# Patient Record
Sex: Female | Born: 1985 | Race: Black or African American | Hispanic: No | Marital: Married | Smoking: Never smoker
Health system: Southern US, Community
[De-identification: ages and names within clinical notes are randomized; demographics above are authoritative.]

## PROBLEM LIST (undated history)

## (undated) ENCOUNTER — Inpatient Hospital Stay (HOSPITAL_COMMUNITY): Payer: Self-pay

## (undated) DIAGNOSIS — R87613 High grade squamous intraepithelial lesion on cytologic smear of cervix (HGSIL): Secondary | ICD-10-CM

## (undated) DIAGNOSIS — E669 Obesity, unspecified: Secondary | ICD-10-CM

## (undated) DIAGNOSIS — Z789 Other specified health status: Secondary | ICD-10-CM

## (undated) DIAGNOSIS — M545 Low back pain, unspecified: Secondary | ICD-10-CM

## (undated) HISTORY — DX: Obesity, unspecified: E66.9

## (undated) HISTORY — DX: High grade squamous intraepithelial lesion on cytologic smear of cervix (HGSIL): R87.613

## (undated) HISTORY — PX: NO PAST SURGERIES: SHX2092

## (undated) HISTORY — DX: Low back pain, unspecified: M54.50

---

## 2015-05-02 ENCOUNTER — Encounter (HOSPITAL_COMMUNITY): Payer: Self-pay | Admitting: Nurse Practitioner

## 2015-05-02 ENCOUNTER — Emergency Department (HOSPITAL_COMMUNITY)
Admission: EM | Admit: 2015-05-02 | Discharge: 2015-05-02 | Disposition: A | Discharge status: Home / Self Care | Payer: Self-pay | Attending: Emergency Medicine | Admitting: Emergency Medicine

## 2015-05-02 DIAGNOSIS — Z3202 Encounter for pregnancy test, result negative: Secondary | ICD-10-CM | POA: Insufficient documentation

## 2015-05-02 DIAGNOSIS — A64 Unspecified sexually transmitted disease: Secondary | ICD-10-CM | POA: Insufficient documentation

## 2015-05-02 LAB — WET PREP, GENITAL
CLUE CELLS WET PREP: NONE SEEN
TRICH WET PREP: NONE SEEN
WBC WET PREP: NONE SEEN
Yeast Wet Prep HPF POC: NONE SEEN

## 2015-05-02 LAB — URINALYSIS, ROUTINE W REFLEX MICROSCOPIC
Bilirubin Urine: NEGATIVE
Glucose, UA: NEGATIVE mg/dL
Hgb urine dipstick: NEGATIVE
Ketones, ur: 15 mg/dL — AB
LEUKOCYTES UA: NEGATIVE
NITRITE: NEGATIVE
PH: 6.5 (ref 5.0–8.0)
Protein, ur: NEGATIVE mg/dL
SPECIFIC GRAVITY, URINE: 1.027 (ref 1.005–1.030)
Urobilinogen, UA: 1 mg/dL (ref 0.0–1.0)

## 2015-05-02 LAB — PREGNANCY, URINE: PREG TEST UR: NEGATIVE

## 2015-05-02 MED ORDER — AZITHROMYCIN 250 MG PO TABS
1000.0000 mg | ORAL_TABLET | Freq: Once | ORAL | Status: AC
  Administered 2015-05-02: 1000 mg via ORAL
  Filled 2015-05-02: qty 4

## 2015-05-02 MED ORDER — CEFTRIAXONE SODIUM 250 MG IJ SOLR
250.0000 mg | Freq: Once | INTRAMUSCULAR | Status: AC
Start: 2015-05-02 — End: 2015-05-02
  Administered 2015-05-02: 250 mg via INTRAMUSCULAR
  Filled 2015-05-02: qty 250

## 2015-05-02 NOTE — ED Provider Notes (Signed)
CSN: [id number]     Arrival date & time 05/02/15  1108 History   First MD Initiated Contact with Patient 05/02/15 1127     Chief Complaint  Patient presents with  . Vaginal Discharge     (Consider location/radiation/quality/duration/timing/severity/associated sxs/prior Treatment) HPI Comments: 29 year-old female with no significant medical history presents with concern for STDs. Patient's husband is also symptomatic. Symptoms for the past few days. No known STD history. 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  Patient is a 29 y.o. female presenting with vaginal discharge. The history is provided by the patient.  Vaginal Discharge Associated symptoms: no abdominal pain, no dysuria, no fever and no vomiting     History reviewed. No pertinent past medical history. History reviewed. No pertinent past surgical history. History reviewed. No pertinent family history. Social History  Substance Use Topics  . Smoking status: Never Smoker   . Smokeless tobacco: None  . Alcohol Use: No   OB History    No data available     Review of Systems  Constitutional: Negative for fever and chills.  Respiratory: Negative for shortness of breath.   Cardiovascular: Negative for chest pain.  Gastrointestinal: Negative for vomiting and abdominal pain.  Genitourinary: Positive for vaginal discharge. Negative for dysuria and flank pain.  Musculoskeletal: Negative for back pain, neck pain and neck stiffness.  Skin: Negative for rash.  Neurological: Negative for light-headedness and headaches.      Allergies  Review of patient's allergies indicates no known allergies.  Home Medications   Prior to Admission medications   Not on File   BP 130/81 mmHg  Pulse 82  Temp(Src) 97.6 F (36.4 C) (Oral)  Resp 14  SpO2 100%  LMP 05/02/2015 (LMP Unknown) Physical Exam  Constitutional: She is oriented to person, place, and time. She appears well-developed and well-nourished.   HENT:  Head: Normocephalic and atraumatic.  Eyes: Right eye exhibits no discharge. Left eye exhibits no discharge.  Neck: No tracheal deviation present.  Cardiovascular: Normal rate.   Pulmonary/Chest: Effort normal.  Abdominal: Soft. She exhibits no distension. There is no tenderness. There is no guarding.  Genitourinary:  Mild vagin dc and CMT  Musculoskeletal: She exhibits no edema.  Neurological: She is alert and oriented to person, place, and time.  Skin: Skin is warm. No rash noted.  Psychiatric: She has a normal mood and affect.  Nursing note and vitals reviewed.   ED Course  Procedures (including critical care time) Labs Review Labs Reviewed  URINALYSIS, ROUTINE W REFLEX MICROSCOPIC (NOT AT [facility]) - Abnormal; Notable for the following:    APPearance CLOUDY (*)    Ketones, ur 15 (*)    All other components within normal limits  WET PREP, GENITAL  URINE CULTURE  PREGNANCY, URINE  RPR  HIV ANTIBODY (ROUTINE TESTING)  GC/CHLAMYDIA PROBE AMP ([place]) NOT AT [facility]    Imaging Review No results found. I have personally reviewed and evaluated these images and lab results as part of my medical decision-making.   EKG Interpretation None      MDM   Final diagnoses:  STD (sexually transmitted disease)   Patient with concern for STD. Treated prophylactically. Follow-up with local medicine and infectious disease. Case manager helping. Results and differential diagnosis were discussed with the patient/parent/guardian. Xrays were independently reviewed by myself.  Close follow up outpatient was discussed, comfortable with the plan.   Medications  cefTRIAXone (ROCEPHIN) injection 250 mg (250 mg Intramuscular Given 05/02/15 1228)  azithromycin [facility]) tablet 1,000 mg (1,000 mg Oral Given 05/02/15 1228)    Filed Vitals:   05/02/15 1123 05/02/15 1348  BP: 128/79 130/81  Pulse: 89 82  Temp: 98.2 F (36.8 C) 97.6 F (36.4 C)  TempSrc: Oral Oral  Resp: 16 14   SpO2: 98% 100%    Final diagnoses:  STD (sexually transmitted disease)        Blane Ohara, MD 05/02/15 1410

## 2015-05-02 NOTE — ED Notes (Addendum)
Horton Finer [phone] case manager will come pick patients up when ready

## 2015-05-02 NOTE — Discharge Instructions (Signed)
Follow up with infectious disease for culture results. If you were given medicines take as directed.  If you are on coumadin or contraceptives realize their levels and effectiveness is altered by many different medicines.  If you have any reaction (rash, tongues swelling, other) to the medicines stop taking and see a physician.    If your blood pressure was elevated in the ER make sure you follow up for management with a primary doctor or return for chest pain, shortness of breath or stroke symptoms.  Please follow up as directed and return to the ER or see a physician for new or worsening symptoms.  Thank you. Filed Vitals:   05/02/15 1123  BP: 128/79  Pulse: 89  Temp: 98.2 F (36.8 C)  TempSrc: Oral  Resp: 16  SpO2: 98%    Emergency Department Resource Guide 1) Find a Doctor and Pay Out of Pocket Although you won't have to find out who is covered by your insurance plan, it is a good idea to ask around and get recommendations. You will then need to call the office and see if the doctor you have chosen will accept you as a new patient and what types of options they offer for patients who are self-pay. Some doctors offer discounts or will set up payment plans for their patients who do not have insurance, but you will need to ask so you aren't surprised when you get to your appointment.  2) Contact Your Local Health Department Not all health departments have doctors that can see patients for sick visits, but many do, so it is worth a call to see if yours does. If you don't know where your local health department is, you can check in your phone book. The CDC also has a tool to help you locate your state's health department, and many state websites also have listings of all of their local health departments.  3) Find a Walk-in Clinic If your illness is not likely to be very severe or complicated, you may want to try a walk in clinic. These are popping up all over the country in pharmacies,  drugstores, and shopping centers. They're usually staffed by nurse practitioners or physician assistants that have been trained to treat common illnesses and complaints. They're usually fairly quick and inexpensive. However, if you have serious medical issues or chronic medical problems, these are probably not your best option.  No Primary Care Doctor: - Call Health Connect at  [phone] - they can help you locate a primary care doctor that  accepts your insurance, provides certain services, etc. - Physician Referral Service- [phone]  Chronic Pain Problems: Organization         Address  Phone   Notes  [facility]  [phone] Patients need to be referred by their primary care doctor.   Medication Assistance: Organization         Address  Phone   Notes  [facility] Medication [facility] [address] [place] [place], [place] [address] [phone] --Must be a resident of [facility] -- Must have NO insurance coverage whatsoever (no Medicaid/ Medicare, etc.) -- The pt. MUST have a primary care doctor that directs their care regularly and follows them in the community   MedAssist  [phone]   [employer]  [phone]    Agencies that provide inexpensive medical care: Organization         Address  Phone   Notes  [facility]  Family [facility]  [phone]) [phone]   [facility]    [phone]   [facility] [address] [place], [place] [address] [phone]) [phone]   [facility] [address], [place] [phone]   [facility]anned Parenthood    [phone]   [facility]    [phone](336) [phone]   [facility]  [address], [place] Phone:  [phone]) [phone], Fax:  [phone] Hours of Operation:  9 am - 6 pm, M-F.  Also accepts Medicaid/Medicare and self-pay.  [facility]  [address], [place] Phone:  [phone]) [phone], Fax: [phone]. Hours of Operation:  8:30 am - 5:30 pm, M-F.  Also accepts Medicaid and self-pay.  [facility] [place] [address], [place] Phone: [phone]) [phone]   [facility] [address], [place], [place] [phone], Ext. [phone] Mondays & Thursdays: 7-9 AM.  First 15 patients are seen on a first come, first serve basis.    Medicaid-accepting [facility] Providers:  Organization         Address  Phone   Notes  [facility] [address], [place] [phone]) [phone] Also accepts self-pay patients.  [facility] [address], [place]  [phone](336) [phone]   [facility] [address], [place] [phone](336) [phone]   [facility] [address] [place] [address], [place] [phone]) [phone]   Renaye RakersVeita Bland [address], [place]   [phone]) [phone] Only accepts [place] Access [place] patients after they have their name applied to their card.   Self-Pay (no insurance) in [facility]:  Organization         Address  Phone   Notes  Sickle Cell Patients, [facility] [address], [place] [phone]) [phone]   [facility] [address], [place] [phone]) [phone]   [facility] [place]  [address], [place] [phone]   [facility]/Dr. Osei-Bonsu  [address] [place] [address], [place] or [id number], [place] [phone]) [phone] Phone number for both [place] and [place] locations is the same.  [facility] [address], [place] [phone]) [phone]   [facility] [address] [place] [address], [place] or [address] [phone]) [phone]   [facility] [address], [place] [phone](336) [phone], phone; [phone]) [phone], fax Sees patients 1st and 3rd Saturday of every month.  Must not qualify for public  or private insurance (i.e. Medicaid, Medicare, [place] Health Choice, Veterans' Benefits)  Household income should be no more than 200% of the poverty level The clinic cannot treat you if you are pregnant or think you are pregnant  Sexually transmitted diseases are not treated at the clinic.    Dental Care: Organization         Address  Phone  Notes  [facility] [address], [place] [phone]) [phone] Accepts children up to age 31 who are enrolled in [place]icaid or [place] Health Choice; pregnant women with a Medicaid card; and children who have applied for Medicaid or [place] Health Choice, but were declined, whose parents can pay a reduced fee at time of service.  [facility] of [facility]  [place]  [address], [place] [phone]) [phone] Accepts children up to age 31 who are enrolled in Medicaid or [place] Health Choice; pregnant women with a Medicaid card; and children who have applied for Medicaid or [place] Health Choice, but were declined, whose parents can pay a reduced fee at time of service.  [facility]  [address], [place] [phone]) [phone] Patients are seen by appointment only. Walk-ins are not accepted. [facility] will see patients 31 years of age and older. Monday - Tuesday (8am-5pm) Most Wednesdays (8:30-5pm) $30 per visit, cash only  [facility]  [address], [facility] [phone]) [phone] Patients are seen by appointment only. Walk-ins are not accepted. [facility] will see patients 31 years of age and older. One Wednesday Evening (Monthly: Volunteer Based).  $30 per visit, cash only  [facility]  [phone]) [phone] for adults; Children under age 724, call Graduate Pediatric Dentistry at [phone]) [phone]. Children aged 184-14, please call [phone]) [phone] to request a pediatric application.  Dental services are provided in all areas of  dental care including fillings, crowns and bridges, complete and partial dentures, implants, gum treatment, root canals, and extractions. Preventive care is also provided. Treatment is provided to both adults and children. Patients are selected via a lottery and there is often a waiting list.   [facility] [address], [place]  [phone]) [phone] [email or web address]   [facility] [address], [place], [place] [phone], Ext. [phone] Second and Fourth Thursday of each month, opens at 6:30 AM; Clinic ends at 9 AM.  Patients are seen on a first-come first-served basis, and a limited number are seen during each clinic.   [facility]  [address], [place], [place] [phone]) [phone]   Eligibility Requirements You must have lived in [place], [place], or [place] counties for at least the last three months.   You cannot be eligible for state or federal sponsored National Cityhealthcare insurance, including CIGNAVeterans Administration, [place]icaid, or [employer]Medicare.   You generally cannot be eligible for healthcare insurance through your employer.    How to apply: Eligibility screenings are held every Tuesday and Wednesday afternoon from 1:00 pm until 4:00 pm. You do not need an appointment for the interview!  [facility] [address], [place], [place] [phone]   [facility] [place] [facility]  [phone]   [facility]  [phone]   [facility]  [phone]    Behavioral Health Resources in the Community: Intensive Outpatient Programs Organization         Address  Phone  Notes  [facility] [address], [place], [place] [phone]   [facility] [address], [place], [place] [phone]   [facility] [address], [place], [place]  [phone]   [facility] [address],  [place], [place] [phone] or  [phone]   Substance Abuse Resources Organization         Address  Phone  Notes  [facility] Drug Services  [phone]   [facility]  [phone]   [facility]  [phone]   [facility]  [phone]   Residential & Outpatient Substance Abuse Program  [phone]   Psychological Services Organization         Address  Phone  Notes  [facility]  [phone]  [phone]   [facility]  [phone]   [facility] [address] [place] [phone] or [phone]    Mobile Crisis Teams Organization         Address  Phone  Notes  [facility], Mobile Crisis Care Unit  [phone]   [facility]  [address] [place], [place] [phone]   Doristine Locks [address] [place] [phone]    Self-Help/Support Groups Organization         Address  Phone             Notes  Mental Health Assoc. of  - variety of support groups  336- I7437963 Call for more information  Narcotics Anonymous (NA), [facility]aring [facility] [address], [employer] [place]  2 meetings at this location   Statistician         Address  Phone  Notes  [facility] [address],    [place]  [phone]   [facility]  [address] [place] [address], [place], [place] [phone]   [facility] [address] [place], [place] [phone] Admissions: 8am-3pm M-F  [facility] [address]    [place], [place] [phone]   [facility] [address] [place], [place], [place] [phone]   [facility] [address],  [place], [place] [phone]   [facility] - Intensive Outpatient [address], [place], [place] [phone]   [facility] ([facility] [address] [place]  [place], [place] [phone] or [phone]   Residential  Treatment Services (RTS) [address], [place], [place] [phone] Accepts Medicaid  [facility] [address],  [place] [phone] Substance Abuse/Addiction Treatment   [facility] Organization         Address  Phone  Notes  [facility]  [phone]   Angie Fava, PhD [address] [place] [employer] [employer], [place]   [phone] or [phone]   [facility]   [address] [place], [place] [phone]   [facility] [address] [place], [place] [phone] Insurance/Medicaid/sponsorship through [facility] and Families [address]                                    [place], [place] [phone] Therapy/tele-psych/case  [facility] [address], [place] [phone]    Dr. Lolly Mustache  [phone]   [facility]  [employer] [facility] Dept. 1) [address] [place] 2) [address], [place] 3)  [address], [place] [phone]  [phone]   [facility] Child Abuse Hotline (986)742-5806 or [phone] (After Hours)

## 2015-05-02 NOTE — ED Notes (Signed)
She c/o painful intercourse, vaginal discharge and urinary burning x 3 days. She denies pain right now

## 2015-05-03 LAB — RPR: RPR: NONREACTIVE

## 2015-05-03 LAB — HIV ANTIBODY (ROUTINE TESTING W REFLEX): HIV SCREEN 4TH GENERATION: NONREACTIVE

## 2015-05-04 LAB — URINE CULTURE

## 2015-05-05 LAB — GC/CHLAMYDIA PROBE AMP (~~LOC~~) NOT AT ARMC
Chlamydia: POSITIVE — AB
NEISSERIA GONORRHEA: NEGATIVE

## 2015-05-06 ENCOUNTER — Telehealth (HOSPITAL_COMMUNITY): Payer: Self-pay | Admitting: Emergency Medicine

## 2015-05-06 NOTE — Telephone Encounter (Signed)
Positive Chlamydia Treated with Rocephin and Zithromax DHHS faxed  Unable to contact patient by phone, no number given in epic. Letter sent.

## 2015-05-14 ENCOUNTER — Telehealth (HOSPITAL_BASED_OUTPATIENT_CLINIC_OR_DEPARTMENT_OTHER): Payer: Self-pay | Admitting: Emergency Medicine

## 2015-05-14 NOTE — Telephone Encounter (Signed)
Letter returned to sender, no known forwarding address, lost to followup

## 2015-05-22 ENCOUNTER — Telehealth (HOSPITAL_COMMUNITY): Payer: Self-pay

## 2015-05-22 NOTE — Telephone Encounter (Signed)
Unable to contact pt by mail or telephone. Unable to communicate lab results or treatment changes. 

## 2016-07-18 ENCOUNTER — Emergency Department (HOSPITAL_COMMUNITY): Payer: Medicaid Other

## 2016-07-18 ENCOUNTER — Encounter (HOSPITAL_COMMUNITY): Payer: Self-pay | Admitting: *Deleted

## 2016-07-18 ENCOUNTER — Emergency Department (HOSPITAL_COMMUNITY)
Admission: EM | Admit: 2016-07-18 | Discharge: 2016-07-18 | Disposition: A | Discharge status: Home / Self Care | Payer: Medicaid Other | Attending: Emergency Medicine | Admitting: Emergency Medicine

## 2016-07-18 DIAGNOSIS — R102 Pelvic and perineal pain: Secondary | ICD-10-CM

## 2016-07-18 DIAGNOSIS — O0281 Inappropriate change in quantitative human chorionic gonadotropin (hCG) in early pregnancy: Secondary | ICD-10-CM | POA: Diagnosis not present

## 2016-07-18 DIAGNOSIS — Z3A18 18 weeks gestation of pregnancy: Secondary | ICD-10-CM | POA: Diagnosis not present

## 2016-07-18 DIAGNOSIS — O26892 Other specified pregnancy related conditions, second trimester: Secondary | ICD-10-CM | POA: Diagnosis present

## 2016-07-18 DIAGNOSIS — O210 Mild hyperemesis gravidarum: Secondary | ICD-10-CM | POA: Insufficient documentation

## 2016-07-18 DIAGNOSIS — O219 Vomiting of pregnancy, unspecified: Secondary | ICD-10-CM

## 2016-07-18 DIAGNOSIS — O26899 Other specified pregnancy related conditions, unspecified trimester: Secondary | ICD-10-CM

## 2016-07-18 LAB — COMPREHENSIVE METABOLIC PANEL
ALT: 13 U/L — ABNORMAL LOW (ref 14–54)
AST: 25 U/L (ref 15–41)
Albumin: 3.3 g/dL — ABNORMAL LOW (ref 3.5–5.0)
Alkaline Phosphatase: 63 U/L (ref 38–126)
Anion gap: 9 (ref 5–15)
BILIRUBIN TOTAL: 0.5 mg/dL (ref 0.3–1.2)
BUN: 8 mg/dL (ref 6–20)
CHLORIDE: 108 mmol/L (ref 101–111)
CO2: 19 mmol/L — ABNORMAL LOW (ref 22–32)
CREATININE: 0.57 mg/dL (ref 0.44–1.00)
Calcium: 9.2 mg/dL (ref 8.9–10.3)
Glucose, Bld: 110 mg/dL — ABNORMAL HIGH (ref 65–99)
POTASSIUM: 3.2 mmol/L — AB (ref 3.5–5.1)
Sodium: 136 mmol/L (ref 135–145)
TOTAL PROTEIN: 7.1 g/dL (ref 6.5–8.1)

## 2016-07-18 LAB — CBC
HEMATOCRIT: 35.5 % — AB (ref 36.0–46.0)
Hemoglobin: 12.5 g/dL (ref 12.0–15.0)
MCH: 28.3 pg (ref 26.0–34.0)
MCHC: 35.2 g/dL (ref 30.0–36.0)
MCV: 80.3 fL (ref 78.0–100.0)
PLATELETS: 152 10*3/uL (ref 150–400)
RBC: 4.42 MIL/uL (ref 3.87–5.11)
RDW: 13.3 % (ref 11.5–15.5)
WBC: 11.4 10*3/uL — AB (ref 4.0–10.5)

## 2016-07-18 LAB — URINALYSIS, ROUTINE W REFLEX MICROSCOPIC
Bilirubin Urine: NEGATIVE
GLUCOSE, UA: NEGATIVE mg/dL
Hgb urine dipstick: NEGATIVE
LEUKOCYTES UA: NEGATIVE
NITRITE: NEGATIVE
PROTEIN: NEGATIVE mg/dL
Specific Gravity, Urine: 1.012 (ref 1.005–1.030)
pH: 6.5 (ref 5.0–8.0)

## 2016-07-18 LAB — I-STAT BETA HCG BLOOD, ED (MC, WL, AP ONLY): I-stat hCG, quantitative: >2000 m[IU]/mL — ABNORMAL HIGH (ref <5)

## 2016-07-18 LAB — HCG, QUANTITATIVE, PREGNANCY: HCG, BETA CHAIN, QUANT, S: 22475 m[IU]/mL — AB (ref <5)

## 2016-07-18 LAB — LIPASE, BLOOD: LIPASE: 28 U/L (ref 11–51)

## 2016-07-18 MED ORDER — PYRIDOXINE HCL 25 MG PO TABS: 25.0000 mg | ORAL_TABLET | Freq: Three times a day (TID) | ORAL | 0 refills | Status: DC

## 2016-07-18 MED ORDER — MECLIZINE HCL 25 MG PO TABS: 25.0000 mg | ORAL_TABLET | Freq: Three times a day (TID) | ORAL | 1 refills | Status: DC | PRN

## 2016-07-18 MED ORDER — DIPHENHYDRAMINE HCL 25 MG PO TABS: 25.0000 mg | ORAL_TABLET | Freq: Four times a day (QID) | ORAL | 1 refills | Status: DC | PRN

## 2016-07-18 MED ORDER — DIPHENHYDRAMINE HCL 50 MG/ML IJ SOLN
25.0000 mg | Freq: Once | INTRAMUSCULAR | Status: AC
  Administered 2016-07-18: 25 mg via INTRAVENOUS
  Filled 2016-07-18: qty 1

## 2016-07-18 MED ORDER — METOCLOPRAMIDE HCL 5 MG/ML IJ SOLN
10.0000 mg | Freq: Once | INTRAMUSCULAR | Status: AC
  Administered 2016-07-18: 10 mg via INTRAVENOUS
  Filled 2016-07-18: qty 2

## 2016-07-18 MED ORDER — SODIUM CHLORIDE 0.9 % IV BOLUS (SEPSIS)
1000.0000 mL | Freq: Once | INTRAVENOUS | Status: AC
  Administered 2016-07-18: 1000 mL via INTRAVENOUS

## 2016-07-18 MED ORDER — PYRIDOXINE HCL 100 MG/ML IJ SOLN
100.0000 mg | Freq: Once | INTRAMUSCULAR | Status: AC
  Administered 2016-07-18: 100 mg via INTRAVENOUS
  Filled 2016-07-18: qty 1

## 2016-07-18 MED ORDER — MECLIZINE HCL 25 MG PO TABS
25.0000 mg | ORAL_TABLET | Freq: Once | ORAL | Status: AC
  Administered 2016-07-18: 25 mg via ORAL
  Filled 2016-07-18: qty 1

## 2016-07-18 NOTE — ED Notes (Signed)
Patient transported to Ultrasound 

## 2016-07-18 NOTE — ED Triage Notes (Signed)
Uses translator service, pt reports being approx 4 months pregnant and having severe n/v. Pt is fatigued and having abd pain, is afraid that she will have miscarriage. This is pt's 5th pregnancy.

## 2016-07-18 NOTE — ED Provider Notes (Signed)
[facility]-EMERGENCY DEPT Provider Note   CSN: [id number] Arrival date & time: 07/18/16  1512     History   Chief Complaint Chief Complaint  Patient presents with  . Emesis  . Abdominal Pain    HPI Deborah Santana is a 30 y.o. female.  Deborah Santana is a 30 y.o. Female who presents to the ED complaining of LLQ abdominal pain, nausea and vomiting since today. She reports she is about 4 months pregnant she believes. Her last menstrual cycle was July 20th of this year. She reports no prenatal care and has had no confirmed IUP. She reports today she began having nausea and vomiting and left sided lower abdominal pain. She denies any vaginal bleeding or vaginal discharge. She tells me she believes she will have a miscarriage. She does report her pain came on gradually. She reports multiple episodes of nausea and vomiting. Patient denies fevers, urinary symptoms, vaginal bleeding, vaginal discharge,coughing or rashes.    The history is provided by the patient and the spouse. The history is limited by a language barrier. A language interpreter was used.  Emesis   Associated symptoms include abdominal pain. Pertinent negatives include no cough, no diarrhea, no fever and no headaches.  Abdominal Pain   Associated symptoms include nausea and vomiting. Pertinent negatives include fever, diarrhea, dysuria, frequency, hematuria and headaches.    History reviewed. No pertinent past medical history.  There are no active problems to display for this patient.   History reviewed. No pertinent surgical history.  OB History    Gravida Para Term Preterm AB Living   5             SAB TAB Ectopic Multiple Live Births                   Home Medications    Prior to Admission medications   Medication Sig Start Date End Date Taking? Authorizing Provider  diphenhydrAMINE (BENADRYL) 25 MG tablet Take 1 tablet (25 mg total) by mouth every 6 (six) hours as needed (Rash). 07/18/16   Everlene FarrierWilliam Lexys Milliner, PA-C    meclizine (ANTIVERT) 25 MG tablet Take 1 tablet (25 mg total) by mouth 3 (three) times daily as needed for nausea. 07/18/16   Everlene FarrierWilliam Ashey Tramontana, PA-C  pyridOXINE (VITAMIN B-6) 25 MG tablet Take 1 tablet (25 mg total) by mouth 3 (three) times daily. 07/18/16   Everlene FarrierWilliam Sudeep Scheibel, PA-C    Family History History reviewed. No pertinent family history.  Social History Social History  Substance Use Topics  . Smoking status: Never Smoker  . Smokeless tobacco: Not on file  . Alcohol use No     Allergies   Patient has no known allergies.   Review of Systems Review of Systems  Constitutional: Negative for fever.  HENT: Negative for congestion and sore throat.   Eyes: Negative for visual disturbance.  Respiratory: Negative for cough and shortness of breath.   Cardiovascular: Negative for chest pain.  Gastrointestinal: Positive for abdominal pain, nausea and vomiting. Negative for blood in stool and diarrhea.  Genitourinary: Negative for dysuria, frequency, hematuria, urgency, vaginal bleeding and vaginal discharge.  Musculoskeletal: Negative for back pain and neck pain.  Skin: Negative for rash.  Neurological: Negative for headaches.     Physical Exam Updated Vital Signs BP 112/67   Pulse 84   Temp 98.1 F (36.7 C) (Oral)   Resp 18   SpO2 100%   Physical Exam  Constitutional: She appears well-developed and well-nourished. No distress.  Nontoxic  appearing.  HENT:  Head: Normocephalic and atraumatic.  Mouth/Throat: Oropharynx is clear and moist.  Eyes: Conjunctivae are normal. Pupils are equal, round, and reactive to light. Right eye exhibits no discharge. Left eye exhibits no discharge.  Neck: Neck supple.  Cardiovascular: Normal rate, regular rhythm, normal heart sounds and intact distal pulses.  Exam reveals no gallop and no friction rub.   No murmur heard. Pulmonary/Chest: Effort normal and breath sounds normal. No respiratory distress. She has no wheezes. She has no rales.   Abdominal: Soft. Bowel sounds are normal. She exhibits mass. She exhibits no distension. There is tenderness.  Abdomen is soft. Bowel sounds are present. Patient has a gravid abdomen with fundus height just below the umbilicus. There is mild LLQ TTP. No RLQ TTP.   Musculoskeletal: She exhibits no edema.  Lymphadenopathy:    She has no cervical adenopathy.  Neurological: She is alert. Coordination normal.  Skin: Skin is warm and dry. No rash noted. She is not diaphoretic. No erythema. No pallor.  Psychiatric: She has a normal mood and affect. Her behavior is normal.  Nursing note and vitals reviewed.    ED Treatments / Results  Labs (all labs ordered are listed, but only abnormal results are displayed) Labs Reviewed  COMPREHENSIVE METABOLIC PANEL - Abnormal; Notable for the following:       Result Value   Potassium 3.2 (*)    CO2 19 (*)    Glucose, Bld 110 (*)    Albumin 3.3 (*)    ALT 13 (*)    All other components within normal limits  CBC - Abnormal; Notable for the following:    WBC 11.4 (*)    HCT 35.5 (*)    All other components within normal limits  URINALYSIS, ROUTINE W REFLEX MICROSCOPIC (NOT AT [facility]) - Abnormal; Notable for the following:    APPearance CLOUDY (*)    Ketones, ur >80 (*)    All other components within normal limits  HCG, QUANTITATIVE, PREGNANCY - Abnormal; Notable for the following:    hCG, Beta Chain, Quant, S 22,475 (*)    All other components within normal limits  I-STAT BETA HCG BLOOD, ED ([facility], [facility], [facility] ONLY) - Abnormal; Notable for the following:    I-stat hCG, quantitative >2,000.0 (*)    All other components within normal limits  LIPASE, BLOOD    EKG  EKG Interpretation None       Radiology US Ob Limited  Result Date: 07/18/2016 CLINICAL DATA:  Pelvic pain with nausea and vomiting. Second trimester pregnancy. EXAM: LIMITED OBSTETRIC ULTRASOUND FINDINGS: Number of Fetuses:  1 Heart Rate:  150 bpm Movement:  Yes Presentation: Variable  Placental Location: Posterior Previa: No Amniotic Fluid (Subjective):  Within normal limits. BPD:  4.0cm 18w  0d MATERNAL FINDINGS: Cervix: Appears closed. Measures 4.1 cm in length transabdominally. Uterus/Adnexae:  No abnormality visualized. IMPRESSION: Single living intrauterine fetus at approximately 18 weeks. No acute maternal findings visualized. This exam is performed on an emergent basis and does not comprehensively evaluate fetal size, dating, or anatomy; follow-up complete OB US should be considered if further fetal assessment is warranted. Electronically Signed   By: Myles Rosenthal M.D.   On: 07/18/2016 18:54    Procedures Procedures (including critical care time)  Medications Ordered in ED Medications  sodium chloride 0.9 % bolus 1,000 mL (0 mLs Intravenous Stopped 07/18/16 2031)  diphenhydrAMINE (BENADRYL) injection 25 mg (25 mg Intravenous Given 07/18/16 1651)  metoCLOPramide (REGLAN) injection 10 mg (10  mg Intravenous Given 07/18/16 1651)  pyridOXINE (B-6) injection 100 mg (100 mg Intravenous Given 07/18/16 2028)  sodium chloride 0.9 % bolus 1,000 mL (0 mLs Intravenous Stopped 07/18/16 2205)  meclizine (ANTIVERT) tablet 25 mg (25 mg Oral Given 07/18/16 2028)     Initial Impression / Assessment and Plan / ED Course  I have reviewed the triage vital signs and the nursing notes.  Pertinent labs & imaging results that were available during my care of the patient were reviewed by me and considered in my medical decision making (see chart for details).  Clinical Course    This is a 30 y.o. Female who presents to the ED complaining of LLQ abdominal pain, nausea and vomiting since today. She reports she is about 4 months pregnant she believes. Her last menstrual cycle was July 20th of this year. She reports no prenatal care and has had no confirmed IUP. She reports today she began having nausea and vomiting and left sided lower abdominal pain. She denies any vaginal bleeding or vaginal  discharge. She tells me she believes she will have a miscarriage. She does report her pain came on gradually. She reports multiple episodes of nausea and vomiting. Patient denies fevers, urinary symptoms, vaginal bleeding, vaginal discharge.  On exam the patient is afebrile nontoxic appearing. She has a gravid abdomen with her fundal height just below the level of her umbilicus. She has some mild left lower quadrant abdominal tenderness to palpation. No CVA or flank tenderness. Intrauterine pregnancy confirmed OB ultrasound that shows a single live intrauterine fetus at approximately [redacted] weeks gestation. No acute maternal findings visualized. CBC is remarkable only for white count of 11,000. His CMP is unremarkable. Lipase is within normal limits. Patient received Benadryl, Reglan and a fluid bolus initially to help with her nausea. At recheck patient is still having some vomiting. Patient was then provided with vitamin B 6 and second fluid bolus.  At recheck patient is finally feeling better. She reports decreased appetite but is able to tolerate by mouth. We will discharge her with vitamin B6 and Benadryl for nausea. I encouraged her to follow-up with the women's outpatient clinic for recheck. I discussed return precautions. I advised the patient to follow-up with their primary care provider this week. I advised the patient to return to the emergency department with new or worsening symptoms or new concerns. The patient verbalized understanding and agreement with plan.    This patient was discussed with and evaluated by Dr. Dalene SeltzerSchlossman who agrees with assessment and plan.   Final Clinical Impressions(s) / ED Diagnoses   Final diagnoses:  Hyperemesis gravidarum  [redacted] weeks gestation of pregnancy    New Prescriptions Discharge Medication List as of 07/18/2016 11:29 PM    START taking these medications   Details  meclizine (ANTIVERT) 25 MG tablet Take 1 tablet (25 mg total) by mouth 3 (three) times  daily as needed for nausea., Starting Sun 07/18/2016, Print    pyridOXINE (VITAMIN B-6) 25 MG tablet Take 1 tablet (25 mg total) by mouth 3 (three) times daily., Starting Sun 07/18/2016, Print         Everlene FarrierWilliam Jumana Paccione, PA-C 07/19/16 [id number]    Alvira MondayErin Schlossman, MD 07/19/16 1250

## 2016-07-18 NOTE — ED Notes (Signed)
Pt vomiting; meclizine pill visible in emesis bag; Will PA  notified

## 2016-08-11 ENCOUNTER — Encounter: Payer: Self-pay | Admitting: Obstetrics and Gynecology

## 2016-08-25 ENCOUNTER — Encounter: Payer: Self-pay | Admitting: Family Medicine

## 2016-08-25 ENCOUNTER — Other Ambulatory Visit (HOSPITAL_COMMUNITY)
Admission: RE | Admit: 2016-08-25 | Discharge: 2016-08-25 | Disposition: A | Discharge status: Home / Self Care | Payer: Medicaid Other | Source: Ambulatory Visit | Attending: Obstetrics and Gynecology | Admitting: Obstetrics and Gynecology

## 2016-08-25 ENCOUNTER — Encounter: Payer: Self-pay | Admitting: Obstetrics and Gynecology

## 2016-08-25 ENCOUNTER — Ambulatory Visit (INDEPENDENT_AMBULATORY_CARE_PROVIDER_SITE_OTHER): Payer: Medicaid Other | Admitting: Obstetrics and Gynecology

## 2016-08-25 DIAGNOSIS — Z113 Encounter for screening for infections with a predominantly sexual mode of transmission: Secondary | ICD-10-CM | POA: Insufficient documentation

## 2016-08-25 DIAGNOSIS — Z3492 Encounter for supervision of normal pregnancy, unspecified, second trimester: Secondary | ICD-10-CM

## 2016-08-25 DIAGNOSIS — Z1151 Encounter for screening for human papillomavirus (HPV): Secondary | ICD-10-CM | POA: Diagnosis present

## 2016-08-25 DIAGNOSIS — Z23 Encounter for immunization: Secondary | ICD-10-CM | POA: Diagnosis present

## 2016-08-25 DIAGNOSIS — Z3490 Encounter for supervision of normal pregnancy, unspecified, unspecified trimester: Secondary | ICD-10-CM | POA: Insufficient documentation

## 2016-08-25 DIAGNOSIS — Z01411 Encounter for gynecological examination (general) (routine) with abnormal findings: Secondary | ICD-10-CM | POA: Insufficient documentation

## 2016-08-25 LAB — POCT URINALYSIS DIP (DEVICE)
Bilirubin Urine: NEGATIVE
GLUCOSE, UA: NEGATIVE mg/dL
HGB URINE DIPSTICK: NEGATIVE
Ketones, ur: NEGATIVE mg/dL
LEUKOCYTES UA: NEGATIVE
NITRITE: NEGATIVE
PROTEIN: NEGATIVE mg/dL
Specific Gravity, Urine: 1.015 (ref 1.005–1.030)
UROBILINOGEN UA: 0.2 mg/dL (ref 0.0–1.0)
pH: 7.5 (ref 5.0–8.0)

## 2016-08-25 NOTE — Progress Notes (Signed)
Here for first prenatal visit. Used [employer].

## 2016-08-25 NOTE — Progress Notes (Signed)
New OB Note  08/25/2016   [facility]  Chief Complaint: NOB  Transfer of Care Patient: no  History of Present Illness: Ms. Deborah Santana is a 30 y.o. [id number] @ 23/3 weeks (EDC 4/8, based on 18wk u/s) Patient's last menstrual period was 03/26/2016.).  Preg complicated by has Supervision of low-risk pregnancy on her problem list.   Her periods were: regular, qmonth She was using no method when she conceived.  She has Negative signs or symptoms of nausea/vomiting of pregnancy. She has Negative signs or symptoms of miscarriage or preterm labor  ROS: A 12-point review of systems was performed and negative, except as stated in the above HPI.  OBGYN History: As per HPI. OB History  Gravida Para Term Preterm AB Living  5 4 4     4   SAB TAB Ectopic Multiple Live Births          4    # Outcome Date GA Lbr Len/2nd Weight Sex Delivery Anes PTL Lv  5 Current           4 Term 2015 5566w0d  6 lb 2.8 oz (2.8 kg)  Vag-Spont   LIV     Birth Comments: born in [place], no complications.   3 Term 2011 7666w0d  7 lb 11.5 oz (3.5 kg)  Vag-Spont   LIV     Birth Comments: born in [place],no complications  2 Term 2008 5066w0d  8 lb 6 oz (3.8 kg)  Vag-Spont   LIV     Birth Comments: born in [place], no complications.  1 Term 2006 3866w0d  8 lb 9.6 oz (3.9 kg)  Vag-Spont   LIV     Birth Comments: no complications, born in [place]     Any issues with any prior pregnancies: no Any prior children are healthy, doing well, without any problems or issues: yes   Past Medical History: No past medical history on file.  Past Surgical History: History reviewed. No pertinent surgical history.  Family History:  History reviewed. No pertinent family history.  Social History:  Social History   Social History  . Marital status: Married    Spouse name: N/A  . Number of children: N/A  . Years of education: N/A   Occupational History  . Not on file.   Social History Main Topics  . Smoking  status: Never Smoker  . Smokeless tobacco: Never Used  . Alcohol use No  . Drug use: No  . Sexual activity: Yes    Birth control/ protection: None   Other Topics Concern  . Not on file   Social History Narrative  . No narrative on file   Works at [employer] on her feet a lot  Allergy: No Known Allergies  Health Maintenance:  Mammogram Up to Date: not applicable  Current Outpatient Medications: PNV  Physical Exam:   BP (!) 111/49   Pulse 71   Wt 161 lb (73 kg)   LMP 03/26/2016  There is no height or weight on file to calculate BMI. Fundal height: 25 FHTs: 160  General appearance: Well nourished, well developed female in no acute distress.  Neck:  Supple, normal appearance, and no thyromegaly  Cardiovascular: S1, S2 normal, no murmur, rub or gallop, regular rate and rhythm Respiratory:  Clear to auscultation bilateral. Normal respiratory effort Abdomen: positive bowel sounds and no masses, hernias; diffusely non tender to palpation, non distended Breasts: breasts appear normal, no suspicious masses, no skin or nipple changes or axillary  nodes, and normal inspection. Neuro/Psych:  Normal mood and affect.  Skin:  Warm and dry.  Lymphatic:  No inguinal lymphadenopathy.   Pelvic exam: is not limited by body habitus EGBUS: within normal limits, Vagina: within normal limits and with no blood in the vault, Cervix: normal appearing cervix without discharge or lesions, closed/long/high, Uterus:  enlarged, c/w 24 week size, and Adnexa:  normal adnexa and no mass, fullness, tenderness  Laboratory: none  Imaging:  18wk u/s reviewed  Assessment: pt doing well  Plan: 1. Encounter for supervision of low-risk pregnancy, antepartum Routine care. 2hr GTT nv. Pt told to be fasting. Anatomy u/s scheduled for next week. Flu shot today - Prenatal Profile - Pain Mgmt, Profile 6 Conf w/o mM, U - Hemoglobinopathy Evaluation - GC/Chlamydia probe amp ([place] - Flu  Vaccine QUAD 36+ mos IM - Culture, OB Urine - US MFM OB COMP + 14 WK; Future - Cystic fibrosis diagnostic study - Cytology - PAP  Interpreter used.   Problem list reviewed and updated.  Follow up in 3-4 weeks.  >50% of 25 min visit spent on counseling and coordination of care.     Cornelia Copaharlie Bassam Dresch, Jr. MD Attending [facility](Faculty Practice)

## 2016-08-26 LAB — PRENATAL PROFILE (SOLSTAS)
ANTIBODY SCREEN: NEGATIVE
BASOS PCT: 0 %
Basophils Absolute: 0 cells/uL (ref 0–200)
EOS ABS: 288 {cells}/uL (ref 15–500)
Eosinophils Relative: 4 %
HEMATOCRIT: 33.1 % — AB (ref 35.0–45.0)
HEMOGLOBIN: 11.1 g/dL — AB (ref 11.7–15.5)
HIV 1&2 Ab, 4th Generation: NONREACTIVE
Hepatitis B Surface Ag: NEGATIVE
Lymphocytes Relative: 28 %
Lymphs Abs: 2016 cells/uL (ref 850–3900)
MCH: 28.3 pg (ref 27.0–33.0)
MCHC: 33.5 g/dL (ref 32.0–36.0)
MCV: 84.4 fL (ref 80.0–100.0)
MONOS PCT: 8 %
MPV: 11.2 fL (ref 7.5–12.5)
Monocytes Absolute: 576 cells/uL (ref 200–950)
NEUTROS ABS: 4320 {cells}/uL (ref 1500–7800)
Neutrophils Relative %: 60 %
PLATELETS: 150 10*3/uL (ref 140–400)
RBC: 3.92 MIL/uL (ref 3.80–5.10)
RDW: 14.3 % (ref 11.0–15.0)
Rh Type: POSITIVE
Rubella: 16.2 Index — ABNORMAL HIGH (ref <0.90)
WBC: 7.2 10*3/uL (ref 3.8–10.8)

## 2016-08-26 LAB — PAIN MGMT, PROFILE 6 CONF W/O MM, U
6 Acetylmorphine: NEGATIVE ng/mL (ref <10)
ALCOHOL METABOLITES: NEGATIVE ng/mL (ref <500)
Amphetamines: NEGATIVE ng/mL (ref <500)
Barbiturates: NEGATIVE ng/mL (ref <300)
Benzodiazepines: NEGATIVE ng/mL (ref <100)
Cocaine Metabolite: NEGATIVE ng/mL (ref <150)
Creatinine: 115.4 mg/dL (ref >=20.0)
MARIJUANA METABOLITE: NEGATIVE ng/mL (ref <20)
Methadone Metabolite: NEGATIVE ng/mL (ref <100)
OXIDANT: NEGATIVE ug/mL (ref <200)
Opiates: NEGATIVE ng/mL (ref <100)
Oxycodone: NEGATIVE ng/mL (ref <100)
PH: 7.19 (ref 4.5–9.0)
PLEASE NOTE: 0
Phencyclidine: NEGATIVE ng/mL (ref <25)

## 2016-08-26 LAB — GC/CHLAMYDIA PROBE AMP (~~LOC~~) NOT AT ARMC
CHLAMYDIA, DNA PROBE: NEGATIVE
NEISSERIA GONORRHEA: NEGATIVE

## 2016-08-26 LAB — CULTURE, OB URINE: Organism ID, Bacteria: NO GROWTH

## 2016-08-27 ENCOUNTER — Encounter: Payer: Self-pay | Admitting: Obstetrics and Gynecology

## 2016-08-27 DIAGNOSIS — Z789 Other specified health status: Secondary | ICD-10-CM | POA: Insufficient documentation

## 2016-08-27 DIAGNOSIS — Z603 Acculturation difficulty: Secondary | ICD-10-CM | POA: Insufficient documentation

## 2016-08-27 LAB — HEMOGLOBINOPATHY EVALUATION
HCT: 33.1 % — ABNORMAL LOW (ref 35.0–45.0)
HGB A2 QUANT: 2.9 % (ref 1.8–3.5)
Hemoglobin: 11.1 g/dL — ABNORMAL LOW (ref 11.7–15.5)
Hgb A: 96.1 % (ref >96.0)
MCH: 28.3 pg (ref 27.0–33.0)
MCV: 84.4 fL (ref 80.0–100.0)
RBC: 3.92 MIL/uL (ref 3.80–5.10)
RDW: 14.3 % (ref 11.0–15.0)

## 2016-08-30 LAB — CYTOLOGY - PAP
DIAGNOSIS: UNDETERMINED — AB
HPV: DETECTED — AB

## 2016-08-30 LAB — CYSTIC FIBROSIS DIAGNOSTIC STUDY

## 2016-08-31 ENCOUNTER — Encounter: Payer: Self-pay | Admitting: Obstetrics and Gynecology

## 2016-08-31 DIAGNOSIS — R87610 Atypical squamous cells of undetermined significance on cytologic smear of cervix (ASC-US): Secondary | ICD-10-CM | POA: Insufficient documentation

## 2016-08-31 DIAGNOSIS — R87810 Cervical high risk human papillomavirus (HPV) DNA test positive: Secondary | ICD-10-CM

## 2016-08-31 DIAGNOSIS — R87613 High grade squamous intraepithelial lesion on cytologic smear of cervix (HGSIL): Secondary | ICD-10-CM | POA: Insufficient documentation

## 2016-09-01 ENCOUNTER — Other Ambulatory Visit: Payer: Self-pay | Admitting: Obstetrics and Gynecology

## 2016-09-01 ENCOUNTER — Ambulatory Visit (HOSPITAL_COMMUNITY)
Admission: RE | Admit: 2016-09-01 | Discharge: 2016-09-01 | Disposition: A | Discharge status: Home / Self Care | Payer: Medicaid Other | Source: Ambulatory Visit | Attending: Obstetrics and Gynecology | Admitting: Obstetrics and Gynecology

## 2016-09-01 DIAGNOSIS — Z3A24 24 weeks gestation of pregnancy: Secondary | ICD-10-CM

## 2016-09-01 DIAGNOSIS — Z3490 Encounter for supervision of normal pregnancy, unspecified, unspecified trimester: Secondary | ICD-10-CM

## 2016-09-01 DIAGNOSIS — Z363 Encounter for antenatal screening for malformations: Secondary | ICD-10-CM | POA: Insufficient documentation

## 2016-09-13 NOTE — L&D Delivery Note (Signed)
Delivery Note Admitted in active labor w/ SROM. Progressed rapidly and pushed x 1 uc and at 6:28 AM a viable female was delivered via Vaginal, Spontaneous Delivery (Presentation:LOA). Shoulders were not forthcoming, pt placed in Mc[employer]'s then suprapubic pressure which released anterior shoulder. Shoulder dystocia lasted ~30 seconds. Infant then placed on maternal abdomen and dried/stimulated, poor tone/color/resp so cord clamped x 2 and cut by me and infant taken to radiant warmer- spontaneously cried in transit with resulting improvement in tone/resp, requiring some blow-by O2 to keep sats up. Moving both arms bilaterally.  APGAR: see delivery summary. Weight: pending at time of note .   Placenta status: delivered spontaneously intact, marginal insertion.  Cord: 3VC,  with the following complications: none.  Cord pH: not done  Anesthesia:  1 dose IV fentanyl ~7mins prior to birth Episiotomy: None Lacerations: None Suture Repair: n/a Est. Blood Loss (mL): 50  Mom to postpartum.  Baby to Couplet care / Skin to Skin. Breastfeeding, plans depo  Marge Duncans 12/22/2016, 6:36 AM

## 2016-09-15 ENCOUNTER — Encounter: Payer: Self-pay | Admitting: Advanced Practice Midwife

## 2016-09-15 ENCOUNTER — Ambulatory Visit (INDEPENDENT_AMBULATORY_CARE_PROVIDER_SITE_OTHER): Payer: Medicaid Other | Admitting: Advanced Practice Midwife

## 2016-09-15 VITALS — BP 106/50 | HR 82 | Wt 171.0 lb

## 2016-09-15 DIAGNOSIS — Z3492 Encounter for supervision of normal pregnancy, unspecified, second trimester: Secondary | ICD-10-CM

## 2016-09-15 DIAGNOSIS — R87810 Cervical high risk human papillomavirus (HPV) DNA test positive: Secondary | ICD-10-CM

## 2016-09-15 DIAGNOSIS — Z3482 Encounter for supervision of other normal pregnancy, second trimester: Secondary | ICD-10-CM

## 2016-09-15 DIAGNOSIS — R87610 Atypical squamous cells of undetermined significance on cytologic smear of cervix (ASC-US): Secondary | ICD-10-CM

## 2016-09-15 MED ORDER — COMFORT FIT MATERNITY SUPP MED MISC: 1.0000 | Freq: Every day | 0 refills | Status: DC

## 2016-09-15 NOTE — Patient Instructions (Signed)
[facility]  [address]  [place], [place] [address]  Colposcopy Colposcopy is a procedure to examine the lowest part of the uterus (cervix), the vagina, and the area around the vaginal opening (vulva) for abnormalities or signs of disease. The procedure is done using a lighted microscope or magnifying lens (colposcope). If any unusual cells are found during the procedure, your health care provider may remove a tissue sample for testing (biopsy). A colposcopy may be done if you:  Have an abnormal Pap test. A Pap test is a screening test that is used to check for signs of cancer or infection of the vagina, cervix, and uterus.  Have a Pap smear test in which you test positive for high-risk HPV (human papillomavirus).  Have a sore or lesion on your cervix.  Have genital warts on your vulva, vagina, or cervix.  Took certain medicines while pregnant, such as diethylstilbestrol (DES).  Have pain during sexual intercourse.  Have vaginal bleeding, especially after sexual intercourse.  Need to have a cervical polyp removed.  Need to have a lost intrauterine device (IUD) string located. Let your health care provider know about:  Any allergies you have, including allergies to prescribed medicine, latex, or iodine.  All medicines you are taking, including vitamins, herbs, eye drops, creams, and over-the-counter medicines. Bring a list of all of your medicines to your appointment.  Any problems you or family members have had with anesthetic medicines.  Any blood disorders you have.  Any surgeries you have had.  Any medical conditions you have, such as pelvic inflammatory disease (PID) or endometrial disorder.  Any history of frequent fainting.  Your menstrual cycle and what form of birth control (contraception) you use.  Your medical history, including any prior cervical treatment.  Whether you are pregnant or may be pregnant. What are the risks? Generally, this is a safe  procedure. However, problems may occur, including:  Pain.  Infection, which may include a fever, bad-smelling discharge, or pelvic pain.  Bleeding or discharge.  Misdiagnosis.  Fainting and vasovagal reactions, but this is rare.  Allergic reactions to medicines.  Damage to other structures or organs. What happens before the procedure?  If you have your menstrual period or will have it at the time of your procedure, tell your health care provider. A colposcopy typically is not done during menstruation.  Continue your contraceptive practices before and after the procedure.  For 24 hours before the colposcopy:  Do not douche.  Do not use tampons.  Do not use medicines, creams, or suppositories in the vagina.  Do not have sexual intercourse.  Ask your health care provider about:  Changing or stopping your regular medicines. This is especially important if you are taking diabetes medicines or blood thinners.  Taking medicines such as aspirin and ibuprofen. These medicines can thin your blood. Do not take these medicines before your procedure if your health care provider instructs you not to. It is likely that your health care provider will tell you to avoid taking aspirin or medicine that contains aspirin for 7 days before the procedure.  Follow instructions from your health care provider about eating or drinking restrictions. You will likely need to eat a regular diet the day of the procedure and not skip any meals.  You may have an exam or testing. A pregnancy test will be taken on the day of the procedure.  You may have a blood or urine sample taken.  Plan to have someone take you  home from the hospital or clinic.  If you will be going home right after the procedure, plan to have someone with you for 24 hours. What happens during the procedure?  You will lie down on your back, with your feet in foot rests (stirrups).  A warmed and lubricated instrument (speculum) will  be inserted into your vagina. The speculum will be used to hold apart the walls of your vagina so your health care provider can see your cervix and the inside of your vagina.  A cotton swab will be used to place a small amount of liquid solution on the areas to be examined. This solution makes it easier to see abnormal cells. You may feel a slight burning during this part.  The colposcope will be used to scan the cervix with a bright white light. The colposcope will be held near your vulvaand will magnify your vulva, vagina, and cervix for easier examination.  Your health care provider may decide to take a biopsy. If so:  You may be given medicine to numb the area (local anesthetic).  Surgical instruments will be used to suck out mucus and cells through your vagina.  You may feel mild pain while the tissue sample is removed.  Bleeding may occur. A solution may be used to stop the bleeding.  If a sample of tissue is needed from the inside of the cervix, a different procedure called endocervical curettage (ECC) may be completed. During this procedure, a curved instrument (curette) will be used to scrape cells from your cervix or the top of your cervix (endocervix).  Your health care provider will record the location of any abnormalities. The procedure may vary among health care providers and hospitals. What happens after the procedure?  You will lie down and rest for a few minutes. You may be offered juice or cookies.  Your blood pressure, heart rate, breathing rate, and blood oxygen level will be monitored until any medicines you were given have worn off.  You may have to wear compression stockings. These stockings help to prevent blood clots and reduce swelling in your legs.  You may have some cramping in your abdomen. This should go away after a few minutes. This information is not intended to replace advice given to you by your health care provider. Make sure you discuss any questions  you have with your health care provider. Document Released: 11/20/2002 Document Revised: 04/27/2016 Document Reviewed: 04/05/2016 Elsevier Interactive Patient Education  2017 [employer]

## 2016-09-15 NOTE — Progress Notes (Signed)
Pt having 2 hr gtt today. Pt needs to be informed of abnormal pap smear and need for colpo.

## 2016-09-15 NOTE — Progress Notes (Signed)
   PRENATAL VISIT NOTE  Subjective:  Deborah Santana is a 31 y.o. G5P4004 at 2875w3d being seen today for ongoing prenatal care.  She is currently monitored for the following issues for this low-risk pregnancy and has Supervision of low-risk pregnancy; Language barrier; and ASCUS with positive high risk HPV cervical on her problem list.  Patient reports no complaints.  Contractions: Not present. Vag. Bleeding: None.  Movement: Present. Denies leaking of fluid.   The following portions of the patient's history were reviewed and updated as appropriate: allergies, current medications, past family history, past medical history, past social history, past surgical history and problem list. Problem list updated.  Objective:   Vitals:   09/15/16 0922  BP: (!) 106/50  Pulse: 82  Weight: 171 lb (77.6 kg)    Fetal Status: Fetal Heart Rate (bpm): 146 Fundal Height: 27 cm Movement: Present     General:  Alert, oriented and cooperative. Patient is in no acute distress.  Skin: Skin is warm and dry. No rash noted.   Cardiovascular: Normal heart rate noted  Respiratory: Normal respiratory effort, no problems with respiration noted  Abdomen: Soft, gravid, appropriate for gestational age. Pain/Pressure: Absent     Pelvic:  Cervical exam performed        Extremities: Normal range of motion.  Edema: None  Mental Status: Normal mood and affect. Normal behavior. Normal judgment and thought content.   Assessment and Plan:  Pregnancy: G5P4004 at 3975w3d  1. Encounter for supervision of other normal pregnancy in second trimester  - 2Hr GTT w/ 1 Hr Carpenter 75 g  2. ASCUS with positive high risk HPV cervical - Informed of need for Colpo  3. Encounter for supervision of low-risk pregnancy in second trimester   Preterm labor symptoms and general obstetric precautions including but not limited to vaginal bleeding, contractions, leaking of fluid and fetal movement were reviewed in detail with the  patient. Please refer to After Visit Summary for other counseling recommendations.  Return in about 2 weeks (around 09/29/2016) for ROB and Colpo.   [name], CNM

## 2016-09-16 LAB — 2HR GTT W 1 HR, CARPENTER, 75 G
GLUCOSE, 1 HR, GEST: 107 mg/dL (ref <180)
GLUCOSE, FASTING, GEST: 67 mg/dL (ref 65–91)
Glucose, 2 Hr, Gest: 103 mg/dL (ref <153)

## 2016-09-28 ENCOUNTER — Encounter: Payer: Self-pay | Admitting: Obstetrics and Gynecology

## 2016-09-28 ENCOUNTER — Ambulatory Visit (INDEPENDENT_AMBULATORY_CARE_PROVIDER_SITE_OTHER): Payer: Medicaid Other | Admitting: Obstetrics and Gynecology

## 2016-09-28 DIAGNOSIS — Z23 Encounter for immunization: Secondary | ICD-10-CM | POA: Diagnosis not present

## 2016-09-28 DIAGNOSIS — R87610 Atypical squamous cells of undetermined significance on cytologic smear of cervix (ASC-US): Secondary | ICD-10-CM

## 2016-09-28 DIAGNOSIS — Z3492 Encounter for supervision of normal pregnancy, unspecified, second trimester: Secondary | ICD-10-CM

## 2016-09-28 DIAGNOSIS — R87810 Cervical high risk human papillomavirus (HPV) DNA test positive: Secondary | ICD-10-CM | POA: Diagnosis not present

## 2016-09-28 NOTE — Addendum Note (Signed)
Addended by: Cheree DittoGRAHAM, Kabe Mckoy A on: 09/28/2016 10:07 AM   Modules accepted: Orders

## 2016-09-28 NOTE — Progress Notes (Signed)
   PRENATAL VISIT NOTE  Subjective:  Deborah Santana is a 31 y.o. G5P4004 at 6865w2d being seen today for ongoing prenatal care.  She is currently monitored for the following issues for this low-risk pregnancy and has Supervision of low-risk pregnancy; Language barrier; and ASCUS with positive high risk HPV cervical on her problem list.  Patient reports Patient complains of some occassional left hip pain only happens when she stands for a long time. .  Contractions: Not present. Vag. Bleeding: None.  Movement: Present. Denies leaking of fluid.   The following portions of the patient's history were reviewed and updated as appropriate: allergies, current medications, past family history, past medical history, past social history, past surgical history and problem list. Problem list updated.  Objective:   Vitals:   09/28/16 0849  BP: 109/64  Pulse: 91  Weight: 175 lb (79.4 kg)    Fetal Status: Fetal Heart Rate (bpm): 143   Movement: Present     General:  Alert, oriented and cooperative. Patient is in no acute distress.  Skin: Skin is warm and dry. No rash noted.   Cardiovascular: Normal heart rate noted  Respiratory: Normal respiratory effort, no problems with respiration noted  Abdomen: Soft, gravid, appropriate for gestational age. Pain/Pressure: Present     Pelvic:  Cervical exam deferred        Extremities: Normal range of motion.  Edema: Trace  Mental Status: Normal mood and affect. Normal behavior. Normal judgment and thought content.    GYNECOLOGY CLINIC COLPOSCOPY VISIT AND PROCEDURE NOTE  31 y.o. [id number] here for colposcopy for ASCUS with POSITIVE high risk HPV pap smear on 08/25/2016. Prior cervical cytology and/or colposcopy findings: none. The patient reports the following prior treatments to the vulva/vagina/cervix: none. The patient not a cigarette smoker. The patient not immunosuppressed. The patient is pregnant. The patient not taking anticoagulants and not allergic to  iodine.  Patient given informed consent, signed copy in the chart, time out was performed. Urine pregnancy test performed and confirmed to be negative.  Placed in lithotomy position.  Gross findings:   Vagina: normal  Vulva: normal   Cervix: normal  Visualization after:  Acetic acid:  No abnormal uptake, unable to see entire SCJ  Lugol's solution: no abnormal uptake unable to see entire SCJ  Green or blue filter: no abnormal vessels  Physical Exam  Genitourinary:     Biopsies obtained: none  ECC specimen obtained: defered due to pregnancy status  Colposcopy adequate? No    Patient was given post procedure instructions.  Will follow up pathology and manage accordingly.      Shonna ChockNoah Wouk, MD OB/GYN Fellow   Assessment and Plan:  Pregnancy: [phone] at 5665w2d  1. Encounter for supervision of low-risk pregnancy in second trimester -Tdap today, otherwise uptoday -no complaints  2. ASCUS with positive high risk HPV cervical -Colposcopy today inadequate.  -unable to see the entire SCJ. -plan to repeat colpo post partum  Preterm labor symptoms and general obstetric precautions including but not limited to vaginal bleeding, contractions, leaking of fluid and fetal movement were reviewed in detail with the patient. Please refer to After Visit Summary for other counseling recommendations.  No Follow-up on file.   Lorne SkeensNicholas Michael Anaia Frith, MD

## 2016-09-28 NOTE — Patient Instructions (Signed)
Colposcopy, Care After This sheet gives you information about how to care for yourself after your procedure. Your doctor may also give you more specific instructions. If you have problems or questions, contact your doctor. What can I expect after the procedure? If you did not have a tissue sample removed (did not have a biopsy), you may only have some spotting for a few days. You can go back to your normal activities. If you had a tissue sample removed, it is common to have:  Soreness and pain. This may last for a few days.  Light-headedness.  Mild bleeding from your vagina or dark-colored, grainy discharge from your vagina. This may last for a few days. You may need to wear a sanitary pad.  Spotting for at least 48 hours after the procedure. Follow these instructions at home:  Take over-the-counter and prescription medicines only as told by your doctor. Ask your doctor what medicines you can start taking again. This is very important if you take blood-thinning medicine.  Do not drive or use heavy machinery while taking prescription pain medicine.  For 3 days, or as long as your doctor tells you, avoid:  Douching.  Using tampons.  Having sex.  If you use birth control (contraception), keep using it.  Limit activity for the first day after the procedure. Ask your doctor what activities are safe for you.  It is up to you to get the results of your procedure. Ask your doctor when your results will be ready.  Keep all follow-up visits as told by your doctor. This is important. Contact a doctor if:  You get a skin rash. Get help right away if:  You are bleeding a lot from your vagina. It is a lot of bleeding if you are using more than one pad an hour for 2 hours in a row.  You have clumps of blood (blood clots) coming from your vagina.  You have a fever.  You have chills  You have pain in your lower belly (pelvic area).  You have signs of infection, such as vaginal  discharge that is:  Different than usual.  Yellow.  Bad-smelling.  You have very pain or cramps in your lower belly that do not get better with medicine.  You feel light-headed.  You feel dizzy.  You pass out (faint). Summary  If you did not have a tissue sample removed (did not have a biopsy), you may only have some spotting for a few days. You can go back to your normal activities.  If you had a tissue sample removed, it is common to have mild pain and spotting for 48 hours.  For 3 days, or as long as your doctor tells you, avoid douching, using tampons and having sex.  Get help right away if you have bleeding, very bad pain, or signs of infection. This information is not intended to replace advice given to you by your health care provider. Make sure you discuss any questions you have with your health care provider. Document Released: 02/16/2008 Document Revised: 05/19/2016 Document Reviewed: 05/19/2016 Elsevier Interactive Patient Education  2017 [employer]  

## 2016-10-21 ENCOUNTER — Ambulatory Visit (INDEPENDENT_AMBULATORY_CARE_PROVIDER_SITE_OTHER): Payer: Medicaid Other | Admitting: Obstetrics and Gynecology

## 2016-10-21 VITALS — BP 110/48 | HR 75 | Wt 179.5 lb

## 2016-10-21 DIAGNOSIS — Z3493 Encounter for supervision of normal pregnancy, unspecified, third trimester: Secondary | ICD-10-CM

## 2016-10-21 DIAGNOSIS — Z789 Other specified health status: Secondary | ICD-10-CM

## 2016-10-21 LAB — CBC
HEMATOCRIT: 30.2 % — AB (ref 35.0–45.0)
Hemoglobin: 10.2 g/dL — ABNORMAL LOW (ref 11.7–15.5)
MCH: 27.8 pg (ref 27.0–33.0)
MCHC: 33.8 g/dL (ref 32.0–36.0)
MCV: 82.3 fL (ref 80.0–100.0)
MPV: 11.4 fL (ref 7.5–12.5)
Platelets: 148 10*3/uL (ref 140–400)
RBC: 3.67 MIL/uL — ABNORMAL LOW (ref 3.80–5.10)
RDW: 13.9 % (ref 11.0–15.0)
WBC: 8 10*3/uL (ref 3.8–10.8)

## 2016-10-21 NOTE — Patient Instructions (Signed)
Back Pain in Pregnancy Introduction Back pain during pregnancy is common. Back pain may be caused by several factors that are related to changes during your pregnancy. Follow these instructions at home: Managing pain, stiffness, and swelling  If directed, apply ice for sudden (acute) back pain.  Put ice in a plastic bag.  Place a towel between your skin and the bag.  Leave the ice on for 20 minutes, 2-3 times per day.  If directed, apply heat to the affected area before you exercise:  Place a towel between your skin and the heat pack or heating pad.  Leave the heat on for 20-30 minutes.  Remove the heat if your skin turns bright red. This is especially important if you are unable to feel pain, heat, or cold. You may have a greater risk of getting burned. Activity  Exercise as told by your health care provider. Exercising is the best way to prevent or manage back pain.  Listen to your body when lifting. If lifting hurts, ask for help or bend your knees. This uses your leg muscles instead of your back muscles.  Squat down when picking up something from the floor. Do not bend over.  Only use bed rest as told by your health care provider. Bed rest should only be used for the most severe episodes of back pain. Standing, Sitting, and Lying Down  Do not stand in one place for long periods of time.  Use good posture when sitting. Make sure your head rests over your shoulders and is not hanging forward. Use a pillow on your lower back if necessary.  Try sleeping on your side, preferably the left side, with a pillow or two between your legs. If you are sore after a night's rest, your bed may be too soft. A firm mattress may provide more support for your back during pregnancy. General instructions  Do not wear high heels.  Eat a healthy diet. Try to gain weight within your health care provider's recommendations.  Use a maternity girdle, elastic sling, or back brace as told by your  health care provider.  Take over-the-counter and prescription medicines only as told by your health care provider.  Keep all follow-up visits as told by your health care provider. This is important. This includes any visits with any specialists, such as a physical therapist. Contact a health care provider if:  Your back pain interferes with your daily activities.  You have increasing pain in other parts of your body. Get help right away if:  You develop numbness, tingling, weakness, or problems with the use of your arms or legs.  You develop severe back pain that is not controlled with medicine.  You have a sudden change in bowel or bladder control.  You develop shortness of breath, dizziness, or you faint.  You develop nausea, vomiting, or sweating.  You have back pain that is a rhythmic, cramping pain similar to labor pains. Labor pain is usually 1-2 minutes apart, lasts for about 1 minute, and involves a bearing down feeling or pressure in your pelvis.  You have back pain and your water breaks or you have vaginal bleeding.  You have back pain or numbness that travels down your leg.  Your back pain developed after you fell.  You develop pain on one side of your back.  You see blood in your urine.  You develop skin blisters in the area of your back pain. This information is not intended to replace advice given to   you by your health care provider. Make sure you discuss any questions you have with your health care provider. Document Released: 12/08/2005 Document Revised: 02/05/2016 Document Reviewed: 05/14/2015  2017 Elsevier  

## 2016-10-21 NOTE — Progress Notes (Signed)
   PRENATAL VISIT NOTE  Subjective:  Deborah Santana is a 31 y.o. G5P4004 at 273w4d being seen today for ongoing prenatal care.  She is currently monitored for the following issues for this low-risk pregnancy and has Supervision of low-risk pregnancy; Language barrier; and ASCUS with positive high risk HPV cervical on her problem list.  Patient reports backache. Patient does report back and mild abdominal pain after standing all day at her job. She is only occasionally using the pregnancy support belt. She has not tried taking tylenol on the days that her pain is worse. She has no other complaints.  Contractions: Not present.  .  Movement: Present. Denies leaking of fluid.   The following portions of the patient's history were reviewed and updated as appropriate: allergies, current medications, past family history, past medical history, past social history, past surgical history and problem list. Problem list updated.  Objective:   Vitals:   10/21/16 1337  BP: (!) 110/48  Pulse: 75  Weight: 179 lb 8 oz (81.4 kg)    Fetal Status: Fetal Heart Rate (bpm): 145   Movement: Present     General:  Alert, oriented and cooperative. Patient is in no acute distress.  Skin: Skin is warm and dry. No rash noted.   Cardiovascular: Normal heart rate noted  Respiratory: Normal respiratory effort, no problems with respiration noted  Abdomen: Soft, gravid, appropriate for gestational age. Pain/Pressure: Present     Pelvic:  Cervical exam deferred        Extremities: Normal range of motion.     Mental Status: Normal mood and affect. Normal behavior. Normal judgment and thought content.   Assessment and Plan:  Pregnancy: G5P4004 at 113w4d  1. Encounter for supervision of low-risk pregnancy in third trimester Advised PRN tylenol for back pain - CBC - RPR  2. Language barrier In person translator used.  Preterm labor symptoms and general obstetric precautions including but not limited to vaginal bleeding,  contractions, leaking of fluid and fetal movement were reviewed in detail with the patient. Please refer to After Visit Summary for other counseling recommendations.  No Follow-up on file.   Lorne SkeensNicholas Michael Hykeem Ojeda, MD

## 2016-10-22 LAB — RPR

## 2016-11-04 ENCOUNTER — Ambulatory Visit (INDEPENDENT_AMBULATORY_CARE_PROVIDER_SITE_OTHER): Payer: Medicaid Other | Admitting: Advanced Practice Midwife

## 2016-11-04 VITALS — BP 112/59 | HR 84 | Wt 178.7 lb

## 2016-11-04 DIAGNOSIS — Z789 Other specified health status: Secondary | ICD-10-CM

## 2016-11-04 DIAGNOSIS — O1203 Gestational edema, third trimester: Secondary | ICD-10-CM

## 2016-11-04 DIAGNOSIS — Z3493 Encounter for supervision of normal pregnancy, unspecified, third trimester: Secondary | ICD-10-CM

## 2016-11-04 MED ORDER — T.E.D. BELOW KNEE/L-REGULAR MISC: 1.0000 | Freq: Every day | 0 refills | Status: DC

## 2016-11-04 NOTE — Patient Instructions (Addendum)
For TED hose/socks:   [facility] [address] [place], [place] [address]  Phone: [phone]  [facility] [address] [place], [place], [place] [address]  Phone: [phone]   Edema Edema ni buildp isiyo ya [place] ya maji [place] tishu za [place]. Edema ni tegemezi fulani juu ya mvuto wa kuvuta maji kwenye sehemu ya chini kabisa katika mwili wako. Hiyo inafanya hali ya [place] zaidi [place] miguu na mapaja (chini ya mwisho). Uvimbe usio na ubongo wa miguu na vidole ni kawaida na inakuwa uwezekano mkubwa zaidi [place]. Erie Noe ni Murray Hodgkins tishu za [place], kama [place] macho [place]. Wakati eneo lililoathirika limefanywa, maji yanaweza kutoka nje ya eneo hilo na kuondoka kwa dakika kwa muda mfupi. Dent hii inaitwa pitting. Sababu ni nini? Kuna sababu nyingi za uwezekano wa edema. Kula chumvi nyingi na kuwa juu ya miguu yako au [place] kwa muda mrefu kunaweza kusababisha edema katika miguu yako na vidole. Hali ya hewa ya hewa inaweza kusababisha edema mbaya zaidi. Sababu za kawaida za matibabu za edema ni pamoja na: Moyo kushindwa [employer]ild[employer]. Ugonjwa wa ini. Ugonjwa wa figo. Mishipa ya damu dhaifu [employer]. Saratani. Truman Hayward. Mimba. Baadhi ya dawa. Uzito. Je! Ni ishara au dalili? Edema mara nyingi haipungukani. Ngozi yako inaweza kuangalia [employer]. Je, hii imegunduliwaje? Mtoa huduma wako wa afya anaweza kutambua edema kwa Libyan Arab Jamahiriya kuhusu historia yako ya matibabu na kufanya mtihani wa kimwili. Unahitaji kuwa na vipimo kama vile X-rays, electrocardiogram, au vipimo vya damu ili kuangalia hali ya matibabu ambayo inaweza kusababisha edema. Je, hii inatibiwaje? Tiba ya Edema inategemea sababu. Barnett Hatter moyo, ini, au [place] wa [place], unahitaji matibabu inayofaa kwa hali hizi. Matibabu ya jumla yanaweza kujumuisha: [place]o wa sehemu ya mwili iliyoathirika juu ya kiwango cha moyo wako. Ukandamizaji wa sehemu ya mwili  iliyoathirika. Shinikizo kutoka bandages ya elastic au soksi ya msaada hupunguza tishu na nguvu za maji nyuma kwenye mishipa ya damu. Hii inachukua maji ya [place] kwenye tishu. Uzuiaji wa ulaji wa maji na chumvi. Matumizi ya kidonge cha maji (diuretic). Madawa haya yanafaa tu kwa aina fulani za edema. [place] maji nje ya mwili wako na kukufanya urinate mara nyingi zaidi. Hii inachukua maji na hupunguza uvimbe, lakini diuretics inaweza kuwa na madhara. Tumia tu diuretiki kama ilivyoagizwa na mtoa huduma wako wa afya. Fuata maelekezo haya nyumbani: Weka sehemu ya mwili iliyoathirika juu ya kiwango cha moyo wako unapokuwa umelala. Usiketi au kusimama kwa muda mrefu. Usiweke chochote moja kwa moja chini ya magoti yako [place]. Doree Albee nguo au vitambaa kwenye miguu yako ya juu. Zoezi miguu yako kufanya kazi ya maji nyuma kwenye mishipa yako ya damu. Hii inaweza kusaidia uvimbe kwenda chini. Kuvaa bandages ya ustaafu au soksi za usaidizi ili [place] wa mguu kama ilivyoagizwa na mtoa huduma wako wa afya. Kula chakula cha chini cha chumvi ili kupunguza maji kama mtoa huduma wako wa afya anapendekeza. Tu Henrene Pastor dawa kama ilivyoagizwa na mtoa huduma wako wa afya. Wasiliana na mtoa huduma ya afya ikiwa: Edema yako haijibu kwa matibabu. Rockne Menghini, ini, au [place] wa figo na dalili za taarifa za edema. Una edema [place] miguu yako ambayo haina kuboresha baada Wallace Going. Una faida ya uzito ghafla na isiyojulikana. Pata msaada mara moja ikiwa: Unaendeleza kupunguzwa kwa pumzi au maumivu ya kifua. Huwezi kupumua wakati ulala. Unaendeleza maumivu, upekundu, au joto [place]. Rockne Menghini, ini, au [place] wa figo na ghafla kupata edema. Una homa na dalili zako huwa mbaya zaidi. Habari hii haikusudiwa [place]union nafasi  ya ushauri uliotolewa na mtoa huduma wako wa afya. Hakikisha kujadili maswali yoyote unayo na mtoa huduma wako wa afya. Hati iliyotolewa: 08/30/2005 Hati  iliyorekebishwa: 02/05/2016 Hati Iliyotathmini: 06/22/2013 Elsevier Interactive Patient Education  2017 [employer]     Trimester ya tatu Tye Savoyya ujauzito The trimester ya tatu ni kutoka wiki 29 hadi wiki 40 (miezi 7 hadi 9). Trimester ya tatu ni wakati ambapo mtoto asiyezaliwa (fetus) anaongezeka kwa kasi. Mwishoni mwa mwezi wa tisa, fetusi inakaribia urefu wa inchi 20 na inakuja safu ya 6-10. Mwili mabadiliko wakati wa trimester yako ya tatu Mwili wako unapita kupitia mabadiliko mengi wakati wa ujauzito. Mabadiliko hutofautiana kutoka mwanamke hadi mwanamke. Dina RichWakati wa tatu ya trimester: Elvia CollumUzito wako utaendelea kuongezeka. Unaweza kutarajia kupata pounds 25-35 (11-16 kg) mwishoni mwa ujauzito. Tomasa RandUnaweza kuanza kupata alama za Fairfieldkunyoosha kwenye vidonda vyako, tumbo, na matiti. Trinidad CuretUnaweza Milus Heightkukimbia kwa mara nyingi kwa sababu fetusi inapita chini ndani ya pelvis yako na kuimarisha kinga yako. Lowella DandyUnaweza kuendeleza au kuendelea kuwa na upungufu wa moyo. Hii inasababishwa na homoni zilizoongezeka zinazopungua misuli katika njia ya utumbo. Lowella DandyUnaweza kuendeleza au Hillery Aldokuendelea kuwa na kuvimbiwa kwa sababu homoni zilizoongezeka hupunguza digestion na kusababisha misuli inayochochea taka Guineakupitia matumbo Greenlandyako kupumzika. Lowella DandyUnaweza kuendeleza hemorrhoids. Hizi ni mishipa ya kuvimba (mishipa ya varicose) Liston Albakatika rectum ambayo inaweza kupiga au kuwa chungu. Leonette MonarchUnaweza kuendeleza kuvimba, mishipa yenye mishipa (mishipa ya varicose) kwenye miguu yako. Huenda umeongezeka kwa mazao ya mwili kwenye pelvis, nyuma, au mapaja. Hii ni kutokana na kupata uzito na homoni zilizoongezeka ambazo zinafurahia viungo vyako. Unaweza kuwa na mabadiliko katika nywele zako. Hizi zinaweza kuunganisha nywele zako, Apexukuaji wa Lobelvilleharaka, na mabadiliko katika texture. [place] wengine pia hupoteza nywele wakati au baada ya ujauzito, au nywele ambazo huhisi kuwa kavu au nyembamba. Nywele zako Macaohuenda kurudi Mozambiquekawaida baada ya mtoto Wildersvillewako kuzaliwa. Matiti yako  itaendelea Central African Republickukua na wataendelea kuwa zabuni. Maji ya njano (rangi) yanaweza kuvuja kutoka kwenye matiti Catharineyako. Huu ndio maziwa ya kwanza unayozalisha kwa mtoto wako. Kifungo chako cha tumbo kinaweza kufungwa nje. Unaweza kuona uvimbe zaidi Comcastkatika mikono yako, uso, au vidole. Huenda umeongeza kuunganisha au Medco Health Solutionskupoteza mikono, mikono, na miguu. Ngozi ya tumbo yako inaweza pia Palestinian Territorykujisikia. Trinidad CuretUnaweza kujisikia kupumua kwa sababu ya Deon Pillinguterasi yako ya Sierra Leonekupanua. Garen GramsUnaweza kuwa na matatizo zaidi Landscape architectya kulala. Hii inaweza kusababishwa na Ethiopiaukubwa wa tumbo lako, haja ya Brownsvillekuongeza urinate, na ongezeko la kimetaboliki ya mwili wako. Simeon CraftUnaweza kuona fetusi "Somaliakuacha," au kusonga chini ndani ya tumbo lako. Unaweza kuongezeka kwa kutokwa kwa uke. Mimba yako ya uzazi inakuwa nyembamba na iliyosababishwa (imefutwa) karibu na tarehe Duffy Bruceyako ya kutolewa. Nini cha kutarajia wakati wa kutembelea kabla ya kujifungua Utakuwa na mitihani ya ujauzito kila baada ya wiki 2 mpaka juma 36. Kisha utakuwa na mitihani ya kila wiki kabla ya kujifungua. Livingston HealthcareWakati wa ziara ya Vanetta Mulderskawaida kabla ya Leandrew KoyanagikuzaliwaOleta Mouse: Utakuwa kipimo ili Koreakuhakikisha wewe na fetus ni Leandrew Koyanagikukua kawaida. Shinikizo la damu itachukuliwa. Mimba yako itahesabiwa kufuatilia ukuaji wa mtoto wako. Pigo la moyo la fetasi litasikilizwa. Matokeo yoyote ya mtihani kutoka kwa ziara ya awali itajadiliwa. Garen GramsUnaweza kuwa na hundi ya kizazi karibu na tarehe Su Monksyako ya Millertonkutosha ili uone kama umefuta. Karibu na wiki 36, mtoa huduma wako wa afya ataangalia chanjo yako. Wakati huo huo, mtoa huduma wako wa afya pia atafanya mtihani juu ya ufumbuzi wa tishu za uke. Jaribio hili ni Latviakuamua kama aina ya bakteria, streptococcus ya Mount PleasantKundi B, iko. Mtoa huduma wako wa afya ataelezea jambo hili zaidi. Mtoa huduma wako wa afya anaweza Boliviakukuuliza: Mpango wako wa uzazi ni nini. Jinsi unavyohisi. Lavonna MonarchIkiwa unahisi hisia za  mtoto. Ollen Barges na dalili zisizo za [place], kama vile maji [place], [place] damu, maumivu ya [place], au  [place] kwa tumbo. Ikiwa unatumia bidhaa yoyote ya tumbaku, ikiwa ni pamoja na sigara, tumbaku ya [place], na sigara za elektroniki. Ikiwa una maswali yoyote. Vipimo vingine au uchunguzi ambao Romilda Garret wa trimester yako ya tatu ni pamoja na: Vipimo vya damu vinavyoangalia kiwango cha chini cha chuma (anemia). Upimaji wa fetasi kuangalia afya, kiwango cha shughuli, na ukuaji wa fetusi. Upimaji unafanyika ikiwa una hali fulani ya matibabu au ikiwa [place] matatizo wakati wa ujauzito. Mtihani usio na mafanikio (NST). Jaribio hili hunashughulikia afya ya mtoto wako ili kuhakikisha kuwa hakuna dalili [place], [place] vile mtoto asiyepata oksijeni ya [place]. Dina Rich wa mtihani huu, ukanda unawekwa karibu na tumbo lako. Mtoto hutolewa, na kiwango cha moyo wake hufuatiliwa wakati wa [place]. Erie Noe! Kazi ya uongo ni nini? Kazi ya Caprice Red ni Reina Fuse unasikia vidogo vidogo visivyo kawaida vya misuli ndani ya tumbo (contractions) ambayo hatimaye huenda. Hizi huitwa contraction za Faith. Mipangilio inaweza kudumu kwa masaa, siku, au hata wiki kabla ya kazi ya kweli iingie. [place] vikwazo vinakuja mara kwa [place], kuwa mara kwa mara zaidi, [place] Arab [place] kwa nguvu, au [place], [place] mtoa [place] wa afya.  Ni ishara za kazi? Mimba ya tumbo. Vikwazo vya Annabell Howells vinavyoanza kwa dakika 10 mbali na kuwa na nguvu na mara kwa mara na wakati. Vipindi vinavyoanza juu ya uzazi na kuenea chini ya tumbo na nyuma. Kuongezeka kwa shinikizo la pelvic na maumivu ya nyuma. Umwagaji mwisi wa maji au umwagaji damu ambao unatokana na uke. Kuvuja kwa maji ya amniotic. Hii pia inajulikana kama "kuvunja maji" kwako. Inaweza kuwa polepole au kupungua. Hebu daktari wako kujua kama ina rangi au harufu ya ajabu. Lavonna Monarch una dalili hizi, piga simu mtoa huduma wako wa afya mara moja, hata kama ni kabla ya tarehe [employer]. Fuata maelekezo haya nyumbani: Kula na [place]land Endelea kula chakula Geralynn Ochs, cha afya. UsilaAltamese [place] za nyama au nyama huenea. Maziwa yasiyotumiwa au jibini. Maji yasiyotumiwa. Saladi iliyohifadhiwa. Dagaa ya sigara iliyovuta friji. Mbwa za moto au nyama ya ladha, isipokuwa wanapiga moto. Zaidi ya 6 ounces ya tanuba ya [employer] Shark, swordfish, mackerel mfalme, au samaki ya tile. Saladi zilizohifadhiwa. Mizabibu mazao, kama vile maharagwe ya mung au mimea ya alfalfa. Chukua vitamini vya Dominica kama ilivyoambiwa na mtoa huduma wako wa afya. Kuchukua 1000 mg ya kila siku ya kalsiamu kama ilivyoambiwa na mtoa huduma wako wa afya. Lavonna Monarch Wylene Men Bernarda CaffeyOris Drone madawa ya juu au ya dawa. Kunywa maji ya Andorra kuweka mkojo wako wazi au rangi njano. Kula vyakula vilivyo juu sana, kama vile matunda na [address], nafaka nzima, na [place]. Weka vyakula vyenye mafuta na sukari iliyosafishwa, kama vile vyakula vya kukaanga na vitamu. Shughuli Zoezi tu kama ilivyoagizwa na mtoa huduma wako wa afya. [place] wajawazito wanapaswa kusudi la masaa 2 na dakika 30 za zoezi la Retail buyer. Lavonna Monarch unakabiliwa na maumivu yoyote au usumbufu wakati wa kutumia, simama. Epuka kuinua nzito. Usitumike katika joto [employer], au [place] urefu wa juu. Kuvaa kisigino, viatu vizuri. Tumia mkao mzuri. [place] wa mbali isipokuwa ni lazima kabisa na kwa idhini ya mtoa huduma wako wa afya. Vaa ukanda wako wa kiti wakati wote wakati wa gari, kwenye basi, au [place] ndege. Kuchukua mapumziko ya [place] kwa mara na kupumzika na miguu yako imeinua ikiwa una miguu ya miguu au maumivu ya chini ya nyuma. Usitumie bafuni za moto, vyumba vya mvuke, au  saunas. Alcus Dad ngono isipokuwa mtoa huduma wako wa afya atakuambia vinginevyo. Maisha Usitumie bidhaa yoyote zinazo na nikotini au tumbaku, kama vile sigara na e-sigara. Ikiwa unahitaji msaada wa kushoto, uulize mtoa huduma wako wa afya. Usinywe pombe. Usitumie mimea yoyote ya dawa au dawa  zisizochaguliwa. Kemikali hizi zinaathiri malezi na ukuaji wa mtoto. Lavonna Monarch unaendeleza mishipa ya vurugu: Kuvaa pantyhose au vifuniko vya ukandamizaji kama ilivyoambiwa na mtoa huduma wako wa afya. Kuinua miguu yako kwa dakika 15, mara 3-4 kwa siku. Kuvaa bra ya uzazi kwa msaada na upole wa matiti. Maelekezo ya jumla [place] madawa ya juu na ya dawa ya dawa kama ilivyoambiwa na mtoa huduma wako wa afya. Kuna madawa ambayo yana salama au salama kuchukua wakati wa ujauzito. Kuchukua bahari ya joto ya bahari ili kupunguza maumivu yoyote au usumbufu unaosababishwa na tumbo. Tumia cream ya hemorrhoid au hazel mchawi ikiwa mtoa huduma wako wa afya anaidhinisha. [place] masanduku ya [place] na udongo wa paka. Hizi hubeba virusi ambazo zinaweza kusababisha kasoro za kuzaliwa kwa mtoto. Barnett Hatter paka, muulize mtu kusafisha sanduku la [place]. Kuandaa kwa ajili ya [place] kwa mtoto wako: Henrene Pastor madarasa ya kujifungua kabla ya [place], kufanya [place], na Libyan Arab Jamahiriya maswali kuhusu kazi na utoaji. Tengeneza jaribio kwenye hospitali. Tembelea hospitali na tembelea eneo la uzazi. Panga kwa ajili ya uzazi au [place] kupitia waajiri. Panga kwa familia na marafiki [place] wanyama wa kipenzi wakati unapokuwa hospitalini. Ununuzi kiti cha gari cha [facility] nyuma na hakikisha unajua jinsi ya kuiweka kwenye gari lako. Pakia mfuko wako wa hospitali. Kuandaa kitalu cha mtoto. Lelon Huh Ang[place] mito yote na wanyama ulioingizwa kutoka kwenye [place] mtoto ili [place]. Tembelea daktari wako wa meno kama hujaenda wakati wa [place]. Tumia dawa ya meno ya laini ili [place] meno yako na kuwa mpole wakati unapozunguka. Weka ziara zote za kufuatilia kabla ya kujifungua kama inavyoambiwa na mtoa huduma wako wa afya. Hii ni muhimu. Wasiliana na mtoa huduma ya afya ikiwa: Dolan Amen kama wewe ni Carleene Mains au ikiwa maji yako yamevunjika. Unajisikia. Una misuli ya pelvic kali, shinikizo la  pelvic, au maumivu ya ngumu katika eneo lako la tumbo. Una maumivu ya nyuma ya nyuma. Una kichefuchefu ya [place], Phyllis Ginger, au [place]. Jacques Navy ya kutosha ya uke ya [place]. Una maumivu wakati unapokwisha. Pata msaada mara moja ikiwa: Una homa. Unachovuja maji kutoka kwa uke wako. Alben Spittle au kumwagika kutoka kwa uke wako. Una maumivu makali ya tumbo au kuponda. Una kupoteza uzito haraka au kupata uzito. Una pumzi fupi na maumivu ya kifua. Unaona uvimbe wa ghafla au uliokithiri wa uso wako, mikono, vidole, miguu, au miguu. Mtoto wako hufanya harakati chini ya 10 katika masaa 2. Una maumivu ya kichwa ambayo hayaendi na dawa. Wewe

## 2016-11-04 NOTE — Progress Notes (Signed)
   PRENATAL VISIT NOTE  Subjective:  Deborah Santana is a 31 y.o. G5P4004 at 2360w4d being seen today for ongoing prenatal care.  She is currently monitored for the following issues for this low-risk pregnancy and has Supervision of low-risk pregnancy; Language barrier; and ASCUS with positive high risk HPV cervical on her problem list.  Patient reports backache.  Contractions: Not present. Vag. Bleeding: None.  Movement: Present. Denies leaking of fluid.   The following portions of the patient's history were reviewed and updated as appropriate: allergies, current medications, past family history, past medical history, past social history, past surgical history and problem list. Problem list updated.  Objective:   Vitals:   11/04/16 1248  BP: (!) 112/59  Pulse: 84  Weight: 178 lb 11.2 oz (81.1 kg)    Fetal Status: Fetal Heart Rate (bpm): 140   Movement: Present     General:  Alert, oriented and cooperative. Patient is in no acute distress.  Skin: Skin is warm and dry. No rash noted.   Cardiovascular: Normal heart rate noted  Respiratory: Normal respiratory effort, no problems with respiration noted  Abdomen: Soft, gravid, appropriate for gestational age. Pain/Pressure: Present     Pelvic:  Cervical exam performed        Extremities: Normal range of motion.  Edema: Trace  Mental Status: Normal mood and affect. Normal behavior. Normal judgment and thought content.   Assessment and Plan:  Pregnancy: G5P4004 at 5760w4d  1. Encounter for supervision of low-risk pregnancy in third trimester --Pt desires to be taken out of work r/t abdominal and back pain and swelling of feet ankles. Discussed that there is no medical indication for FMLA but pt may take unpaid leave if employer allows.   --No signs of PTL today with closed cervix, no elevated BP associated with edema. --Will continue to treat symptoms to make work more tolerable. --Work restrictions letter provided  2. Language  barrier --Swahili interpreter present for all communication  3. Edema during pregnancy in third trimester --Intermittent nonpitting edema of BLE.   --Elevate, increase PO fluids/decrease sodium, Rx for TED hose.  Preterm labor symptoms and general obstetric precautions including but not limited to vaginal bleeding, contractions, leaking of fluid and fetal movement were reviewed in detail with the patient. Please refer to After Visit Summary for other counseling recommendations.  Return in about 2 weeks (around 11/18/2016).   Hurshel PartyLisa A Leftwich-Kirby, CNM

## 2016-11-18 ENCOUNTER — Ambulatory Visit (INDEPENDENT_AMBULATORY_CARE_PROVIDER_SITE_OTHER): Payer: Medicaid Other | Admitting: Obstetrics and Gynecology

## 2016-11-18 VITALS — BP 115/59 | HR 88 | Wt 178.0 lb

## 2016-11-18 DIAGNOSIS — Z789 Other specified health status: Secondary | ICD-10-CM

## 2016-11-18 DIAGNOSIS — Z3493 Encounter for supervision of normal pregnancy, unspecified, third trimester: Secondary | ICD-10-CM

## 2016-11-18 DIAGNOSIS — Z3483 Encounter for supervision of other normal pregnancy, third trimester: Secondary | ICD-10-CM

## 2016-11-18 DIAGNOSIS — Z603 Acculturation difficulty: Secondary | ICD-10-CM

## 2016-11-18 NOTE — Progress Notes (Signed)
   PRENATAL VISIT NOTE  Subjective:  Deborah Santana is a 4Herschell Dimes731 y.o. G5P4004 at 3346w4d being seen today for ongoing prenatal care.  She is currently monitored for the following issues for this low-risk pregnancy and has Supervision of low-risk pregnancy; Language barrier; and ASCUS with positive high risk HPV cervical on her problem list.  Patient reports no complaints.  Contractions: Not present. Vag. Bleeding: None.  Movement: Present. Denies leaking of fluid.   The following portions of the patient's history were reviewed and updated as appropriate: allergies, current medications, past family history, past medical history, past social history, past surgical history and problem list. Problem list updated.  Objective:   Vitals:   11/18/16 1016  BP: (!) 115/59  Pulse: 88  Weight: 178 lb (80.7 kg)    Fetal Status: Fetal Heart Rate (bpm): 150   Movement: Present     General:  Alert, oriented and cooperative. Patient is in no acute distress.  Skin: Skin is warm and dry. No rash noted.   Cardiovascular: Normal heart rate noted  Respiratory: Normal respiratory effort, no problems with respiration noted  Abdomen: Soft, gravid, appropriate for gestational age. Pain/Pressure: Present     Pelvic:  Cervical exam deferred        Extremities: Normal range of motion.  Edema: Trace  Mental Status: Normal mood and affect. Normal behavior. Normal judgment and thought content.   Assessment and Plan:  Pregnancy: G5P4004 at 9846w4d  1. Encounter for supervision of low-risk pregnancy in third trimester -doing well -36 wk cultures at next visit -not given for work just today as she could not go because of her appointment.   2. Language barrier [place] used.  Preterm labor symptoms and general obstetric precautions including but not limited to vaginal bleeding, contractions, leaking of fluid and fetal movement were reviewed in detail with the patient. Please refer to After Visit Summary for other  counseling recommendations.  Return in about 1 week (around 11/25/2016) for LOB.   Lorne SkeensNicholas Michael Lexee Brashears, MD

## 2016-11-18 NOTE — Patient Instructions (Signed)
Third Trimester of Pregnancy The third trimester is from week 28 through week 40 (months 7 through 9). The third trimester is a time when the unborn baby (fetus) is growing rapidly. At the end of the ninth month, the fetus is about 20 inches in length and weighs 6-10 pounds. Body changes during your third trimester Your body will continue to go through many changes during pregnancy. The changes vary from woman to woman. During the third trimester:  Your weight will continue to increase. You can expect to gain 25-35 pounds (11-16 kg) by the end of the pregnancy.  You may begin to get stretch marks on your hips, abdomen, and breasts.  You may urinate more often because the fetus is moving lower into your pelvis and pressing on your bladder.  You may develop or continue to have heartburn. This is caused by increased hormones that slow down muscles in the digestive tract.  You may develop or continue to have constipation because increased hormones slow digestion and cause the muscles that push waste through your intestines to relax.  You may develop hemorrhoids. These are swollen veins (varicose veins) in the rectum that can itch or be painful.  You may develop swollen, bulging veins (varicose veins) in your legs.  You may have increased body aches in the pelvis, back, or thighs. This is due to weight gain and increased hormones that are relaxing your joints.  You may have changes in your hair. These can include thickening of your hair, rapid growth, and changes in texture. Some women also have hair loss during or after pregnancy, or hair that feels dry or thin. Your hair will most likely return to normal after your baby is born.  Your breasts will continue to grow and they will continue to become tender. A yellow fluid (colostrum) may leak from your breasts. This is the first milk you are producing for your baby.  Your belly button may stick out.  You may notice more swelling in your hands,  face, or ankles.  You may have increased tingling or numbness in your hands, arms, and legs. The skin on your belly may also feel numb.  You may feel short of breath because of your expanding uterus.  You may have more problems sleeping. This can be caused by the size of your belly, increased need to urinate, and an increase in your body's metabolism.  You may notice the fetus "dropping," or moving lower in your abdomen (lightening).  You may have increased vaginal discharge.  You may notice your joints feel loose and you may have pain around your pelvic bone.  What to expect at prenatal visits You will have prenatal exams every 2 weeks until week 36. Then you will have weekly prenatal exams. During a routine prenatal visit:  You will be weighed to make sure you and the baby are growing normally.  Your blood pressure will be taken.  Your abdomen will be measured to track your baby's growth.  The fetal heartbeat will be listened to.  Any test results from the previous visit will be discussed.  You may have a cervical check near your due date to see if your cervix has softened or thinned (effaced).  You will be tested for Group B streptococcus. This happens between 35 and 37 weeks.  Your health care provider may ask you:  What your birth plan is.  How you are feeling.  If you are feeling the baby move.  If you have had   any abnormal symptoms, such as leaking fluid, bleeding, severe headaches, or abdominal cramping.  If you are using any tobacco products, including cigarettes, chewing tobacco, and electronic cigarettes.  If you have any questions.  Other tests or screenings that may be performed during your third trimester include:  Blood tests that check for low iron levels (anemia).  Fetal testing to check the health, activity level, and growth of the fetus. Testing is done if you have certain medical conditions or if there are problems during the  pregnancy.  Nonstress test (NST). This test checks the health of your baby to make sure there are no signs of problems, such as the baby not getting enough oxygen. During this test, a belt is placed around your belly. The baby is made to move, and its heart rate is monitored during movement.  What is false labor? False labor is a condition in which you feel small, irregular tightenings of the muscles in the womb (contractions) that usually go away with rest, changing position, or drinking water. These are called Braxton Hicks contractions. Contractions may last for hours, days, or even weeks before true labor sets in. If contractions come at regular intervals, become more frequent, increase in intensity, or become painful, you should see your health care provider. What are the signs of labor?  Abdominal cramps.  Regular contractions that start at 10 minutes apart and become stronger and more frequent with time.  Contractions that start on the top of the uterus and spread down to the lower abdomen and back.  Increased pelvic pressure and dull back pain.  A watery or bloody mucus discharge that comes from the vagina.  Leaking of amniotic fluid. This is also known as your "water breaking." It could be a slow trickle or a gush. Let your health care provider know if it has a color or strange odor. If you have any of these signs, call your health care provider right away, even if it is before your due date. Follow these instructions at home: Medicines  Follow your health care provider's instructions regarding medicine use. Specific medicines may be either safe or unsafe to take during pregnancy.  Take a prenatal vitamin that contains at least 600 micrograms (mcg) of folic acid.  If you develop constipation, try taking a stool softener if your health care provider approves. Eating and drinking  Eat a balanced diet that includes fresh fruits and vegetables, whole grains, good sources of protein  such as meat, eggs, or tofu, and low-fat dairy. Your health care provider will help you determine the amount of weight gain that is right for you.  Avoid raw meat and uncooked cheese. These carry germs that can cause birth defects in the baby.  If you have low calcium intake from food, talk to your health care provider about whether you should take a daily calcium supplement.  Eat four or five small meals rather than three large meals a day.  Limit foods that are high in fat and processed sugars, such as fried and sweet foods.  To prevent constipation: ? Drink enough fluid to keep your urine clear or pale yellow. ? Eat foods that are high in fiber, such as fresh fruits and vegetables, whole grains, and beans. Activity  Exercise only as directed by your health care provider. Most women can continue their usual exercise routine during pregnancy. Try to exercise for 30 minutes at least 5 days a week. Stop exercising if you experience uterine contractions.  Avoid heavy   lifting.  Do not exercise in extreme heat or humidity, or at high altitudes.  Wear low-heel, comfortable shoes.  Practice good posture.  You may continue to have sex unless your health care provider tells you otherwise. Relieving pain and discomfort  Take frequent breaks and rest with your legs elevated if you have leg cramps or low back pain.  Take warm sitz baths to soothe any pain or discomfort caused by hemorrhoids. Use hemorrhoid cream if your health care provider approves.  Wear a good support bra to prevent discomfort from breast tenderness.  If you develop varicose veins: ? Wear support pantyhose or compression stockings as told by your healthcare provider. ? Elevate your feet for 15 minutes, 3-4 times a day. Prenatal care  Write down your questions. Take them to your prenatal visits.  Keep all your prenatal visits as told by your health care provider. This is important. Safety  Wear your seat belt at  all times when driving.  Make a list of emergency phone numbers, including numbers for family, friends, the hospital, and police and fire departments. General instructions  Avoid cat litter boxes and soil used by cats. These carry germs that can cause birth defects in the baby. If you have a cat, ask someone to clean the litter box for you.  Do not travel far distances unless it is absolutely necessary and only with the approval of your health care provider.  Do not use hot tubs, steam rooms, or saunas.  Do not drink alcohol.  Do not use any products that contain nicotine or tobacco, such as cigarettes and e-cigarettes. If you need help quitting, ask your health care provider.  Do not use any medicinal herbs or unprescribed drugs. These chemicals affect the formation and growth of the baby.  Do not douche or use tampons or scented sanitary pads.  Do not cross your legs for long periods of time.  To prepare for the arrival of your baby: ? Take prenatal classes to understand, practice, and ask questions about labor and delivery. ? Make a trial run to the hospital. ? Visit the hospital and tour the maternity area. ? Arrange for maternity or paternity leave through employers. ? Arrange for family and friends to take care of pets while you are in the hospital. ? Purchase a rear-facing car seat and make sure you know how to install it in your car. ? Pack your hospital bag. ? Prepare the baby's nursery. Make sure to remove all pillows and stuffed animals from the baby's crib to prevent suffocation.  Visit your dentist if you have not gone during your pregnancy. Use a soft toothbrush to brush your teeth and be gentle when you floss. Contact a health care provider if:  You are unsure if you are in labor or if your water has broken.  You become dizzy.  You have mild pelvic cramps, pelvic pressure, or nagging pain in your abdominal area.  You have lower back pain.  You have persistent  nausea, vomiting, or diarrhea.  You have an unusual or bad smelling vaginal discharge.  You have pain when you urinate. Get help right away if:  Your water breaks before 37 weeks.  You have regular contractions less than 5 minutes apart before 37 weeks.  You have a fever.  You are leaking fluid from your vagina.  You have spotting or bleeding from your vagina.  You have severe abdominal pain or cramping.  You have rapid weight loss or weight gain.    You have shortness of breath with chest pain.  You notice sudden or extreme swelling of your face, hands, ankles, feet, or legs.  Your baby makes fewer than 10 movements in 2 hours.  You have severe headaches that do not go away when you take medicine.  You have vision changes. Summary  The third trimester is from week 28 through week 40, months 7 through 9. The third trimester is a time when the unborn baby (fetus) is growing rapidly.  During the third trimester, your discomfort may increase as you and your baby continue to gain weight. You may have abdominal, leg, and back pain, sleeping problems, and an increased need to urinate.  During the third trimester your breasts will keep growing and they will continue to become tender. A yellow fluid (colostrum) may leak from your breasts. This is the first milk you are producing for your baby.  False labor is a condition in which you feel small, irregular tightenings of the muscles in the womb (contractions) that eventually go away. These are called Braxton Hicks contractions. Contractions may last for hours, days, or even weeks before true labor sets in.  Signs of labor can include: abdominal cramps; regular contractions that start at 10 minutes apart and become stronger and more frequent with time; watery or bloody mucus discharge that comes from the vagina; increased pelvic pressure and dull back pain; and leaking of amniotic fluid. This information is not intended to replace advice  given to you by your health care provider. Make sure you discuss any questions you have with your health care provider. Document Released: 08/24/2001 Document Revised: 02/05/2016 Document Reviewed: 10/31/2012 Elsevier Interactive Patient Education  2017 Elsevier Inc.  

## 2016-11-25 ENCOUNTER — Encounter: Payer: Self-pay | Admitting: Obstetrics & Gynecology

## 2016-11-25 ENCOUNTER — Other Ambulatory Visit (HOSPITAL_COMMUNITY)
Admission: RE | Admit: 2016-11-25 | Discharge: 2016-11-25 | Disposition: A | Discharge status: Home / Self Care | Payer: BLUE CROSS/BLUE SHIELD | Source: Ambulatory Visit | Attending: Obstetrics & Gynecology | Admitting: Obstetrics & Gynecology

## 2016-11-25 ENCOUNTER — Ambulatory Visit (INDEPENDENT_AMBULATORY_CARE_PROVIDER_SITE_OTHER): Payer: Medicaid Other | Admitting: Obstetrics & Gynecology

## 2016-11-25 VITALS — BP 110/71 | HR 79 | Wt 178.0 lb

## 2016-11-25 DIAGNOSIS — Z3493 Encounter for supervision of normal pregnancy, unspecified, third trimester: Secondary | ICD-10-CM

## 2016-11-25 DIAGNOSIS — R87610 Atypical squamous cells of undetermined significance on cytologic smear of cervix (ASC-US): Secondary | ICD-10-CM

## 2016-11-25 DIAGNOSIS — R87810 Cervical high risk human papillomavirus (HPV) DNA test positive: Secondary | ICD-10-CM | POA: Diagnosis not present

## 2016-11-25 DIAGNOSIS — Z3483 Encounter for supervision of other normal pregnancy, third trimester: Secondary | ICD-10-CM | POA: Diagnosis present

## 2016-11-25 DIAGNOSIS — Z113 Encounter for screening for infections with a predominantly sexual mode of transmission: Secondary | ICD-10-CM | POA: Insufficient documentation

## 2016-11-25 DIAGNOSIS — O98813 Other maternal infectious and parasitic diseases complicating pregnancy, third trimester: Secondary | ICD-10-CM | POA: Diagnosis not present

## 2016-11-25 DIAGNOSIS — B3731 Acute candidiasis of vulva and vagina: Secondary | ICD-10-CM

## 2016-11-25 DIAGNOSIS — B373 Candidiasis of vulva and vagina: Secondary | ICD-10-CM

## 2016-11-25 DIAGNOSIS — Z789 Other specified health status: Secondary | ICD-10-CM

## 2016-11-25 MED ORDER — FLUCONAZOLE 150 MG PO TABS: 150.0000 mg | ORAL_TABLET | Freq: Once | ORAL | 3 refills | Status: AC

## 2016-11-25 NOTE — Progress Notes (Signed)
   PRENATAL VISIT NOTE  Subjective:  Deborah Santana is a 31 y.o. G5P4004 at 8484w4d being seen today for ongoing prenatal care.  She is currently monitored for the following issues for this low-risk pregnancy and has Supervision of low-risk pregnancy; Language barrier; and ASCUS with positive high risk HPV cervical on her problem list.  Patient reports vaginal discharge.  Contractions: Not present. Vag. Bleeding: None.  Movement: Present. Denies leaking of fluid.   The following portions of the patient's history were reviewed and updated as appropriate: allergies, current medications, past family history, past medical history, past social history, past surgical history and problem list. Problem list updated.  Objective:   Vitals:   11/25/16 0855  BP: 110/71  Pulse: 79  Weight: 178 lb (80.7 kg)    Fetal Status: Fetal Heart Rate (bpm): 142   Movement: Present     General:  Alert, oriented and cooperative. Patient is in no acute distress.  Skin: Skin is warm and dry. No rash noted.   Cardiovascular: Normal heart rate noted  Respiratory: Normal respiratory effort, no problems with respiration noted  Abdomen: Soft, gravid, appropriate for gestational age. Pain/Pressure: Present     Pelvic:  Cervical exam performed        Extremities: Normal range of motion.  Edema: None  Mental Status: Normal mood and affect. Normal behavior. Normal judgment and thought content.   Assessment and Plan:  Pregnancy: G5P4004 at 1784w4d  1. Encounter for supervision of low-risk pregnancy in third trimester - Culture, beta strep (group b only) - [facility]lam[facility]  2. Language barrier Used interpreter (face to face)  3. ASCUS with positive high risk HPV cervical colpo pp  4. Candida vaginitis Curdy, whitish, green discharge (copius) No evidence of ROM (negative fern Diflcan prescribed  Term labor symptoms and general obstetric precautions including but not limited to vaginal  bleeding, contractions, leaking of fluid and fetal movement were reviewed in detail with the patient. Please refer to After Visit Summary for other counseling recommendations.  Return in about 1 week (around 12/02/2016).   Lesly DukesKelly H Leggett, MD

## 2016-11-26 LAB — OB RESULTS CONSOLE GBS: STREP GROUP B AG: NEGATIVE

## 2016-11-26 LAB — OB RESULTS CONSOLE GC/CHLAMYDIA: Gonorrhea: NEGATIVE

## 2016-11-27 LAB — GC/CHLAMYDIA PROBE AMP (~~LOC~~) NOT AT ARMC
Chlamydia: NEGATIVE
Neisseria Gonorrhea: NEGATIVE

## 2016-11-29 LAB — CULTURE, BETA STREP (GROUP B ONLY): STREP GP B CULTURE: NEGATIVE

## 2016-12-02 ENCOUNTER — Encounter: Payer: Self-pay | Admitting: Advanced Practice Midwife

## 2016-12-02 ENCOUNTER — Ambulatory Visit (INDEPENDENT_AMBULATORY_CARE_PROVIDER_SITE_OTHER): Payer: Medicaid Other | Admitting: Advanced Practice Midwife

## 2016-12-02 VITALS — BP 116/66 | HR 90 | Wt 177.9 lb

## 2016-12-02 DIAGNOSIS — Z3493 Encounter for supervision of normal pregnancy, unspecified, third trimester: Secondary | ICD-10-CM

## 2016-12-02 DIAGNOSIS — Z789 Other specified health status: Secondary | ICD-10-CM

## 2016-12-02 NOTE — Progress Notes (Signed)
[employer] interpreter Thelma BargeFrancis [phone]

## 2016-12-02 NOTE — Progress Notes (Signed)
   PRENATAL VISIT NOTE  Subjective:  Deborah Santana is a 31 y.o. G5P4004 at 8071w4d being seen today for ongoing prenatal care.  She is currently monitored for the following issues for this low-risk pregnancy and has Supervision of low-risk pregnancy; Language barrier; and ASCUS with positive high risk HPV cervical on her problem list.  Patient reports no complaints.  Contractions: Not present. Vag. Bleeding: None.  Movement: Present. Denies leaking of fluid.   The following portions of the patient's history were reviewed and updated as appropriate: allergies, current medications, past family history, past medical history, past social history, past surgical history and problem list. Problem list updated.  Objective:   Vitals:   12/02/16 0907  BP: 116/66  Pulse: 90  Weight: 177 lb 14.4 oz (80.7 kg)    Fetal Status: Fetal Heart Rate (bpm): 143   Movement: Present     General:  Alert, oriented and cooperative. Patient is in no acute distress.  Skin: Skin is warm and dry. No rash noted.   Cardiovascular: Normal heart rate noted  Respiratory: Normal respiratory effort, no problems with respiration noted  Abdomen: Soft, gravid, appropriate for gestational age. Pain/Pressure: Present     Pelvic:  Cervical exam deferred        Extremities: Normal range of motion.  Edema: None  Mental Status: Normal mood and affect. Normal behavior. Normal judgment and thought content.   Assessment and Plan:  Pregnancy: G5P4004 at 4171w4d  1. Language barrier    2.  Normal pregnancy       Reviewed signs of labor and where to go       Informed cultures were negative  Term labor symptoms and general obstetric precautions including but not limited to vaginal bleeding, contractions, leaking of fluid and fetal movement were reviewed in detail with the patient. Please refer to After Visit Summary for other counseling recommendations.   RTO 1 week   Aviva SignsMarie L Muath Hallam, CNM

## 2016-12-02 NOTE — Patient Instructions (Signed)

## 2016-12-08 ENCOUNTER — Ambulatory Visit (INDEPENDENT_AMBULATORY_CARE_PROVIDER_SITE_OTHER): Payer: BLUE CROSS/BLUE SHIELD | Admitting: Advanced Practice Midwife

## 2016-12-08 VITALS — BP 108/63 | HR 78 | Ht 63.5 in | Wt 176.8 lb

## 2016-12-08 DIAGNOSIS — Z789 Other specified health status: Secondary | ICD-10-CM

## 2016-12-08 DIAGNOSIS — Z3493 Encounter for supervision of normal pregnancy, unspecified, third trimester: Secondary | ICD-10-CM

## 2016-12-08 NOTE — Progress Notes (Signed)
Patient has a interpreter Claude MangesUwinana Laurence

## 2016-12-08 NOTE — Progress Notes (Signed)
   PRENATAL VISIT NOTE  Subjective:  Deborah Santana is a 31 y.o. G5P4004 at 3162w3d being seen today for ongoing prenatal care.  She is currently monitored for the following issues for this low-risk pregnancy and has Supervision of low-risk pregnancy; Language barrier; and ASCUS with positive high risk HPV cervical on her problem list.  Patient reports occasional contractions.  Contractions: Not present. Vag. Bleeding: None.  Movement: Present. Denies leaking of fluid.   The following portions of the patient's history were reviewed and updated as appropriate: allergies, current medications, past family history, past medical history, past social history, past surgical history and problem list. Problem list updated.  Live interpreter used.  Objective:   Vitals:   12/08/16 0916 12/08/16 0953  BP: 108/63   Pulse: 78   Weight: 176 lb 12.8 oz (80.2 kg)   Height:  5' 3.5" (1.613 m)    Fetal Status: Fetal Heart Rate (bpm): 140 Fundal Height: 38 cm Movement: Present  Presentation: Vertex  General:  Alert, oriented and cooperative. Patient is in no acute distress.  Skin: Skin is warm and dry. No rash noted.   Cardiovascular: Normal heart rate noted  Respiratory: Normal respiratory effort, no problems with respiration noted  Abdomen: Soft, gravid, appropriate for gestational age. Pain/Pressure: Absent     Pelvic:  Cervical exam deferred        Extremities: Normal range of motion.  Edema: None  Mental Status: Normal mood and affect. Normal behavior. Normal judgment and thought content.   Assessment and Plan:  Pregnancy: G5P4004 at 7162w3d  1. Encounter for supervision of low-risk pregnancy in third trimester   2. Language barrier   Term labor symptoms and general obstetric precautions including but not limited to vaginal bleeding, contractions, leaking of fluid and fetal movement were reviewed in detail with the patient. Please refer to After Visit Summary for other counseling  recommendations.  Return in about 1 week (around 12/15/2016) for ROB.  Discussed recommendation for IOL at 41 weeks. Pt plans to refuse. States only God can decide when it is time to have her baby. Discussed increased risk of stillbirth after 41 weeks. Pt verbalizes understanding.    Dorathy KinsmanVirginia Capri Raben, CNM

## 2016-12-08 NOTE — Patient Instructions (Signed)

## 2016-12-16 ENCOUNTER — Ambulatory Visit (INDEPENDENT_AMBULATORY_CARE_PROVIDER_SITE_OTHER): Payer: Medicaid Other | Admitting: Advanced Practice Midwife

## 2016-12-16 VITALS — BP 118/61 | HR 78 | Wt 176.7 lb

## 2016-12-16 DIAGNOSIS — Z3493 Encounter for supervision of normal pregnancy, unspecified, third trimester: Secondary | ICD-10-CM

## 2016-12-16 DIAGNOSIS — Z789 Other specified health status: Secondary | ICD-10-CM

## 2016-12-16 NOTE — Progress Notes (Signed)
[employer] interpreter Satie [phone] 

## 2016-12-16 NOTE — Patient Instructions (Signed)
Fetal Movement Counts  Patient Name: ________________________________________________ Patient Due Date: ____________________  What is a fetal movement count?  A fetal movement count is the number of times that you feel your baby move during a certain amount of time. This may also be called a fetal kick count. A fetal movement count is recommended for every pregnant woman. You may be asked to start counting fetal movements as early as week 28 of your pregnancy.  Pay attention to when your baby is most active. You may notice your baby's sleep and wake cycles. You may also notice things that make your baby move more. You should do a fetal movement count:  · When your baby is normally most active.  · At the same time each day.    A good time to count movements is while you are resting, after having something to eat and drink.  How do I count fetal movements?  1. Find a quiet, comfortable area. Sit, or lie down on your side.  2. Write down the date, the start time and stop time, and the number of movements that you felt between those two times. Take this information with you to your health care visits.  3. For 2 hours, count kicks, flutters, swishes, rolls, and jabs. You should feel at least 10 movements during 2 hours.  4. You may stop counting after you have felt 10 movements.  5. If you do not feel 10 movements in 2 hours, have something to eat and drink. Then, keep resting and counting for 1 hour. If you feel at least 4 movements during that hour, you may stop counting.  Contact a health care provider if:  · You feel fewer than 4 movements in 2 hours.  · Your baby is not moving like he or she usually does.  Date: ____________ Start time: ____________ Stop time: ____________ Movements: ____________  Date: ____________ Start time: ____________ Stop time: ____________ Movements: ____________  Date: ____________ Start time: ____________ Stop time: ____________ Movements: ____________  Date: ____________ Start time:  ____________ Stop time: ____________ Movements: ____________  Date: ____________ Start time: ____________ Stop time: ____________ Movements: ____________  Date: ____________ Start time: ____________ Stop time: ____________ Movements: ____________  Date: ____________ Start time: ____________ Stop time: ____________ Movements: ____________  Date: ____________ Start time: ____________ Stop time: ____________ Movements: ____________  Date: ____________ Start time: ____________ Stop time: ____________ Movements: ____________  This information is not intended to replace advice given to you by your health care provider. Make sure you discuss any questions you have with your health care provider.  Document Released: 09/29/2006 Document Revised: 04/28/2016 Document Reviewed: 10/09/2015  Elsevier Interactive Patient Education © 2017 [employer] Inc.

## 2016-12-16 NOTE — Progress Notes (Signed)
   PRENATAL VISIT NOTE  Subjective:  Deborah Santana is a 31 y.o. G5P4004 at [redacted]w[redacted]d being seen today for ongoing prenatal care.  She is currently monitored for the following issues for this low-risk pregnancy and has Supervision of low-risk pregnancy; Language barrier; and ASCUS with positive high risk HPV cervical on her problem list.  Patient reports rare contractions.  Contractions: Not present. Vag. Bleeding: None.  Movement: Present. Denies leaking of fluid.   The following portions of the patient's history were reviewed and updated as appropriate: allergies, current medications, past family history, past medical history, past social history, past surgical history and problem list. Problem list updated.  Video interpreter used.  Objective:   Vitals:   12/16/16 0924  BP: 118/61  Pulse: 78  Weight: 176 lb 11.2 oz (80.2 kg)    Fetal Status: Fetal Heart Rate (bpm): 140 Fundal Height: 39 cm Movement: Present  Presentation: Vertex  General:  Alert, oriented and cooperative. Patient is in no acute distress.  Skin: Skin is warm and dry. No rash noted.   Cardiovascular: Normal heart rate noted  Respiratory: Normal respiratory effort, no problems with respiration noted  Abdomen: Soft, gravid, appropriate for gestational age. Pain/Pressure: Absent     Pelvic:  Cervical exam performed Dilation: 1 Effacement (%): Thick Station: Ballotable  Extremities: Normal range of motion.  Edema: None  Mental Status: Normal mood and affect. Normal behavior. Normal judgment and thought content.   Assessment and Plan:  Pregnancy: G5P4004 at [redacted]w[redacted]d  Supervision of normal pregnancy  Term labor symptoms and general obstetric precautions including but not limited to vaginal bleeding, contractions, leaking of fluid and fetal movement were reviewed in detail with the patient. Please refer to After Visit Summary for other counseling recommendations.  Return in about 1 week (around 12/23/2016) for  ROB/NST.   Dorathy Kinsman, CNM

## 2016-12-21 ENCOUNTER — Inpatient Hospital Stay (HOSPITAL_COMMUNITY)
Admission: AD | Admit: 2016-12-21 | Discharge: 2016-12-22 | Disposition: A | Discharge status: Home / Self Care | Payer: Medicaid Other | Source: Ambulatory Visit | Attending: Family Medicine | Admitting: Family Medicine

## 2016-12-21 ENCOUNTER — Encounter (HOSPITAL_COMMUNITY): Payer: Self-pay

## 2016-12-21 DIAGNOSIS — O479 False labor, unspecified: Secondary | ICD-10-CM

## 2016-12-21 NOTE — MAU Note (Signed)
Pt here with c/o contractions. Denies any bleeding or leaking of fluid.

## 2016-12-22 ENCOUNTER — Encounter (HOSPITAL_COMMUNITY): Payer: Self-pay

## 2016-12-22 ENCOUNTER — Inpatient Hospital Stay (HOSPITAL_COMMUNITY)
Admission: AD | Admit: 2016-12-22 | Discharge: 2016-12-23 | DRG: 775 | Disposition: A | Discharge status: Home / Self Care | Payer: Medicaid Other | Source: Ambulatory Visit | Attending: Family Medicine | Admitting: Family Medicine

## 2016-12-22 DIAGNOSIS — O43123 Velamentous insertion of umbilical cord, third trimester: Secondary | ICD-10-CM | POA: Diagnosis present

## 2016-12-22 DIAGNOSIS — Z3A40 40 weeks gestation of pregnancy: Secondary | ICD-10-CM

## 2016-12-22 DIAGNOSIS — Z3493 Encounter for supervision of normal pregnancy, unspecified, third trimester: Secondary | ICD-10-CM

## 2016-12-22 DIAGNOSIS — O4202 Full-term premature rupture of membranes, onset of labor within 24 hours of rupture: Secondary | ICD-10-CM | POA: Diagnosis present

## 2016-12-22 LAB — TYPE AND SCREEN
ABO/RH(D): B POS
Antibody Screen: NEGATIVE

## 2016-12-22 LAB — CBC
HCT: 32.9 % — ABNORMAL LOW (ref 36.0–46.0)
Hemoglobin: 11 g/dL — ABNORMAL LOW (ref 12.0–15.0)
MCH: 26.8 pg (ref 26.0–34.0)
MCHC: 33.4 g/dL (ref 30.0–36.0)
MCV: 80 fL (ref 78.0–100.0)
Platelets: 163 K/uL (ref 150–400)
RBC: 4.11 MIL/uL (ref 3.87–5.11)
RDW: 13.7 % (ref 11.5–15.5)
WBC: 8.9 K/uL (ref 4.0–10.5)

## 2016-12-22 LAB — POCT FERN TEST: POCT Fern Test: POSITIVE

## 2016-12-22 LAB — ABO/RH: ABO/RH(D): B POS

## 2016-12-22 MED ORDER — OXYCODONE-ACETAMINOPHEN 5-325 MG PO TABS: 1.0000 | ORAL_TABLET | ORAL | Status: DC | PRN

## 2016-12-22 MED ORDER — DIPHENHYDRAMINE HCL 25 MG PO CAPS: 25.0000 mg | ORAL_CAPSULE | Freq: Four times a day (QID) | ORAL | Status: DC | PRN

## 2016-12-22 MED ORDER — LIDOCAINE HCL (PF) 1 % IJ SOLN
30.0000 mL | INTRAMUSCULAR | Status: DC | PRN
Start: 2016-12-22 — End: 2016-12-22
  Filled 2016-12-22 (×2): qty 30

## 2016-12-22 MED ORDER — ONDANSETRON HCL 4 MG/2ML IJ SOLN: 4.0000 mg | Freq: Four times a day (QID) | INTRAMUSCULAR | Status: DC | PRN

## 2016-12-22 MED ORDER — FLEET ENEMA 7-19 GM/118ML RE ENEM: 1.0000 | ENEMA | Freq: Every day | RECTAL | Status: DC | PRN

## 2016-12-22 MED ORDER — SOD CITRATE-CITRIC ACID 500-334 MG/5ML PO SOLN: 30.0000 mL | ORAL | Status: DC | PRN

## 2016-12-22 MED ORDER — COCONUT OIL OIL: 1.0000 "application " | TOPICAL_OIL | Status: DC | PRN

## 2016-12-22 MED ORDER — MEASLES, MUMPS & RUBELLA VAC ~~LOC~~ INJ: 0.5000 mL | INJECTION | Freq: Once | SUBCUTANEOUS | Status: DC

## 2016-12-22 MED ORDER — BISACODYL 10 MG RE SUPP: 10.0000 mg | Freq: Every day | RECTAL | Status: DC | PRN

## 2016-12-22 MED ORDER — LACTATED RINGERS IV SOLN: 500.0000 mL | INTRAVENOUS | Status: DC | PRN

## 2016-12-22 MED ORDER — SIMETHICONE 80 MG PO CHEW: 80.0000 mg | CHEWABLE_TABLET | ORAL | Status: DC | PRN

## 2016-12-22 MED ORDER — BENZOCAINE-MENTHOL 20-0.5 % EX AERO: 1.0000 "application " | INHALATION_SPRAY | CUTANEOUS | Status: DC | PRN

## 2016-12-22 MED ORDER — IBUPROFEN 600 MG PO TABS
600.0000 mg | ORAL_TABLET | Freq: Four times a day (QID) | ORAL | Status: DC
  Administered 2016-12-22 – 2016-12-23 (×5): 600 mg via ORAL
  Filled 2016-12-22 (×5): qty 1

## 2016-12-22 MED ORDER — OXYTOCIN 40 UNITS IN LACTATED RINGERS INFUSION - SIMPLE MED
2.5000 [IU]/h | INTRAVENOUS | Status: DC
  Filled 2016-12-22 (×2): qty 1000

## 2016-12-22 MED ORDER — FLEET ENEMA 7-19 GM/118ML RE ENEM: 1.0000 | ENEMA | RECTAL | Status: DC | PRN

## 2016-12-22 MED ORDER — WITCH HAZEL-GLYCERIN EX PADS: 1.0000 "application " | MEDICATED_PAD | CUTANEOUS | Status: DC | PRN

## 2016-12-22 MED ORDER — SODIUM CHLORIDE 0.9% FLUSH: 3.0000 mL | INTRAVENOUS | Status: DC | PRN

## 2016-12-22 MED ORDER — HYDROXYZINE HCL 50 MG PO TABS
50.0000 mg | ORAL_TABLET | Freq: Four times a day (QID) | ORAL | Status: DC | PRN
  Filled 2016-12-22: qty 1

## 2016-12-22 MED ORDER — PRENATAL MULTIVITAMIN CH
1.0000 | ORAL_TABLET | Freq: Every day | ORAL | Status: DC
  Administered 2016-12-22 – 2016-12-23 (×2): 1 via ORAL
  Filled 2016-12-22 (×2): qty 1

## 2016-12-22 MED ORDER — LACTATED RINGERS IV SOLN: INTRAVENOUS | Status: DC

## 2016-12-22 MED ORDER — FENTANYL CITRATE (PF) 100 MCG/2ML IJ SOLN
50.0000 ug | INTRAMUSCULAR | Status: DC | PRN
  Administered 2016-12-22: 100 ug via INTRAVENOUS
  Filled 2016-12-22: qty 2

## 2016-12-22 MED ORDER — TETANUS-DIPHTH-ACELL PERTUSSIS 5-2.5-18.5 LF-MCG/0.5 IM SUSP: 0.5000 mL | Freq: Once | INTRAMUSCULAR | Status: DC

## 2016-12-22 MED ORDER — DIBUCAINE 1 % RE OINT: 1.0000 "application " | TOPICAL_OINTMENT | RECTAL | Status: DC | PRN

## 2016-12-22 MED ORDER — OXYTOCIN BOLUS FROM INFUSION
500.0000 mL | Freq: Once | INTRAVENOUS | Status: AC
  Administered 2016-12-22: 500 mL via INTRAVENOUS

## 2016-12-22 MED ORDER — ONDANSETRON HCL 4 MG PO TABS: 4.0000 mg | ORAL_TABLET | ORAL | Status: DC | PRN

## 2016-12-22 MED ORDER — ACETAMINOPHEN 325 MG PO TABS: 650.0000 mg | ORAL_TABLET | ORAL | Status: DC | PRN

## 2016-12-22 MED ORDER — SODIUM CHLORIDE 0.9 % IV SOLN: 250.0000 mL | INTRAVENOUS | Status: DC | PRN

## 2016-12-22 MED ORDER — ZOLPIDEM TARTRATE 5 MG PO TABS: 5.0000 mg | ORAL_TABLET | Freq: Every evening | ORAL | Status: DC | PRN

## 2016-12-22 MED ORDER — OXYCODONE-ACETAMINOPHEN 5-325 MG PO TABS: 2.0000 | ORAL_TABLET | ORAL | Status: DC | PRN

## 2016-12-22 MED ORDER — SENNOSIDES-DOCUSATE SODIUM 8.6-50 MG PO TABS
2.0000 | ORAL_TABLET | ORAL | Status: DC
  Administered 2016-12-23: 2 via ORAL
  Filled 2016-12-22: qty 2

## 2016-12-22 MED ORDER — SODIUM CHLORIDE 0.9% FLUSH
3.0000 mL | Freq: Two times a day (BID) | INTRAVENOUS | Status: DC
  Administered 2016-12-22 – 2016-12-23 (×2): 3 mL via INTRAVENOUS

## 2016-12-22 MED ORDER — ONDANSETRON HCL 4 MG/2ML IJ SOLN: 4.0000 mg | INTRAMUSCULAR | Status: DC | PRN

## 2016-12-22 NOTE — Progress Notes (Signed)
Report called to K. Booker CNM; she will place admission orders shortly.

## 2016-12-22 NOTE — MAU Note (Signed)
Pt here by EMS with rupture of membranes and continued contractions.

## 2016-12-22 NOTE — Lactation Note (Signed)
This note was copied from a baby's chart. Lactation Consultation Note  Patient Name: Deborah Santana [id number] Date: 12/22/2016 Reason for consult: Initial assessment  Baby 8 hours old. Used [employer] interpreter "Hasa" [phone]. Mom reports that she nursed each of her 4 older children for 2 years each. Mom declined assistance with latching and reports that she has no issues at this time. MOB reports that she is seeing yellow first milk when she latches the baby. Enc mom to offer lots of STS and nurse with cues. Mom aware of OP/BFSG and LC phone line assistance after D/C.   Maternal Data Has patient been taught Hand Expression?: Yes Does the patient have breastfeeding experience prior to this delivery?: Yes  Feeding    LATCH Score/Interventions                      Lactation Tools Discussed/Used     Consult Status Consult Status: Follow-up Date: 12/23/16 Follow-up type: In-patient    Sherlyn Hay 12/22/2016, 2:32 PM

## 2016-12-22 NOTE — MAU Note (Signed)
I have communicated with Joellyn Haff CNM and reviewed vital signs:  Vitals:   12/21/16 2314  BP: 134/76  Pulse: 98  Resp: 20  Temp: 97.5 F (36.4 C)    Vaginal exam:  Dilation: 1.5 Effacement (%): 50 Station: -2 Presentation: Vertex Exam by:: A Fines Kimberlin RN,   Also reviewed contraction pattern and that non-stress test is reactive.  It has been documented that patient is contracting irregularly with no cervical change over an hour not indicating active labor.  Patient denies any other complaints.  Based on this report provider has given order for discharge.  A discharge order and diagnosis entered by a provider.   Labor discharge instructions reviewed with patient.

## 2016-12-22 NOTE — H&P (Signed)
Deborah Santana is a 31 y.o. [id number] female at [redacted]w[redacted]d by 18wk u/s, presenting in labor w/ SROM clear fluid @ 0300. Was seen earlier in night and was 1.5cm, now 4cm.    Reports active fetal movement, contractions: regular, vaginal bleeding: none, membranes: ruptured, clear fluid. Initiated prenatal care at [facility] at 23 wks.    This pregnancy complicated by: Language barrier- Swahili ASCUS w/ +HRHPV pap, inadequate colpo @ 28wks, needs repeat pp  Prenatal History/Complications:  Term uncomplicated svb x 4 in [place]  Past Medical History: History reviewed. No pertinent past medical history.  Past Surgical History: History reviewed. No pertinent surgical history.  Obstetrical History: OB History    Gravida Para Term Preterm AB Living   SAB TAB Ectopic Multiple Live Births           4      Social History: Social History   Social History  . Marital status: Married    Spouse name: N/A  . Number of children: N/A  . Years of education: N/A   Social History Main Topics  . Smoking status: Never Smoker  . Smokeless tobacco: Never Used  . Alcohol use No  . Drug use: No  . Sexual activity: Yes    Birth control/ protection: None   Other Topics Concern  . None   Social History Narrative  . None    Family History: No family history on file.  Allergies: No Known Allergies  Prescriptions Prior to Admission  Medication Sig Dispense Refill Last Dose  . Prenatal Vit-Fe Fumarate-FA (MULTIVITAMIN-PRENATAL) 27-0.8 MG TABS tablet Take 1 tablet by mouth daily at 12 noon.   12/21/2016 at Unknown time    Review of Systems  Pertinent pos/neg as indicated in HPI  Blood pressure 134/79, pulse (!) 108, temperature 97.7 F (36.5 C), temperature source Oral, resp. rate 20, last menstrual period 03/26/2016, SpO2 100 %. General appearance: alert, cooperative and no distress Lungs: clear to auscultation bilaterally Heart: regular rate and rhythm Abdomen: gravid, soft,  non-tender Extremities: no edema DTR's 2+  Fetal monitoring: FHR: 135 bpm, variability: moderate,  Accelerations: Present,  decelerations:  Present earies Uterine activity: regular q 2-38mins Dilation: 4 Effacement (%): 70 Station: 0 Exam by:: B. parks, RN Presentation: cephalic   Prenatal labs: ABO, Rh: B/POS/-- (12/13 1332) Antibody: NEG (12/13 1332) Rubella: !Error! RPR: NON REAC (02/08 1410)  HBsAg: NEGATIVE (12/13 1332)  HIV: NONREACTIVE (12/13 1332)  GBS: Negative (03/16 0000)   2hr GTT: 67/107/103 Genetic screening:  Too late Anatomy US: normal female  Results for orders placed or performed during the hospital encounter of 12/22/16 (from the past 24 hour(s))  POCT fern test   Collection Time: 12/22/16  4:15 AM  Result Value Ref Range   POCT Fern Test Positive = ruptured amniotic membanes      Assessment:  [redacted]w[redacted]d SIUP  G5P4004  Spontaneous labor/SROM  Cat 1 FHR  GBS Negative (03/16 0000)  Plan:  Admit to BS  IV pain meds/epidural prn active labor  Expectant management  Anticipate NSVB   Plans to breast and bottlefeed  Contraception: depo  Circumcision: n/a  Marge Duncans CNM, WHNP-BC 12/22/2016, 5:10 AM

## 2016-12-22 NOTE — Anesthesia Pain Management Evaluation Note (Signed)
  CRNA Pain Management Visit Note  Patient: Deborah Santana, 31 y.o., female  "Hello I am a member of the anesthesia team at [facility]. We have an anesthesia team available at all times to provide care throughout the hospital, including epidural management and anesthesia for C-section. I don't know your plan for the delivery whether it a natural birth, water birth, IV sedation, nitrous supplementation, doula or epidural, but we want to meet your pain goals."   1.Was your pain managed to your expectations on prior hospitalizations?   No All natural deliveries in [place].  2.What is your expectation for pain management during this hospitalization?     Labor support without medications  3.How can we help you reach that goal? Desires natural  Record the patient's initial score and the patient's pain goal.   Pain: 8  Pain Goal: 10 [facility] wants you to be able to say your pain was always managed very well.  De Libman 12/22/2016

## 2016-12-23 ENCOUNTER — Other Ambulatory Visit: Payer: BLUE CROSS/BLUE SHIELD | Admitting: Obstetrics and Gynecology

## 2016-12-23 LAB — RPR: RPR Ser Ql: NONREACTIVE

## 2016-12-23 MED ORDER — IBUPROFEN 600 MG PO TABS: 600.0000 mg | ORAL_TABLET | Freq: Four times a day (QID) | ORAL | 2 refills | Status: DC

## 2016-12-23 MED ORDER — OXYCODONE-ACETAMINOPHEN 5-325 MG PO TABS: 1.0000 | ORAL_TABLET | ORAL | 0 refills | Status: DC | PRN

## 2016-12-23 MED ORDER — MEDROXYPROGESTERONE ACETATE 150 MG/ML IM SUSP: 150.0000 mg | Freq: Once | INTRAMUSCULAR | Status: DC

## 2016-12-23 MED ORDER — MEDROXYPROGESTERONE ACETATE 150 MG/ML IM SUSP: 150.0000 mg | INTRAMUSCULAR | 4 refills | Status: DC

## 2016-12-23 NOTE — Discharge Summary (Signed)
OB Discharge Summary     Patient Name: Khanh Cordner DOB: 09/01/1986 MRN: [id number]  Date of admission: 12/22/2016 Delivering MD: Wells Guiles R   Date of discharge: 12/23/2016  Admitting diagnosis: 53 WEEKS CTX Intrauterine pregnancy: [redacted]w[redacted]d    Secondary diagnosis:  Active Problems:   Indication for care or intervention in labor or delivery   Shoulder dystocia during labor and delivery  Additional problems: non-english speaking: Swahili, Colpo postpartum     Discharge diagnosis: Term Pregnancy Delivered                                                                                                Post partum procedures:MMR  Augmentation: none  Complications: None  Hospital course:  Onset of Labor With Vaginal Delivery     31y.o. yo G5P5005 at 467w3das admitted in Latent Labor on 12/22/2016. Patient had an uncomplicated labor course as follows:  Membrane Rupture Time/Date: 3:00 AM ,12/22/2016   Intrapartum Procedures: Episiotomy: None [1]                                         Lacerations:  None [1]  Patient had a delivery of a Viable infant. 12/22/2016  Information for the patient's newborn:  IsKemaria, Dedicirl Karole [[id number]]Delivery Method: Vaginal, Spontaneous Delivery (Filed from Delivery Summary)    Pateint had an uncomplicated postpartum course.  She is ambulating, tolerating a regular diet, passing flatus, and urinating well. Patient is discharged home in stable condition on 12/23/16.   Physical exam  Vitals:   12/22/16 0930 12/22/16 1310 12/22/16 1900 12/23/16 0612  BP: 125/64 (!) 124/59 115/69 120/69  Pulse: 75 73 (!) 107 85  Resp: '17  18 18  '$ Temp: 98.2 F (36.8 C) 98.3 F (36.8 C) 98.4 F (36.9 C) 97.9 F (36.6 C)  TempSrc:    Oral  SpO2:   100%    General: alert, cooperative and no distress Lochia: appropriate Uterine Fundus: firm Incision: N/A DVT Evaluation: No evidence of DVT seen on physical exam. No cords or calf tenderness. No significant  calf/ankle edema. Labs: Lab Results  Component Value Date   WBC 8.9 12/22/2016   HGB 11.0 (L) 12/22/2016   HCT 32.9 (L) 12/22/2016   MCV 80.0 12/22/2016   PLT 163 12/22/2016   CMP Latest Ref Rng & Units 07/18/2016  Glucose 65 - 99 mg/dL 110(H)  BUN 6 - 20 mg/dL 8  Creatinine 0.44 - 1.00 mg/dL 0.57  Sodium 135 - 145 mmol/L 136  Potassium 3.5 - 5.1 mmol/L 3.2(L)  Chloride 101 - 111 mmol/L 108  CO2 22 - 32 mmol/L 19(L)  Calcium 8.9 - 10.3 mg/dL 9.2  Total Protein 6.5 - 8.1 g/dL 7.1  Total Bilirubin 0.3 - 1.2 mg/dL 0.5  Alkaline Phos 38 - 126 U/L 63  AST 15 - 41 U/L 25  ALT 14 - 54 U/L 13(L)    Discharge instruction: per After Visit Summary and "Baby and Me Booklet".  After  visit meds:  Allergies as of 12/23/2016   No Known Allergies     Medication List    TAKE these medications   ibuprofen 600 MG tablet Commonly known as:  ADVIL,MOTRIN Take 1 tablet (600 mg total) by mouth every 6 (six) hours.   medroxyPROGESTERone 150 MG/ML injection Commonly known as:  DEPO-PROVERA Inject 1 mL (150 mg total) into the muscle every 3 (three) months.   multivitamin-prenatal 27-0.8 MG Tabs tablet Take 1 tablet by mouth daily at 12 noon.   oxyCODONE-acetaminophen 5-325 MG tablet Commonly known as:  ROXICET Take 1-2 tablets by mouth every 4 (four) hours as needed.       Diet: routine diet  Activity: Advance as tolerated. Pelvic rest for 6 weeks.   Outpatient follow up:6 weeks Follow up Appt: Future Appointments Date Time Provider [place]  02/02/2017 3:20 PM Seabron Spates, CNM [facility]   Follow up Visit:No Follow-up on file.  Postpartum contraception: Depo Provera  Newborn Data: Live born female  Birth Weight: 7 lb 3 oz (3260 g) APGAR: 7, 8  Baby Feeding: Breast Disposition:home with mother   12/23/2016 Morene Crocker, CNM

## 2016-12-23 NOTE — Progress Notes (Signed)
Post Partum Day #1 Subjective: no complaints, up ad lib, voiding and tolerating PO  Objective: Blood pressure 120/69, pulse 85, temperature 97.9 F (36.6 C), temperature source Oral, resp. rate 18, last menstrual period 03/26/2016, SpO2 100 %, unknown if currently breastfeeding.  Physical Exam:  General: alert, cooperative and no distress Lochia: appropriate Uterine Fundus: firm Incision: N/A DVT Evaluation: No evidence of DVT seen on physical exam. No cords or calf tenderness. No significant calf/ankle edema.   Recent Labs  12/22/16 0415  HGB 11.0*  HCT 32.9*    Assessment/Plan: Discharge home, Breastfeeding and Contraception Depo injections   LOS: 1 day   Roe Coombs, CNM 12/23/2016, 8:10 AM

## 2016-12-23 NOTE — Progress Notes (Signed)
RN used video interpreter Imran [phone] to assess patients comfort level and needs. RN offered to order food patient declined at this time stating she "doesn't feel like eating" will notify RN when ready. Patient denied any pain or excessive bleeding. Patient will call RN with any changes in pain or bleeding. All questions were answered and patient denies any needs at this time.

## 2016-12-23 NOTE — Discharge Instructions (Signed)

## 2016-12-23 NOTE — Progress Notes (Signed)
Discharge education complete, discharge instructions and follow up appointment discussed using video interpreter, Abidiaziz [phone]. Patient verbalized understanding.

## 2017-02-02 ENCOUNTER — Encounter: Payer: Self-pay | Admitting: Advanced Practice Midwife

## 2017-02-02 ENCOUNTER — Ambulatory Visit (INDEPENDENT_AMBULATORY_CARE_PROVIDER_SITE_OTHER): Payer: Medicaid Other | Admitting: Advanced Practice Midwife

## 2017-02-02 VITALS — BP 127/71 | HR 87 | Wt 176.0 lb

## 2017-02-02 DIAGNOSIS — Z789 Other specified health status: Secondary | ICD-10-CM

## 2017-02-02 DIAGNOSIS — Z3042 Encounter for surveillance of injectable contraceptive: Secondary | ICD-10-CM

## 2017-02-02 MED ORDER — MEDROXYPROGESTERONE ACETATE 150 MG/ML IM SUSP
150.0000 mg | Freq: Once | INTRAMUSCULAR | Status: AC
  Administered 2017-02-02: 150 mg via INTRAMUSCULAR

## 2017-02-02 NOTE — Progress Notes (Signed)
Subjective:     Deborah Santana is a 31 y.o. female who presents for a postpartum visit. She is 6 weeks postpartum following a spontaneous vaginal delivery. I have fully reviewed the prenatal and intrapartum course. The delivery was at 40weeks* gestational weeks. Outcome: spontaneous vaginal delivery. Anesthesia: epidural. Postpartum course has been uneventful.  She did have a short shoulder dystocia with delivery.. Baby's course has been unremarkable. Baby is feeding by breast. Bleeding no bleeding. Bowel function is normal. Bladder function is normal. Patient is sexually active. Contraception method is none. Postpartum depression screening: Neg.  Had ASCUS pap with + HPV    Needs postpartum Colpo per Dr Vergie LivingPickens  The following portions of the patient's history were reviewed and updated as appropriate: allergies, current medications, past family history, past medical history, past social history, past surgical history and problem list.  Review of Systems Pertinent items are noted in HPI.   Objective:    BP 127/71   Pulse 87   Wt 176 lb (79.8 kg)   BMI 30.69 kg/m   General:  alert, cooperative, no distress and moderately obese   Breasts:  inspection negative, no nipple discharge or bleeding, no masses or nodularity palpable  Lungs: clear to auscultation bilaterally  Heart:  regular rate and rhythm, S1, S2 normal, no murmur, click, rub or gallop  Abdomen: soft, non-tender; bowel sounds normal; no masses,  no organomegaly   Vulva:  not evaluated  Vagina: not evaluated  Cervix:  n/a  Corpus: not examined  Adnexa:  not evaluated  Rectal Exam: Not performed.        Assessment:     Normal  postpartum exam. Pap smear not done at today's visit.   Plan:    1. Contraception: Depo-Provera injections 2. States has not had intercourse with husband since delivery.  Will give Depo Today. 3. Follow up in: 1 week for Coloposcopy.

## 2017-02-02 NOTE — Patient Instructions (Signed)

## 2017-02-18 ENCOUNTER — Ambulatory Visit: Payer: Medicaid Other | Admitting: Family Medicine

## 2017-04-01 NOTE — Congregational Nurse Program (Signed)
Congregational Nurse Program Note  Date of Encounter: 04/01/2017  Past Medical History: No past medical history on file.  Encounter Details:     CNP Questionnaire - 04/01/17 1500      Patient Demographics   Is this a new or existing patient? New   Patient is considered a/an Refugee   Race African     Patient Assistance   Location of Patient Assistance [place]   Patient's financial/insurance status Medicaid   Uninsured Patient ([place] Card/Care Connects) No   Patient referred to apply for the following financial assistance Not Applicable   Food insecurities addressed Not Applicable   Transportation assistance No   Assistance securing medications No   Educational health offerings Exercise/physical activity;Health literacy;Navigating the healthcare system;Safety;Nutrition     Encounter Details   Primary purpose of visit Other  blood pressure check   Was an Emergency Department visit averted? Not Applicable   Does patient have a medical provider? Yes   Patient referred to Not Applicable   Was a mental health screening completed? (GAINS tool) No   Does patient have dental issues? No   Does patient have vision issues? No   Does your patient have an abnormal blood pressure today? No   Since previous encounter, have you referred patient for abnormal blood pressure that resulted in a new diagnosis or medication change? No   Does your patient have an abnormal blood glucose today? No   Since previous encounter, have you referred patient for abnormal blood glucose that resulted in a new diagnosis or medication change? No   Was there a life-saving intervention made? No    Client stopped by the center for blood pressure check. Health education offered. Nicole Cellaorothy Muhoro RN BSN PCCN CNP [phone]

## 2017-04-20 ENCOUNTER — Ambulatory Visit: Payer: Medicaid Other

## 2017-07-29 NOTE — Congregational Nurse Program (Signed)
Congregational Nurse Program Note  Date of Encounter: 07/29/2017  Past Medical History: No past medical history on file.  Encounter Details: CNP Questionnaire - 07/29/17 1200      Questionnaire   Patient Status  Refugee    Race  African    Location Patient Served At  Not Applicable    Insurance  Medicaid    Uninsured  Not Applicable    Food  No food insecurities    Housing/Utilities  Yes, have permanent housing    Transportation  No transportation needs    Interpersonal Safety  Yes, feel physically and emotionally safe where you currently live    Medication  No medication insecurities    Medical Provider  Yes    Referrals  Not Applicable    ED Visit Averted  Not Applicable    Life-Saving Intervention Made  Not Applicable      Ms Deborah Santana came by to have blood pressure check  And to see employment case manager.same done. I have also provided her with toothpaste and tooth brushes for her family. Nicole Cellaorothy Muhoro RN BSN PCCN CNP [phone]

## 2018-04-27 ENCOUNTER — Other Ambulatory Visit: Payer: Self-pay

## 2018-04-27 ENCOUNTER — Inpatient Hospital Stay (HOSPITAL_COMMUNITY)
Admission: AD | Admit: 2018-04-27 | Discharge: 2018-04-27 | Disposition: A | Discharge status: Home / Self Care | Payer: Medicaid Other | Attending: Obstetrics and Gynecology | Admitting: Obstetrics and Gynecology

## 2018-04-27 ENCOUNTER — Inpatient Hospital Stay (HOSPITAL_COMMUNITY): Payer: Medicaid Other

## 2018-04-27 ENCOUNTER — Encounter (HOSPITAL_COMMUNITY): Payer: Self-pay | Admitting: *Deleted

## 2018-04-27 DIAGNOSIS — Z3481 Encounter for supervision of other normal pregnancy, first trimester: Secondary | ICD-10-CM

## 2018-04-27 DIAGNOSIS — R109 Unspecified abdominal pain: Secondary | ICD-10-CM | POA: Diagnosis not present

## 2018-04-27 DIAGNOSIS — B9689 Other specified bacterial agents as the cause of diseases classified elsewhere: Secondary | ICD-10-CM | POA: Insufficient documentation

## 2018-04-27 DIAGNOSIS — O26891 Other specified pregnancy related conditions, first trimester: Secondary | ICD-10-CM

## 2018-04-27 DIAGNOSIS — N760 Acute vaginitis: Secondary | ICD-10-CM

## 2018-04-27 DIAGNOSIS — Z3A10 10 weeks gestation of pregnancy: Secondary | ICD-10-CM | POA: Insufficient documentation

## 2018-04-27 DIAGNOSIS — O23591 Infection of other part of genital tract in pregnancy, first trimester: Secondary | ICD-10-CM | POA: Insufficient documentation

## 2018-04-27 HISTORY — DX: Other specified health status: Z78.9

## 2018-04-27 LAB — CBC
HEMATOCRIT: 35.7 % — AB (ref 36.0–46.0)
HEMOGLOBIN: 12.5 g/dL (ref 12.0–15.0)
MCH: 28.1 pg (ref 26.0–34.0)
MCHC: 35 g/dL (ref 30.0–36.0)
MCV: 80.2 fL (ref 78.0–100.0)
Platelets: 157 10*3/uL (ref 150–400)
RBC: 4.45 MIL/uL (ref 3.87–5.11)
RDW: 13.4 % (ref 11.5–15.5)
WBC: 5.6 10*3/uL (ref 4.0–10.5)

## 2018-04-27 LAB — WET PREP, GENITAL
Sperm: NONE SEEN
TRICH WET PREP: NONE SEEN
Yeast Wet Prep HPF POC: NONE SEEN

## 2018-04-27 LAB — URINALYSIS, ROUTINE W REFLEX MICROSCOPIC
Bilirubin Urine: NEGATIVE
Glucose, UA: NEGATIVE mg/dL
Hgb urine dipstick: NEGATIVE
Ketones, ur: NEGATIVE mg/dL
LEUKOCYTES UA: NEGATIVE
NITRITE: NEGATIVE
PH: 6 (ref 5.0–8.0)
Protein, ur: NEGATIVE mg/dL
SPECIFIC GRAVITY, URINE: 1.027 (ref 1.005–1.030)

## 2018-04-27 LAB — POCT PREGNANCY, URINE: PREG TEST UR: POSITIVE — AB

## 2018-04-27 LAB — HCG, QUANTITATIVE, PREGNANCY: hCG, Beta Chain, Quant, S: 117425 m[IU]/mL — ABNORMAL HIGH (ref <5)

## 2018-04-27 MED ORDER — METRONIDAZOLE 500 MG PO TABS: 500.0000 mg | ORAL_TABLET | Freq: Two times a day (BID) | ORAL | 0 refills | Status: DC

## 2018-04-27 NOTE — MAU Note (Signed)
Pt presents with c/o abdominal cramping and lower back pain.  Reports pain began in January and has persistently gotten worse. Denies VB.

## 2018-04-27 NOTE — MAU Provider Note (Signed)
History     CSN: [id number]  Arrival date and time: 04/27/18 [id number]   None     Chief Complaint  Patient presents with  . Abdominal Pain  . Back Pain   HPI  Deborah Santana is a 32 y.o. [id number] at 3262w3d who presents to MAU with chief complaint of abdominal pain, new onset in early June. Pain is "sharp", starts in the middle of her abdomen and radiates to her lower back. Denies aggravating or alleviating factors.  OB History    Gravida  6   Para  5   Term  5   Preterm      AB      Living  5     SAB      TAB      Ectopic      Multiple  0   Live Births  5           Past Medical History:  Diagnosis Date  . Medical history non-contributory     Past Surgical History:  Procedure Laterality Date  . NO PAST SURGERIES      Family History  Problem Relation Age of Onset  . Diabetes Father     Social History   Tobacco Use  . Smoking status: Never Smoker  . Smokeless tobacco: Never Used  Substance Use Topics  . Alcohol use: No  . Drug use: No    Allergies: No Known Allergies  Medications Prior to Admission  Medication Sig Dispense Refill Last Dose  . ibuprofen (ADVIL,MOTRIN) 600 MG tablet Take 1 tablet (600 mg total) by mouth every 6 (six) hours. (Patient not taking: Reported on 02/02/2017) 120 tablet 2 Not Taking  . medroxyPROGESTERone (DEPO-PROVERA) 150 MG/ML injection Inject 1 mL (150 mg total) into the muscle every 3 (three) months. (Patient not taking: Reported on 02/02/2017) 1 mL 4 Not Taking  . oxyCODONE-acetaminophen (ROXICET) 5-325 MG tablet Take 1-2 tablets by mouth every 4 (four) hours as needed. (Patient not taking: Reported on 02/02/2017) 30 tablet 0 Not Taking  . Prenatal Vit-Fe Fumarate-FA (MULTIVITAMIN-PRENATAL) 27-0.8 MG TABS tablet Take 1 tablet by mouth daily at 12 noon.   Not Taking    Review of Systems  Constitutional: Negative for chills, fatigue and fever.  Gastrointestinal: Positive for abdominal pain. Negative for nausea and  vomiting.  Genitourinary: Negative for vaginal bleeding, vaginal discharge and vaginal pain.  Neurological: Negative for headaches.  All other systems reviewed and are negative.  Physical Exam   Blood pressure 125/76, pulse 74, temperature 98.1 F (36.7 C), temperature source Oral, resp. rate 20, height 5' 6.14" (1.68 m), weight 81.9 kg, last menstrual period 02/13/2018, SpO2 100 %, unknown if currently breastfeeding.  Physical Exam  Nursing note and vitals reviewed. Constitutional: She is oriented to person, place, and time. She appears well-developed and well-nourished.  Cardiovascular: Normal rate, normal heart sounds and intact distal pulses.  Respiratory: Effort normal and breath sounds normal.  GI: Soft. Bowel sounds are normal.  Genitourinary: Vagina normal and uterus normal.  Musculoskeletal: Normal range of motion.  Neurological: She is alert and oriented to person, place, and time. She has normal reflexes.  Skin: Skin is warm and dry.  Psychiatric: She has a normal mood and affect. Her behavior is normal.    MAU Course  Procedures  MDM -- Patient Vitals for the past 24 hrs:  BP Temp Temp src Pulse Resp SpO2 Height Weight  04/27/18 1202 124/63 97.7 F (36.5 C) Oral 74 18 99 % - -  04/27/18 1000 125/76 98.1 F (36.7 C) Oral 74 20 100 % - -  04/27/18 0948 - - - - - - 5' 6.14" (1.68 m) 81.9 kg    Orders Placed This Encounter  Procedures  . Wet prep, genital  . Culture, OB Urine  . US OB Comp Less 14 Wks  . Urinalysis, Routine w reflex microscopic  . CBC  . hCG, quantitative, pregnancy  . Pregnancy, urine POC  . Discharge patient   Results for orders placed or performed during the hospital encounter of 04/27/18 (from the past 24 hour(s))  Urinalysis, Routine w reflex microscopic     Status: Abnormal   Collection Time: 04/27/18 10:12 AM  Result Value Ref Range   Color, Urine YELLOW YELLOW   APPearance HAZY (A) CLEAR   Specific Gravity, Urine 1.027 1.005 -  1.030   pH 6.0 5.0 - 8.0   Glucose, UA NEGATIVE NEGATIVE mg/dL   Hgb urine dipstick NEGATIVE NEGATIVE   Bilirubin Urine NEGATIVE NEGATIVE   Ketones, ur NEGATIVE NEGATIVE mg/dL   Protein, ur NEGATIVE NEGATIVE mg/dL   Nitrite NEGATIVE NEGATIVE   Leukocytes, UA NEGATIVE NEGATIVE  Pregnancy, urine POC     Status: Abnormal   Collection Time: 04/27/18 10:15 AM  Result Value Ref Range   Preg Test, Ur POSITIVE (A) NEGATIVE  CBC     Status: Abnormal   Collection Time: 04/27/18 10:30 AM  Result Value Ref Range   WBC 5.6 4.0 - 10.5 K/uL   RBC 4.45 3.87 - 5.11 MIL/uL   Hemoglobin 12.5 12.0 - 15.0 g/dL   HCT 78.235.7 (L) 95.636.0 - 21.346.0 %   MCV 80.2 78.0 - 100.0 fL   MCH 28.1 26.0 - 34.0 pg   MCHC 35.0 30.0 - 36.0 g/dL   RDW 08.613.4 57.811.5 - 46.915.5 %   Platelets 157 150 - 400 K/uL  hCG, quantitative, pregnancy     Status: Abnormal   Collection Time: 04/27/18 10:30 AM  Result Value Ref Range   hCG, Beta Chain, Quant, S 117,425 (H) <5 mIU/mL  Wet prep, genital     Status: Abnormal   Collection Time: 04/27/18 10:38 AM  Result Value Ref Range   Yeast Wet Prep HPF POC NONE SEEN NONE SEEN   Trich, Wet Prep NONE SEEN NONE SEEN   Clue Cells Wet Prep HPF POC PRESENT (A) NONE SEEN   WBC, Wet Prep HPF POC FEW (A) NONE SEEN   Sperm NONE SEEN    [place] Ob Comp Less 14 Wks  Result Date: 04/27/2018 CLINICAL DATA:  Assess viability.  Pain. EXAM: OBSTETRIC <14 WK ULTRASOUND TECHNIQUE: Transabdominal ultrasound was performed for evaluation of the gestation as well as the maternal uterus and adnexal regions. COMPARISON:  07/18/2016 FINDINGS: Intrauterine gestational sac: Single Yolk sac:  Visualized. Embryo:  Visualized. Cardiac Activity: Visualized. Heart Rate: 168 bpm CRL:   35.58 mm   10 w 3 d                  US EDC: 11/20/2018 Subchorionic hemorrhage:  None visualized. Maternal uterus/adnexae: Right ovary: Normal Left ovary: Normal Other :None Free fluid:  None IMPRESSION: 1. Single living intrauterine gestation with an  estimated gestational age of [redacted] weeks and 3 days. No complications identified. Electronically Signed   By: Signa Kellaylor  Stroud M.D.   On: 04/27/2018 11:28    Meds ordered this encounter  Medications  . metroNIDAZOLE (FLAGYL) 500 MG tablet    Sig: Take 1 tablet (500 mg total)  by mouth 2 (two) times daily.    Dispense:  14 tablet    Refill:  0    Order Specific Question:   Supervising Provider    Answer:   Reva Bores [[id number]]   Assessment and Plan  --32 y.o. J1B1478 with IUP at [redacted]w[redacted]d by US performed today --Bacterial vaginosis, rx sent to preferred pharmacy as described above --Given list of [facility] providers in [place] and safe medications handout --Discharge home in stable condition, patient to establish care --iPad Swahili interpreter used for all patient interaction  Calvert Cantor, CNM 04/27/2018, 12:04 PM

## 2018-04-27 NOTE — Discharge Instructions (Signed)

## 2018-04-28 LAB — CULTURE, OB URINE

## 2018-04-28 LAB — GC/CHLAMYDIA PROBE AMP (~~LOC~~) NOT AT ARMC
Chlamydia: NEGATIVE
NEISSERIA GONORRHEA: NEGATIVE

## 2018-08-17 ENCOUNTER — Other Ambulatory Visit (HOSPITAL_COMMUNITY)
Admission: RE | Admit: 2018-08-17 | Discharge: 2018-08-17 | Disposition: A | Discharge status: Home / Self Care | Payer: Medicaid Other | Source: Ambulatory Visit | Attending: Obstetrics and Gynecology | Admitting: Obstetrics and Gynecology

## 2018-08-17 ENCOUNTER — Encounter: Payer: Self-pay | Admitting: Obstetrics and Gynecology

## 2018-08-17 ENCOUNTER — Ambulatory Visit (INDEPENDENT_AMBULATORY_CARE_PROVIDER_SITE_OTHER): Payer: Medicaid Other | Admitting: Obstetrics and Gynecology

## 2018-08-17 ENCOUNTER — Encounter: Payer: Self-pay | Admitting: Family Medicine

## 2018-08-17 VITALS — BP 121/73 | HR 76 | Wt 186.8 lb

## 2018-08-17 DIAGNOSIS — Z3492 Encounter for supervision of normal pregnancy, unspecified, second trimester: Secondary | ICD-10-CM | POA: Diagnosis not present

## 2018-08-17 DIAGNOSIS — O0932 Supervision of pregnancy with insufficient antenatal care, second trimester: Secondary | ICD-10-CM

## 2018-08-17 DIAGNOSIS — Z23 Encounter for immunization: Secondary | ICD-10-CM | POA: Diagnosis not present

## 2018-08-17 DIAGNOSIS — Z3482 Encounter for supervision of other normal pregnancy, second trimester: Secondary | ICD-10-CM | POA: Diagnosis not present

## 2018-08-17 DIAGNOSIS — R87610 Atypical squamous cells of undetermined significance on cytologic smear of cervix (ASC-US): Secondary | ICD-10-CM

## 2018-08-17 DIAGNOSIS — Z789 Other specified health status: Secondary | ICD-10-CM

## 2018-08-17 DIAGNOSIS — R87810 Cervical high risk human papillomavirus (HPV) DNA test positive: Secondary | ICD-10-CM

## 2018-08-17 DIAGNOSIS — Z3490 Encounter for supervision of normal pregnancy, unspecified, unspecified trimester: Secondary | ICD-10-CM | POA: Insufficient documentation

## 2018-08-17 NOTE — Progress Notes (Signed)
Subjective:   Deborah Santana is a 32 y.o. Z6X0960 at [redacted]w[redacted]d by LMP being seen today for her first obstetrical visit.  Her obstetrical history is significant for abnormal pap smear, late to prenatal care, non english speaking . Patient does intend to breast feed. Pregnancy history fully reviewed. Unplanned pregnancy. Married, living together.   Patient reports no complaints.  HISTORY: OB History  Gravida Para Term Preterm AB Living  6 5 5  0 0 5  SAB TAB Ectopic Multiple Live Births  0 0 0 0 5    # Outcome Date GA Lbr Len/2nd Weight Sex Delivery Anes PTL Lv  6 Current           5 Term 12/22/16 [redacted]w[redacted]d 03:23 / 00:05 7 lb 3 oz (3.26 kg) F Vag-Spont None  LIV     Birth Comments: WNL     Name: Rief,GIRL Katanya     Apgar1: 7  Apgar5: 8  4 Term 2015 [redacted]w[redacted]d  6 lb 2.8 oz (2.8 kg) F Vag-Spont   LIV     Birth Comments: born in [place], no complications.   3 Term 2011 [redacted]w[redacted]d  7 lb 11.5 oz (3.5 kg) M Vag-Spont   LIV     Birth Comments: born in [place],no complications  2 Term 2008 [redacted]w[redacted]d  8 lb 6 oz (3.8 kg) F Vag-Spont   LIV     Birth Comments: born in [place], no complications.  1 Term 2006 [redacted]w[redacted]d  8 lb 9.6 oz (3.9 kg) M Vag-Spont   LIV     Birth Comments: no complications, born in [place]    Last pap smear was done 2017 and was abnormal   Past Medical History:  Diagnosis Date  . Medical history non-contributory    Past Surgical History:  Procedure Laterality Date  . NO PAST SURGERIES     Family History  Problem Relation Age of Onset  . Diabetes Father    Social History   Tobacco Use  . Smoking status: Never Smoker  . Smokeless tobacco: Never Used  Substance Use Topics  . Alcohol use: No  . Drug use: No   No Known Allergies No current outpatient medications on file prior to visit.   No current facility-administered medications on file prior to visit.     Review of Systems Pertinent items noted in HPI and remainder of comprehensive ROS otherwise negative.  Exam    Vitals:   08/17/18 1522  BP: 121/73  Pulse: 76  Weight: 186 lb 12.8 oz (84.7 kg)   Fetal Heart Rate (bpm): 146  Uterus:  Fundal Height: 27 cm  Pelvic Exam: Perineum: no hemorrhoids, normal perineum   Vulva: normal external genitalia, no lesions   Vagina:  normal mucosa, normal discharge   Cervix: no lesions and normal, pap smear done.    Adnexa: normal adnexa and no mass, fullness, tenderness   Bony Pelvis: average  System: General: well-developed, well-nourished female in no acute distress   Breasts:  normal appearance, no masses or tenderness bilaterally   Skin: normal coloration and turgor, no rashes   Neurologic: oriented, normal, negative, normal mood   Extremities: normal strength, tone, and muscle mass, ROM of all joints is normal   HEENT PERRLA, extraocular movement intact and sclera clear, anicteric   Mouth/Teeth mucous membranes moist, pharynx normal without lesions and dental hygiene good   Neck supple and no masses   Cardiovascular: regular rate and rhythm   Respiratory:  no respiratory distress, normal breath  sounds   Abdomen: soft, non-tender; bowel sounds normal; no masses,  no organomegaly   Assessment:   Pregnancy: [id number] Patient Active Problem List   Diagnosis Date Noted  . Supervision of low-risk pregnancy 08/17/2018  . Late prenatal care affecting pregnancy in second trimester 08/17/2018  . ASCUS with positive high risk HPV cervical 08/31/2016  . Language barrier 08/27/2016     Plan:  1. Encounter for supervision of low-risk pregnancy in second trimester  - Interpretor used  - Culture, OB Urine - Genetic Screening - Obstetric Panel, Including HIV - SMN1 COPY NUMBER ANALYSIS (SMA Carrier Screen) - Hemoglobin A1c - Cytology - PAP( [place])  2. Late prenatal care affecting pregnancy in second trimester   3. ASCUS with positive high risk HPV cervical  - abnormal pap in 2017, colpo was insufficient.  - Pap done today    Initial labs  drawn. Continue prenatal vitamins. Genetic Screening discussed, NIPS: requested. Ultrasound discussed; fetal anatomic survey: ordered. Problem list reviewed and updated. The nature of [facility] with multiple MDs and other Advanced Practice Providers was explained to patient; also emphasized that residents, students are part of our team. Routine obstetric precautions reviewed. Return in about 4 weeks (around 09/14/2018) for Schedule 2 hour GTT, patient should be fasting. Marland Kitchen.    Rasch, Harolyn RutherfordJennifer I, NP [facility] Practice Center for [facility]

## 2018-08-19 LAB — URINE CULTURE, OB REFLEX

## 2018-08-19 LAB — CULTURE, OB URINE

## 2018-08-22 ENCOUNTER — Encounter: Payer: Self-pay | Admitting: *Deleted

## 2018-08-23 LAB — CYTOLOGY - PAP
CHLAMYDIA, DNA PROBE: NEGATIVE
Diagnosis: UNDETERMINED — AB
HPV (WINDOPATH): DETECTED — AB
HPV 16/18/45 genotyping: POSITIVE — AB
NEISSERIA GONORRHEA: NEGATIVE

## 2018-08-25 LAB — OBSTETRIC PANEL, INCLUDING HIV
BASOS: 0 %
Basophils Absolute: 0 10*3/uL (ref 0.0–0.2)
EOS (ABSOLUTE): 0.3 10*3/uL (ref 0.0–0.4)
EOS: 4 %
HEMATOCRIT: 31.6 % — AB (ref 34.0–46.6)
HEMOGLOBIN: 10.6 g/dL — AB (ref 11.1–15.9)
HIV Screen 4th Generation wRfx: NONREACTIVE
Hepatitis B Surface Ag: NEGATIVE
IMMATURE GRANS (ABS): 0 10*3/uL (ref 0.0–0.1)
IMMATURE GRANULOCYTES: 0 %
LYMPHS: 28 %
Lymphocytes Absolute: 2.1 10*3/uL (ref 0.7–3.1)
MCH: 27.2 pg (ref 26.6–33.0)
MCHC: 33.5 g/dL (ref 31.5–35.7)
MCV: 81 fL (ref 79–97)
MONOCYTES: 6 %
Monocytes Absolute: 0.5 10*3/uL (ref 0.1–0.9)
NEUTROS PCT: 62 %
Neutrophils Absolute: 4.6 10*3/uL (ref 1.4–7.0)
Platelets: 157 10*3/uL (ref 150–450)
RBC: 3.89 x10E6/uL (ref 3.77–5.28)
RDW: 13 % (ref 12.3–15.4)
RH TYPE: POSITIVE
RPR: NONREACTIVE
Rubella Antibodies, IGG: 16 index (ref >0.99)
WBC: 7.5 10*3/uL (ref 3.4–10.8)

## 2018-08-25 LAB — AB SCR+ANTIBODY ID: Antibody Screen: POSITIVE — AB

## 2018-08-25 LAB — SMN1 COPY NUMBER ANALYSIS (SMA CARRIER SCREENING)

## 2018-08-25 LAB — HEMOGLOBIN A1C
ESTIMATED AVERAGE GLUCOSE: 114 mg/dL
HEMOGLOBIN A1C: 5.6 % (ref 4.8–5.6)

## 2018-09-11 ENCOUNTER — Encounter: Payer: Self-pay | Admitting: *Deleted

## 2018-09-13 NOTE — L&D Delivery Note (Signed)
OB/GYN Faculty Practice Delivery Note  Deborah Santana is a 33 y.o. G3O7564 s/p SVD at [redacted]w[redacted]d. She was admitted for SROM 4 hours prior to delivery.   ROM: 4h 63m with clear fluid GBS Status: negative Maximum Maternal Temperature: 97.9  Labor Progress: proceeded to complete 4 hours after SROM without augmentation  Delivery Date/Time: 11/27/2018, 1029 Delivery: Called to room and patient was complete and pushing. Head delivered LOA. No nuchal cord present. Shoulder and body delivered in usual fashion. Infant with spontaneous cry, placed on mother's abdomen, dried and stimulated. Cord clamped x 2 after 1-minute delay, and cut by physician. Cord blood drawn. Placenta delivered spontaneously with gentle cord traction. Fundus firm with massage and Pitocin. Labia, perineum, vagina, and cervix inspected inspected with no lacerations.   Placenta: delivered spontaneously, intact Complications: none Lacerations: none EBL: 38mL  Infant: vigorous female  APGARs 9 and 9  weight pending  Burman Nieves, MD Family Medicine Resident

## 2018-09-14 ENCOUNTER — Ambulatory Visit (INDEPENDENT_AMBULATORY_CARE_PROVIDER_SITE_OTHER): Payer: Medicaid Other | Admitting: Obstetrics and Gynecology

## 2018-09-14 ENCOUNTER — Other Ambulatory Visit: Payer: Medicaid Other

## 2018-09-14 ENCOUNTER — Other Ambulatory Visit: Payer: Self-pay | Admitting: *Deleted

## 2018-09-14 VITALS — BP 114/70 | HR 83 | Wt 187.4 lb

## 2018-09-14 DIAGNOSIS — Z789 Other specified health status: Secondary | ICD-10-CM

## 2018-09-14 DIAGNOSIS — R87610 Atypical squamous cells of undetermined significance on cytologic smear of cervix (ASC-US): Secondary | ICD-10-CM

## 2018-09-14 DIAGNOSIS — Z3492 Encounter for supervision of normal pregnancy, unspecified, second trimester: Secondary | ICD-10-CM

## 2018-09-14 DIAGNOSIS — Z3493 Encounter for supervision of normal pregnancy, unspecified, third trimester: Secondary | ICD-10-CM

## 2018-09-14 DIAGNOSIS — R87810 Cervical high risk human papillomavirus (HPV) DNA test positive: Secondary | ICD-10-CM

## 2018-09-14 MED ORDER — BENZONATATE 100 MG PO CAPS: 200.0000 mg | ORAL_CAPSULE | Freq: Three times a day (TID) | ORAL | 0 refills | Status: DC | PRN

## 2018-09-14 NOTE — Progress Notes (Signed)
   PRENATAL VISIT NOTE  Subjective:  Deborah Santana is a 33 y.o. G6P5005 at [redacted]w[redacted]d being seen today for ongoing prenatal care.  She is currently monitored for the following issues for this low-risk pregnancy and has Language barrier; ASCUS with positive high risk HPV cervical; Supervision of low-risk pregnancy; and Late prenatal care affecting pregnancy in second trimester on their problem list.  Patient reports cough X 1 week. Has not tried anything OTC.  Contractions: Not present. Vag. Bleeding: None.  Movement: Present. Denies leaking of fluid.   The following portions of the patient's history were reviewed and updated as appropriate: allergies, current medications, past family history, past medical history, past social history, past surgical history and problem list. Problem list updated.  Objective:   Vitals:   09/14/18 0920  BP: 114/70  Pulse: 83  Weight: 187 lb 6.4 oz (85 kg)    Fetal Status: Fetal Heart Rate (bpm): 148   Movement: Present     General:  Alert, oriented and cooperative. Patient is in no acute distress.  Skin: Skin is warm and dry. No rash noted.   Cardiovascular: Normal heart rate noted  Respiratory: Normal respiratory effort, no problems with respiration noted, CTA in all   Abdomen: Soft, gravid, appropriate for gestational age.  Pain/Pressure: Absent     Pelvic: Cervical exam deferred        Extremities: Normal range of motion.  Edema: None  Mental Status: Normal mood and affect. Normal behavior. Normal judgment and thought content.   Assessment and Plan:  Pregnancy: G6P5005 at [redacted]w[redacted]d  1. Encounter for supervision of low-risk pregnancy in second trimester  - 2 hour GTT today  - complain of cough, OTC list given - RX tessalon pearles  2. ASCUS with positive high risk HPV cervical  - Discussed in detail using interpretor. Post partum colpo needed  3. Language barrier   There are no diagnoses linked to this encounter. Preterm labor symptoms and general  obstetric precautions including but not limited to vaginal bleeding, contractions, leaking of fluid and fetal movement were reviewed in detail with the patient. Please refer to After Visit Summary for other counseling recommendations.  Return in about 2 weeks (around 09/28/2018).  No future appointments.  Venia Carbon, NP

## 2018-09-14 NOTE — Patient Instructions (Signed)

## 2018-09-15 LAB — CBC
HEMATOCRIT: 30.5 % — AB (ref 34.0–46.6)
Hemoglobin: 10.1 g/dL — ABNORMAL LOW (ref 11.1–15.9)
MCH: 26.9 pg (ref 26.6–33.0)
MCHC: 33.1 g/dL (ref 31.5–35.7)
MCV: 81 fL (ref 79–97)
Platelets: 142 10*3/uL — ABNORMAL LOW (ref 150–450)
RBC: 3.75 x10E6/uL — AB (ref 3.77–5.28)
RDW: 12.9 % (ref 12.3–15.4)
WBC: 6 10*3/uL (ref 3.4–10.8)

## 2018-09-15 LAB — HIV ANTIBODY (ROUTINE TESTING W REFLEX): HIV SCREEN 4TH GENERATION: NONREACTIVE

## 2018-09-15 LAB — GLUCOSE TOLERANCE, 2 HOURS W/ 1HR
Glucose, 1 hour: 97 mg/dL (ref 65–179)
Glucose, 2 hour: 92 mg/dL (ref 65–152)
Glucose, Fasting: 76 mg/dL (ref 65–91)

## 2018-09-15 LAB — RPR: RPR Ser Ql: NONREACTIVE

## 2018-09-18 ENCOUNTER — Encounter: Payer: Self-pay | Admitting: Obstetrics and Gynecology

## 2018-09-18 DIAGNOSIS — D696 Thrombocytopenia, unspecified: Secondary | ICD-10-CM | POA: Insufficient documentation

## 2018-09-18 DIAGNOSIS — O99119 Other diseases of the blood and blood-forming organs and certain disorders involving the immune mechanism complicating pregnancy, unspecified trimester: Secondary | ICD-10-CM

## 2018-09-28 ENCOUNTER — Ambulatory Visit (INDEPENDENT_AMBULATORY_CARE_PROVIDER_SITE_OTHER): Payer: Medicaid Other | Admitting: Obstetrics and Gynecology

## 2018-09-28 ENCOUNTER — Encounter: Payer: Self-pay | Admitting: Obstetrics and Gynecology

## 2018-09-28 VITALS — BP 116/67 | HR 76 | Wt 187.0 lb

## 2018-09-28 DIAGNOSIS — O99119 Other diseases of the blood and blood-forming organs and certain disorders involving the immune mechanism complicating pregnancy, unspecified trimester: Principal | ICD-10-CM

## 2018-09-28 DIAGNOSIS — Z3A32 32 weeks gestation of pregnancy: Secondary | ICD-10-CM

## 2018-09-28 DIAGNOSIS — Z3493 Encounter for supervision of normal pregnancy, unspecified, third trimester: Secondary | ICD-10-CM

## 2018-09-28 DIAGNOSIS — D696 Thrombocytopenia, unspecified: Secondary | ICD-10-CM

## 2018-09-28 DIAGNOSIS — O99113 Other diseases of the blood and blood-forming organs and certain disorders involving the immune mechanism complicating pregnancy, third trimester: Secondary | ICD-10-CM

## 2018-09-28 DIAGNOSIS — Z3492 Encounter for supervision of normal pregnancy, unspecified, second trimester: Secondary | ICD-10-CM

## 2018-09-28 NOTE — Progress Notes (Signed)
   PRENATAL VISIT NOTE  Subjective:  Deborah Santana is a 33 y.o. G6P5005 at [redacted]w[redacted]d being seen today for ongoing prenatal care.  She is currently monitored for the following issues for this low-risk pregnancy and has Language barrier; ASCUS with positive high risk HPV cervical; Supervision of low-risk pregnancy; Late prenatal care affecting pregnancy in second trimester; and Thrombocytopenia affecting pregnancy (HCC) on their problem list.  Patient reports no complaints.  Contractions: Not present. Vag. Bleeding: None.  Movement: Present. Denies leaking of fluid.   The following portions of the patient's history were reviewed and updated as appropriate: allergies, current medications, past family history, past medical history, past social history, past surgical history and problem list. Problem list updated.  Objective:   Vitals:   09/28/18 1643  BP: 116/67  Pulse: 76  Weight: 187 lb (84.8 kg)    Fetal Status: Fetal Heart Rate (bpm): 138 Fundal Height: 32 cm Movement: Present     General:  Alert, oriented and cooperative. Patient is in no acute distress.  Skin: Skin is warm and dry. No rash noted.   Cardiovascular: Normal heart rate noted  Respiratory: Normal respiratory effort, no problems with respiration noted  Abdomen: Soft, gravid, appropriate for gestational age.  Pain/Pressure: Absent     Pelvic: Cervical exam deferred        Extremities: Normal range of motion.  Edema: None  Mental Status: Normal mood and affect. Normal behavior. Normal judgment and thought content.   Assessment and Plan:  Pregnancy: G6P5005 at [redacted]w[redacted]d  1. Thrombocytopenia affecting pregnancy (HCC)  - Platelets 157 WNL  2. Encounter for supervision of low-risk pregnancy in second trimester  Doing well Discussed labs  There are no diagnoses linked to this encounter. Preterm labor symptoms and general obstetric precautions including but not limited to vaginal bleeding, contractions, leaking of fluid and  fetal movement were reviewed in detail with the patient. Please refer to After Visit Summary for other counseling recommendations.  Return in about 2 weeks (around 10/12/2018).  Future Appointments  Date Time Provider Department Center  10/12/2018  3:15 PM Ambriana Selway, Harolyn Rutherford, NP [facility]    Venia Carbon, NP

## 2018-10-12 ENCOUNTER — Ambulatory Visit (INDEPENDENT_AMBULATORY_CARE_PROVIDER_SITE_OTHER): Payer: Medicaid Other | Admitting: Obstetrics and Gynecology

## 2018-10-12 VITALS — BP 127/68 | HR 78 | Wt 188.0 lb

## 2018-10-12 DIAGNOSIS — Z3492 Encounter for supervision of normal pregnancy, unspecified, second trimester: Secondary | ICD-10-CM

## 2018-10-12 DIAGNOSIS — Z789 Other specified health status: Secondary | ICD-10-CM

## 2018-10-12 MED ORDER — PRENATAL VITAMINS 28-0.8 MG PO TABS: 1.0000 | ORAL_TABLET | Freq: Every day | ORAL | 3 refills | Status: DC

## 2018-10-12 NOTE — Progress Notes (Signed)
   PRENATAL VISIT NOTE  Subjective:  Deborah Santana is a 33 y.o. E3O1224 at [redacted]w[redacted]d being seen today for ongoing prenatal care.  She is currently monitored for the following issues for this low-risk pregnancy and has Language barrier; ASCUS with positive high risk HPV cervical; Supervision of low-risk pregnancy; Late prenatal care affecting pregnancy in second trimester; and Thrombocytopenia affecting pregnancy (HCC) on their problem list.  Patient reports no complaints.  Contractions: Not present. Vag. Bleeding: None.  Movement: Present. Denies leaking of fluid.   The following portions of the patient's history were reviewed and updated as appropriate: allergies, current medications, past family history, past medical history, past social history, past surgical history and problem list. Problem list updated.  Objective:   Vitals:   10/12/18 1541  BP: 127/68  Pulse: 78  Weight: 188 lb (85.3 kg)    Fetal Status: Fetal Heart Rate (bpm): 144 Fundal Height: 34 cm Movement: Present     General:  Alert, oriented and cooperative. Patient is in no acute distress.  Skin: Skin is warm and dry. No rash noted.   Cardiovascular: Normal heart rate noted  Respiratory: Normal respiratory effort, no problems with respiration noted  Abdomen: Soft, gravid, appropriate for gestational age.  Pain/Pressure: Absent     Pelvic: Cervical exam deferred        Extremities: Normal range of motion.  Edema: None  Mental Status: Normal mood and affect. Normal behavior. Normal judgment and thought content.   Assessment and Plan:  Pregnancy: G6P5005 at [redacted]w[redacted]d  1. Language barrier  Interpretor used   2. Encounter for supervision of low-risk pregnancy in second trimester  Doing well Rx: Prenatal vitamins  Still plans nexplanon PP   There are no diagnoses linked to this encounter. Preterm labor symptoms and general obstetric precautions including but not limited to vaginal bleeding, contractions, leaking of fluid  and fetal movement were reviewed in detail with the patient. Please refer to After Visit Summary for other counseling recommendations.  Return in about 2 weeks (around 10/26/2018) for [facility] speaking patient. Please mark this for scheduling .  Future Appointments  Date Time Provider Department Center  10/26/2018  1:35 PM , Harolyn Rutherford, NP [facility]    Venia Carbon, NP

## 2018-10-23 ENCOUNTER — Encounter (HOSPITAL_COMMUNITY): Payer: Self-pay | Admitting: Emergency Medicine

## 2018-10-23 ENCOUNTER — Emergency Department (HOSPITAL_COMMUNITY)
Admission: EM | Admit: 2018-10-23 | Discharge: 2018-10-23 | Disposition: A | Discharge status: Home / Self Care | Payer: Medicaid Other | Attending: Emergency Medicine | Admitting: Emergency Medicine

## 2018-10-23 ENCOUNTER — Other Ambulatory Visit: Payer: Self-pay

## 2018-10-23 DIAGNOSIS — D696 Thrombocytopenia, unspecified: Secondary | ICD-10-CM | POA: Diagnosis not present

## 2018-10-23 DIAGNOSIS — Z3A35 35 weeks gestation of pregnancy: Secondary | ICD-10-CM | POA: Diagnosis not present

## 2018-10-23 DIAGNOSIS — Z79899 Other long term (current) drug therapy: Secondary | ICD-10-CM | POA: Insufficient documentation

## 2018-10-23 DIAGNOSIS — O99113 Other diseases of the blood and blood-forming organs and certain disorders involving the immune mechanism complicating pregnancy, third trimester: Secondary | ICD-10-CM | POA: Insufficient documentation

## 2018-10-23 DIAGNOSIS — O9A213 Injury, poisoning and certain other consequences of external causes complicating pregnancy, third trimester: Secondary | ICD-10-CM | POA: Diagnosis not present

## 2018-10-23 DIAGNOSIS — Z041 Encounter for examination and observation following transport accident: Secondary | ICD-10-CM | POA: Insufficient documentation

## 2018-10-23 DIAGNOSIS — R457 State of emotional shock and stress, unspecified: Secondary | ICD-10-CM | POA: Diagnosis not present

## 2018-10-23 LAB — CBC WITH DIFFERENTIAL/PLATELET
Abs Immature Granulocytes: 0.03 K/uL (ref 0.00–0.07)
Basophils Absolute: 0 K/uL (ref 0.0–0.1)
Basophils Relative: 1 %
Eosinophils Absolute: 0.3 K/uL (ref 0.0–0.5)
Eosinophils Relative: 4 %
HCT: 31.4 % — ABNORMAL LOW (ref 36.0–46.0)
Hemoglobin: 10 g/dL — ABNORMAL LOW (ref 12.0–15.0)
Immature Granulocytes: 1 %
Lymphocytes Relative: 21 %
Lymphs Abs: 1.3 K/uL (ref 0.7–4.0)
MCH: 25.9 pg — ABNORMAL LOW (ref 26.0–34.0)
MCHC: 31.8 g/dL (ref 30.0–36.0)
MCV: 81.3 fL (ref 80.0–100.0)
Monocytes Absolute: 0.6 K/uL (ref 0.1–1.0)
Monocytes Relative: 9 %
Neutro Abs: 4.1 K/uL (ref 1.7–7.7)
Neutrophils Relative %: 64 %
Platelets: 135 K/uL — ABNORMAL LOW (ref 150–400)
RBC: 3.86 MIL/uL — ABNORMAL LOW (ref 3.87–5.11)
RDW: 13.2 % (ref 11.5–15.5)
WBC: 6.3 K/uL (ref 4.0–10.5)
nRBC: 0 % (ref 0.0–0.2)

## 2018-10-23 NOTE — Discharge Instructions (Addendum)
Please read attached information. If you experience any new or worsening signs or symptoms please return to the emergency room for evaluation. Please follow-up with your primary care provider or specialist as discussed.  °

## 2018-10-23 NOTE — Progress Notes (Signed)
Spoke with Dr. Vergie Living. Pt has thrombocytopenia. Last platelet count was 142 on 09/14/2018. Orders received to draw a CBC. Says he is reviewing the pt's tracing.
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
## 2018-10-23 NOTE — ED Notes (Signed)
Paged OB rapid response

## 2018-10-23 NOTE — ED Notes (Signed)
OB Rapid Response RN at bedside. 

## 2018-10-23 NOTE — ED Notes (Signed)
Patient verbalizes understanding of discharge instructions. Opportunity for questioning and answers were provided. Armband removed by staff, pt discharged from ED ambulatory.   

## 2018-10-23 NOTE — Progress Notes (Signed)
Pt instructed to go to [facility] if she has contractions, vaginal bleeding or leaking of fluid.She has an appointment at the [facility] on Oct 26 2018. Pt verbalizes understanding. Says she does not need an interpreter. ED staff notified.
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
## 2018-10-26 ENCOUNTER — Ambulatory Visit (INDEPENDENT_AMBULATORY_CARE_PROVIDER_SITE_OTHER): Payer: Medicaid Other | Admitting: Obstetrics and Gynecology

## 2018-10-26 ENCOUNTER — Other Ambulatory Visit (HOSPITAL_COMMUNITY)
Admission: RE | Admit: 2018-10-26 | Discharge: 2018-10-26 | Disposition: A | Discharge status: Home / Self Care | Payer: Medicaid Other | Source: Ambulatory Visit | Attending: Obstetrics and Gynecology | Admitting: Obstetrics and Gynecology

## 2018-10-26 VITALS — BP 109/49 | HR 95 | Wt 189.0 lb

## 2018-10-26 DIAGNOSIS — Z3492 Encounter for supervision of normal pregnancy, unspecified, second trimester: Secondary | ICD-10-CM | POA: Diagnosis not present

## 2018-10-26 DIAGNOSIS — Z3493 Encounter for supervision of normal pregnancy, unspecified, third trimester: Secondary | ICD-10-CM

## 2018-10-26 DIAGNOSIS — Z3A36 36 weeks gestation of pregnancy: Secondary | ICD-10-CM

## 2018-10-26 LAB — OB RESULTS CONSOLE GC/CHLAMYDIA: Gonorrhea: NEGATIVE

## 2018-10-26 LAB — OB RESULTS CONSOLE GBS: GBS: NEGATIVE

## 2018-10-26 MED ORDER — PROMETHAZINE-DM 6.25-15 MG/5ML PO SYRP: 5.0000 mL | ORAL_SOLUTION | Freq: Every evening | ORAL | 0 refills | Status: DC | PRN

## 2018-10-26 MED ORDER — GUAIFENESIN 100 MG/5ML PO SOLN: 5.0000 mL | ORAL | 0 refills | Status: DC | PRN

## 2018-10-26 MED ORDER — CETIRIZINE HCL 10 MG PO TABS: 10.0000 mg | ORAL_TABLET | Freq: Every day | ORAL | 0 refills | Status: DC

## 2018-10-26 NOTE — Progress Notes (Signed)
   PRENATAL VISIT NOTE  Subjective:  Deborah Santana is a 33 y.o. K2I0973 at [redacted]w[redacted]d being seen today for ongoing prenatal care.  She is currently monitored for the following issues for this low-risk pregnancy and has Language barrier; ASCUS with positive high risk HPV cervical; Supervision of low-risk pregnancy; Late prenatal care affecting pregnancy in second trimester; and Thrombocytopenia affecting pregnancy (HCC) on their problem list.  Patient reports cough, sneezing, runny nose. Symptoms started yesterday .  Contractions: Not present. Vag. Bleeding: None.  Movement: Present. Denies leaking of fluid.   The following portions of the patient's history were reviewed and updated as appropriate: allergies, current medications, past family history, past medical history, past social history, past surgical history and problem list. Problem list updated.  Objective:   Vitals:   10/26/18 1336  BP: (!) 109/49  Pulse: 95  Weight: 189 lb (85.7 kg)    Fetal Status: Fetal Heart Rate (bpm): 152 Fundal Height: 36 cm Movement: Present  Presentation: Undeterminable  General:  Alert, oriented and cooperative. Patient is in no acute distress.  Skin: Skin is warm and dry. No rash noted.   Cardiovascular: Normal heart rate noted  Respiratory: Normal respiratory effort, no problems with respiration noted  Abdomen: Soft, gravid, appropriate for gestational age.  Pain/Pressure: Absent     Pelvic: Cervical exam deferred Dilation: Fingertip Effacement (%): Thick Station: -3  Extremities: Normal range of motion.  Edema: None  Mental Status: Normal mood and affect. Normal behavior. Normal judgment and thought content.   Assessment and Plan:  Pregnancy: G6P5005 at [redacted]w[redacted]d  1. Encounter for supervision of low-risk pregnancy in second trimester  - Culture, beta strep (group b only) - GC/Chlamydia probe amp ()not at [facility] - Rx Zyrtec and robitussin. Call the office if symptoms persist past 7 days.    Preterm labor symptoms and general obstetric precautions including but not limited to vaginal bleeding, contractions, leaking of fluid and fetal movement were reviewed in detail with the patient. Please refer to After Visit Summary for other counseling recommendations.  No follow-ups on file.  Future Appointments  Date Time Provider Department Center  11/02/2018  2:55 PM Deborah Santana, Deborah Rutherford, NP [facility]  11/09/2018  9:15 AM Deborah Santana, Deborah Rutherford, NP [facility]  11/16/2018 10:15 AM Deborah Santana, Deborah Rutherford, NP [facility]  11/23/2018  8:55 AM Deborah Santana, Deborah Rutherford, NP [facility]    Venia Carbon, NP

## 2018-10-27 LAB — GC/CHLAMYDIA PROBE AMP (~~LOC~~) NOT AT ARMC
CHLAMYDIA, DNA PROBE: NEGATIVE
Neisseria Gonorrhea: NEGATIVE

## 2018-10-30 LAB — CULTURE, BETA STREP (GROUP B ONLY): STREP GP B CULTURE: NEGATIVE

## 2018-11-02 ENCOUNTER — Encounter: Payer: Medicaid Other | Admitting: Obstetrics and Gynecology

## 2018-11-09 ENCOUNTER — Ambulatory Visit (INDEPENDENT_AMBULATORY_CARE_PROVIDER_SITE_OTHER): Payer: Medicaid Other | Admitting: Obstetrics and Gynecology

## 2018-11-09 VITALS — BP 119/54 | HR 73 | Wt 189.0 lb

## 2018-11-09 DIAGNOSIS — Z3A38 38 weeks gestation of pregnancy: Secondary | ICD-10-CM

## 2018-11-09 DIAGNOSIS — R87610 Atypical squamous cells of undetermined significance on cytologic smear of cervix (ASC-US): Secondary | ICD-10-CM

## 2018-11-09 DIAGNOSIS — O99119 Other diseases of the blood and blood-forming organs and certain disorders involving the immune mechanism complicating pregnancy, unspecified trimester: Secondary | ICD-10-CM

## 2018-11-09 DIAGNOSIS — Z789 Other specified health status: Secondary | ICD-10-CM

## 2018-11-09 DIAGNOSIS — Z758 Other problems related to medical facilities and other health care: Secondary | ICD-10-CM

## 2018-11-09 DIAGNOSIS — D696 Thrombocytopenia, unspecified: Secondary | ICD-10-CM

## 2018-11-09 DIAGNOSIS — Z3493 Encounter for supervision of normal pregnancy, unspecified, third trimester: Secondary | ICD-10-CM

## 2018-11-09 DIAGNOSIS — O99113 Other diseases of the blood and blood-forming organs and certain disorders involving the immune mechanism complicating pregnancy, third trimester: Secondary | ICD-10-CM

## 2018-11-09 DIAGNOSIS — R87810 Cervical high risk human papillomavirus (HPV) DNA test positive: Secondary | ICD-10-CM

## 2018-11-09 NOTE — Progress Notes (Signed)
   PRENATAL VISIT NOTE  Subjective:  Deborah Santana is a 33 y.o. [id number] at [redacted]w[redacted]d being seen today for ongoing prenatal care.  She is currently monitored for the following issues for this low-risk pregnancy and has Language barrier; ASCUS with positive high risk HPV cervical; Supervision of low-risk pregnancy; Late prenatal care affecting pregnancy in second trimester; and Thrombocytopenia affecting pregnancy (HCC) on their problem list.  Patient reports no complaints.  Contractions: Not present. Vag. Bleeding: None.  Movement: Present. Denies leaking of fluid.   The following portions of the patient's history were reviewed and updated as appropriate: allergies, current medications, past family history, past medical history, past social history, past surgical history and problem list. Problem list updated.  Objective:   Vitals:   11/09/18 0850  BP: (!) 119/54  Pulse: 73  Weight: 189 lb (85.7 kg)    Fetal Status: Fetal Heart Rate (bpm): 145   Movement: Present     General:  Alert, oriented and cooperative. Patient is in no acute distress.  Skin: Skin is warm and dry. No rash noted.   Cardiovascular: Normal heart rate noted  Respiratory: Normal respiratory effort, no problems with respiration noted  Abdomen: Soft, gravid, appropriate for gestational age.  Pain/Pressure: Absent     Pelvic: Cervical exam deferred        Extremities: Normal range of motion.     Mental Status: Normal mood and affect. Normal behavior. Normal judgment and thought content.   Assessment and Plan:  Pregnancy: G6P5005 at [redacted]w[redacted]d  1. Encounter for supervision of low-risk pregnancy in third trimester  Doing well Discussed new hospital and where to go for labor.  She is not interested in discussing induction at this time.   2. Language barrier  Interpretor used   3. ASCUS with positive high risk HPV cervical  Will need PP colpo   4. Thrombocytopenia affecting pregnancy (HCC)  @ 36 week platelets were  135  There are no diagnoses linked to this encounter. Term labor symptoms and general obstetric precautions including but not limited to vaginal bleeding, contractions, leaking of fluid and fetal movement were reviewed in detail with the patient. Please refer to After Visit Summary for other counseling recommendations.  No follow-ups on file.  Future Appointments  Date Time Provider Department Center  11/09/2018  9:15 AM Rasch, Harolyn Rutherford, NP [facility]  11/16/2018 10:15 AM Rasch, Harolyn Rutherford, NP [facility]  11/23/2018  8:55 AM Rasch, Harolyn Rutherford, NP [facility]    Venia Carbon, NP

## 2018-11-16 ENCOUNTER — Ambulatory Visit (INDEPENDENT_AMBULATORY_CARE_PROVIDER_SITE_OTHER): Payer: Medicaid Other | Admitting: Obstetrics and Gynecology

## 2018-11-16 VITALS — BP 123/64 | HR 71 | Wt 186.3 lb

## 2018-11-16 DIAGNOSIS — O99119 Other diseases of the blood and blood-forming organs and certain disorders involving the immune mechanism complicating pregnancy, unspecified trimester: Principal | ICD-10-CM

## 2018-11-16 DIAGNOSIS — O99019 Anemia complicating pregnancy, unspecified trimester: Secondary | ICD-10-CM | POA: Insufficient documentation

## 2018-11-16 DIAGNOSIS — Z789 Other specified health status: Secondary | ICD-10-CM

## 2018-11-16 DIAGNOSIS — O99013 Anemia complicating pregnancy, third trimester: Secondary | ICD-10-CM

## 2018-11-16 DIAGNOSIS — Z3493 Encounter for supervision of normal pregnancy, unspecified, third trimester: Secondary | ICD-10-CM

## 2018-11-16 DIAGNOSIS — O99113 Other diseases of the blood and blood-forming organs and certain disorders involving the immune mechanism complicating pregnancy, third trimester: Secondary | ICD-10-CM

## 2018-11-16 DIAGNOSIS — Z3A39 39 weeks gestation of pregnancy: Secondary | ICD-10-CM

## 2018-11-16 DIAGNOSIS — D696 Thrombocytopenia, unspecified: Secondary | ICD-10-CM

## 2018-11-16 NOTE — Progress Notes (Signed)
   PRENATAL VISIT NOTE  Subjective:  Deborah Santana is a 33 y.o. [id number] at [redacted]w[redacted]d being seen today for ongoing prenatal care.  She is currently monitored for the following issues for this low-risk pregnancy and has Language barrier; ASCUS with positive high risk HPV cervical; Supervision of low-risk pregnancy; Late prenatal care affecting pregnancy in second trimester; and Thrombocytopenia affecting pregnancy (HCC) on their problem list.  Patient reports no complaints.  Contractions: Not present. Vag. Bleeding: None.  Movement: Present. Denies leaking of fluid.   The following portions of the patient's history were reviewed and updated as appropriate: allergies, current medications, past family history, past medical history, past social history, past surgical history and problem list. Problem list updated.  Objective:   Vitals:   11/16/18 1057  BP: 123/64  Pulse: 71  Weight: 186 lb 4.8 oz (84.5 kg)    Fetal Status: Fetal Heart Rate (bpm): 135 Fundal Height: 39 cm Movement: Present     General:  Alert, oriented and cooperative. Patient is in no acute distress.  Skin: Skin is warm and dry. No rash noted.   Cardiovascular: Normal heart rate noted  Respiratory: Normal respiratory effort, no problems with respiration noted  Abdomen: Soft, gravid, appropriate for gestational age.  Pain/Pressure: Absent     Pelvic: Cervical exam performed Dilation: 1 Effacement (%): Thick Station: -3  Extremities: Normal range of motion.  Edema: Trace  Mental Status: Normal mood and affect. Normal behavior. Normal judgment and thought content.   Assessment and Plan:  Pregnancy: G6P5005 at [redacted]w[redacted]d  1. Thrombocytopenia affecting pregnancy (HCC)  Last platelets 135  2. Language barrier  In house interpretor used   3. Encounter for supervision of low-risk pregnancy in third trimester  Doing well BPP and NST next visit Induction scheduled; patient does not know if she will come for induction. She believes  she will go into labor on her own.   There are no diagnoses linked to this encounter. Term labor symptoms and general obstetric precautions including but not limited to vaginal bleeding, contractions, leaking of fluid and fetal movement were reviewed in detail with the patient. Please refer to After Visit Summary for other counseling recommendations.  Return in about 1 week (around 11/23/2018) for For NST and BPP- post date .  Future Appointments  Date Time Provider Department Center  11/23/2018  8:55 AM Abem Shaddix, Harolyn Rutherford, NP [facility]  11/27/2018  7:30 AM [facility] ROOM [facility] None    Venia Carbon, NP

## 2018-11-21 ENCOUNTER — Encounter (HOSPITAL_COMMUNITY): Payer: Self-pay | Admitting: *Deleted

## 2018-11-21 ENCOUNTER — Telehealth (HOSPITAL_COMMUNITY): Payer: Self-pay | Admitting: *Deleted

## 2018-11-21 NOTE — Telephone Encounter (Signed)
Preadmission screen ?Interpreter number [id number] ?

## 2018-11-23 ENCOUNTER — Encounter: Payer: Medicaid Other | Admitting: Obstetrics and Gynecology

## 2018-11-23 ENCOUNTER — Ambulatory Visit (INDEPENDENT_AMBULATORY_CARE_PROVIDER_SITE_OTHER): Payer: Medicaid Other | Admitting: Obstetrics and Gynecology

## 2018-11-23 ENCOUNTER — Other Ambulatory Visit: Payer: Self-pay

## 2018-11-23 ENCOUNTER — Ambulatory Visit: Payer: Self-pay

## 2018-11-23 VITALS — BP 120/67 | HR 86 | Wt 188.7 lb

## 2018-11-23 DIAGNOSIS — Z3493 Encounter for supervision of normal pregnancy, unspecified, third trimester: Secondary | ICD-10-CM

## 2018-11-23 DIAGNOSIS — Z3A40 40 weeks gestation of pregnancy: Secondary | ICD-10-CM | POA: Diagnosis not present

## 2018-11-23 DIAGNOSIS — O480 Post-term pregnancy: Secondary | ICD-10-CM | POA: Diagnosis not present

## 2018-11-23 NOTE — Progress Notes (Signed)
Interpreter Fani Mwasiti present for encounter. IOL scheduled on 3/16 @ 0730.  Pt informed that the ultrasound is considered a limited OB ultrasound and is not intended to be a complete ultrasound exam.  Patient also informed that the ultrasound is not being completed with the intent of assessing for fetal or placental anomalies or any pelvic abnormalities.  Explained that the purpose of today's ultrasound is to assess for presentation, BPP and amniotic fluid volume.  Patient acknowledges the purpose of the exam and the limitations of the study.

## 2018-11-23 NOTE — Patient Instructions (Signed)

## 2018-11-23 NOTE — Progress Notes (Signed)
   PRENATAL VISIT NOTE  Subjective:  Deborah Santana is a 33 y.o. [id number] at [redacted]w[redacted]d being seen today for ongoing prenatal care.  She is currently monitored for the following issues for this low-risk pregnancy and has Language barrier; ASCUS with positive high risk HPV cervical; Supervision of low-risk pregnancy; Late prenatal care affecting pregnancy in second trimester; Thrombocytopenia affecting pregnancy (HCC); Anemia in pregnancy; and Post term pregnancy, antepartum condition or complication on their problem list.  Patient reports no complaints.  Contractions: Not present. Vag. Bleeding: None.  Movement: Present. Denies leaking of fluid.   The following portions of the patient's history were reviewed and updated as appropriate: allergies, current medications, past family history, past medical history, past social history, past surgical history and problem list.   Objective:   Vitals:   11/23/18 1341  BP: 120/67  Pulse: 86  Weight: 188 lb 11.2 oz (85.6 kg)    Fetal Status: Fetal Heart Rate (bpm): NST   Movement: Present  Presentation: Vertex  General:  Alert, oriented and cooperative. Patient is in no acute distress.  Skin: Skin is warm and dry. No rash noted.   Cardiovascular: Normal heart rate noted  Respiratory: Normal respiratory effort, no problems with respiration noted  Abdomen: Soft, gravid, appropriate for gestational age.  Pain/Pressure: Absent     Pelvic: Cervical exam performed Dilation: 1.5 Effacement (%): Thick Station: -3  Extremities: Normal range of motion.     Mental Status: Normal mood and affect. Normal behavior. Normal judgment and thought content.   Assessment and Plan:  Pregnancy: G6P5005 at [redacted]w[redacted]d 1. Encounter for supervision of low-risk pregnancy in third trimester  Induction scheduled for Monday 3/16 @ 0730 Patient to arrive in MAU around 3 pm Sunday for foley insertion.  GBS negative Membranes stripped today per patient request. No spotting noted.   2.  Post term pregnancy, antepartum condition or complication  - US FETAL BPP W/NONSTRESS; Future - reactive NST today: 15x15 accels, no decels.   Term labor symptoms and general obstetric precautions including but not limited to vaginal bleeding, contractions, leaking of fluid and fetal movement were reviewed in detail with the patient. Please refer to After Visit Summary for other counseling recommendations.   Return in about 3 days (around 11/26/2018) for Go to the MAU at the [facility] [place] [facility] on Sunday from 3-5 pm for foley insertion.  Future Appointments  Date Time Provider Department Center  11/27/2018  7:30 AM [facility] SCHED ROOM [facility] None    Venia Carbon, NP

## 2018-11-27 ENCOUNTER — Inpatient Hospital Stay (HOSPITAL_COMMUNITY)
Admission: RE | Admit: 2018-11-27 | Discharge: 2018-11-28 | DRG: 806 | Disposition: A | Discharge status: Home / Self Care | Payer: Medicaid Other | Attending: Obstetrics & Gynecology | Admitting: Obstetrics & Gynecology

## 2018-11-27 ENCOUNTER — Inpatient Hospital Stay (HOSPITAL_COMMUNITY): Payer: Medicaid Other

## 2018-11-27 ENCOUNTER — Encounter (HOSPITAL_COMMUNITY): Payer: Self-pay | Admitting: *Deleted

## 2018-11-27 DIAGNOSIS — O9912 Other diseases of the blood and blood-forming organs and certain disorders involving the immune mechanism complicating childbirth: Secondary | ICD-10-CM | POA: Diagnosis present

## 2018-11-27 DIAGNOSIS — Z3A41 41 weeks gestation of pregnancy: Secondary | ICD-10-CM

## 2018-11-27 DIAGNOSIS — O0932 Supervision of pregnancy with insufficient antenatal care, second trimester: Secondary | ICD-10-CM

## 2018-11-27 DIAGNOSIS — D696 Thrombocytopenia, unspecified: Secondary | ICD-10-CM | POA: Diagnosis present

## 2018-11-27 DIAGNOSIS — R87610 Atypical squamous cells of undetermined significance on cytologic smear of cervix (ASC-US): Secondary | ICD-10-CM

## 2018-11-27 DIAGNOSIS — O480 Post-term pregnancy: Principal | ICD-10-CM | POA: Diagnosis present

## 2018-11-27 DIAGNOSIS — D649 Anemia, unspecified: Secondary | ICD-10-CM | POA: Diagnosis present

## 2018-11-27 DIAGNOSIS — R87810 Cervical high risk human papillomavirus (HPV) DNA test positive: Secondary | ICD-10-CM

## 2018-11-27 DIAGNOSIS — O9902 Anemia complicating childbirth: Secondary | ICD-10-CM | POA: Diagnosis present

## 2018-11-27 DIAGNOSIS — Z3493 Encounter for supervision of normal pregnancy, unspecified, third trimester: Secondary | ICD-10-CM

## 2018-11-27 DIAGNOSIS — O99019 Anemia complicating pregnancy, unspecified trimester: Secondary | ICD-10-CM

## 2018-11-27 DIAGNOSIS — O99119 Other diseases of the blood and blood-forming organs and certain disorders involving the immune mechanism complicating pregnancy, unspecified trimester: Secondary | ICD-10-CM

## 2018-11-27 LAB — CBC
HCT: 31.5 % — ABNORMAL LOW (ref 36.0–46.0)
Hemoglobin: 10.2 g/dL — ABNORMAL LOW (ref 12.0–15.0)
MCH: 24.9 pg — AB (ref 26.0–34.0)
MCHC: 32.4 g/dL (ref 30.0–36.0)
MCV: 77 fL — ABNORMAL LOW (ref 80.0–100.0)
Platelets: 145 10*3/uL — ABNORMAL LOW (ref 150–400)
RBC: 4.09 MIL/uL (ref 3.87–5.11)
RDW: 13.7 % (ref 11.5–15.5)
WBC: 9.5 10*3/uL (ref 4.0–10.5)
nRBC: 0 % (ref 0.0–0.2)

## 2018-11-27 LAB — RPR: RPR Ser Ql: NONREACTIVE

## 2018-11-27 MED ORDER — OXYCODONE-ACETAMINOPHEN 5-325 MG PO TABS: 1.0000 | ORAL_TABLET | ORAL | Status: DC | PRN

## 2018-11-27 MED ORDER — COCONUT OIL OIL: 1.0000 "application " | TOPICAL_OIL | Status: DC | PRN

## 2018-11-27 MED ORDER — LACTATED RINGERS IV SOLN
INTRAVENOUS | Status: DC
  Administered 2018-11-27: 08:00:00 via INTRAVENOUS

## 2018-11-27 MED ORDER — SIMETHICONE 80 MG PO CHEW: 80.0000 mg | CHEWABLE_TABLET | ORAL | Status: DC | PRN

## 2018-11-27 MED ORDER — BENZOCAINE-MENTHOL 20-0.5 % EX AERO: 1.0000 "application " | INHALATION_SPRAY | CUTANEOUS | Status: DC | PRN

## 2018-11-27 MED ORDER — FENTANYL CITRATE (PF) 100 MCG/2ML IJ SOLN
50.0000 ug | Freq: Once | INTRAMUSCULAR | Status: AC
  Administered 2018-11-27: 50 ug via INTRAVENOUS
  Filled 2018-11-27: qty 2

## 2018-11-27 MED ORDER — SOD CITRATE-CITRIC ACID 500-334 MG/5ML PO SOLN: 30.0000 mL | ORAL | Status: DC | PRN

## 2018-11-27 MED ORDER — WITCH HAZEL-GLYCERIN EX PADS: 1.0000 "application " | MEDICATED_PAD | CUTANEOUS | Status: DC | PRN

## 2018-11-27 MED ORDER — ONDANSETRON HCL 4 MG/2ML IJ SOLN: 4.0000 mg | INTRAMUSCULAR | Status: DC | PRN

## 2018-11-27 MED ORDER — TETANUS-DIPHTH-ACELL PERTUSSIS 5-2.5-18.5 LF-MCG/0.5 IM SUSP: 0.5000 mL | Freq: Once | INTRAMUSCULAR | Status: DC

## 2018-11-27 MED ORDER — LIDOCAINE HCL (PF) 1 % IJ SOLN
30.0000 mL | INTRAMUSCULAR | Status: DC | PRN
  Filled 2018-11-27: qty 30

## 2018-11-27 MED ORDER — PRENATAL MULTIVITAMIN CH
1.0000 | ORAL_TABLET | Freq: Every day | ORAL | Status: DC
  Administered 2018-11-28: 1 via ORAL
  Filled 2018-11-27: qty 1

## 2018-11-27 MED ORDER — IBUPROFEN 600 MG PO TABS
600.0000 mg | ORAL_TABLET | Freq: Four times a day (QID) | ORAL | Status: DC
  Administered 2018-11-27 – 2018-11-28 (×3): 600 mg via ORAL
  Filled 2018-11-27 (×3): qty 1

## 2018-11-27 MED ORDER — OXYCODONE-ACETAMINOPHEN 5-325 MG PO TABS: 2.0000 | ORAL_TABLET | ORAL | Status: DC | PRN

## 2018-11-27 MED ORDER — ONDANSETRON HCL 4 MG/2ML IJ SOLN: 4.0000 mg | Freq: Four times a day (QID) | INTRAMUSCULAR | Status: DC | PRN

## 2018-11-27 MED ORDER — DIPHENHYDRAMINE HCL 25 MG PO CAPS: 25.0000 mg | ORAL_CAPSULE | Freq: Four times a day (QID) | ORAL | Status: DC | PRN

## 2018-11-27 MED ORDER — OXYTOCIN 40 UNITS IN NORMAL SALINE INFUSION - SIMPLE MED
2.5000 [IU]/h | INTRAVENOUS | Status: DC
  Administered 2018-11-27: 2.5 [IU]/h via INTRAVENOUS
  Filled 2018-11-27: qty 1000

## 2018-11-27 MED ORDER — LACTATED RINGERS IV SOLN: 500.0000 mL | INTRAVENOUS | Status: DC | PRN

## 2018-11-27 MED ORDER — OXYTOCIN BOLUS FROM INFUSION: 500.0000 mL | Freq: Once | INTRAVENOUS | Status: DC

## 2018-11-27 MED ORDER — SENNOSIDES-DOCUSATE SODIUM 8.6-50 MG PO TABS
2.0000 | ORAL_TABLET | ORAL | Status: DC
  Administered 2018-11-27: 2 via ORAL
  Filled 2018-11-27: qty 2

## 2018-11-27 MED ORDER — ONDANSETRON HCL 4 MG PO TABS: 4.0000 mg | ORAL_TABLET | ORAL | Status: DC | PRN

## 2018-11-27 MED ORDER — ACETAMINOPHEN 325 MG PO TABS
650.0000 mg | ORAL_TABLET | ORAL | Status: DC | PRN
  Administered 2018-11-27: 650 mg via ORAL
  Filled 2018-11-27: qty 2

## 2018-11-27 MED ORDER — ACETAMINOPHEN 325 MG PO TABS: 650.0000 mg | ORAL_TABLET | ORAL | Status: DC | PRN

## 2018-11-27 MED ORDER — DIBUCAINE 1 % RE OINT: 1.0000 "application " | TOPICAL_OINTMENT | RECTAL | Status: DC | PRN

## 2018-11-27 NOTE — Anesthesia Pain Management Evaluation Note (Signed)
  CRNA Pain Management Visit Note  Patient: Deborah Santana, 33 y.o., female  "Hello I am a member of the anesthesia team at [facility]. We have an anesthesia team available at all times to provide care throughout the hospital, including epidural management and anesthesia for C-section. I don't know your plan for the delivery whether it a natural birth, water birth, IV sedation, nitrous supplementation, doula or epidural, but we want to meet your pain goals."   1.Was your pain managed to your expectations on prior hospitalizations?   No prior hospitalizations  2.What is your expectation for pain management during this hospitalization?     Labor support without medications  3.How can we help you reach that goal?   Record the patient's initial score and the patient's pain goal.   Pain: 8  Pain Goal: 10 [facility] wants you to be able to say your pain was always managed very well.  Laban Emperor 11/27/2018

## 2018-11-27 NOTE — Discharge Summary (Addendum)
Postpartum Discharge Summary     Patient Name: Deborah Santana DOB: [birth year] MRN: [id number]  Date of admission: 11/27/2018 Delivering Provider: Burman Nieves A   Date of discharge: 11/28/2018 Admitting diagnosis: pregnancy Intrauterine pregnancy: [redacted]w[redacted]d     Secondary diagnosis:  Active Problems:   Post-dates pregnancy   SVD (spontaneous vaginal delivery)  Additional problems: Thrombocytopenia affecting pregnancy, language barrier, and late prenatal care      Discharge diagnosis: Term Pregnancy Delivered                                                                                                Post partum procedures:none  Augmentation: none  Complications: None  Hospital course:  Onset of Labor With Vaginal Delivery     33 y.o. yo [id number] at [redacted]w[redacted]d was admitted in Active Labor on 11/27/2018. Patient had an uncomplicated labor course as follows:  Membrane Rupture Time/Date: 6:00 AM ,11/27/2018   Intrapartum Procedures: Episiotomy: None [1]                                         Lacerations:  None [1]  Patient had a delivery of a Viable infant. 11/27/2018  Information for the patient's newborn:  Lawrencia, Deats Girl Damika [[id number]]  Delivery Method: Vaginal, Spontaneous(Filed from Delivery Summary)   Pateint had an uncomplicated postpartum course.  She is ambulating, tolerating a regular diet, passing flatus, and urinating well. Patient is discharged home in stable condition on 11/28/2018.  Magnesium Sulfate recieved: No BMZ received: No  Physical exam  Vitals:   11/27/18 1043 11/27/18 1101 11/27/18 1116 11/27/18 1131  BP: 137/78 137/74 134/79 127/69  Pulse: 90 84 79 78  Resp: 18 18 16    Temp:   98.5 F (36.9 C)   TempSrc:   Oral   Weight:      Height:       General: alert, cooperative and no distress Lochia: appropriate Uterine Fundus: firm Incision: N/A DVT Evaluation: Negative Homan's sign. No significant calf/ankle edema. Labs: Lab Results  Component Value Date    WBC 9.5 11/27/2018   HGB 10.2 (L) 11/27/2018   HCT 31.5 (L) 11/27/2018   MCV 77.0 (L) 11/27/2018   PLT 145 (L) 11/27/2018   CMP Latest Ref Rng & Units 07/18/2016  Glucose 65 - 99 mg/dL 612(A)  BUN 6 - 20 mg/dL 8  Creatinine 4.49 - 7.53 mg/dL 0.05  Sodium 110 - 211 mmol/L 136  Potassium 3.5 - 5.1 mmol/L 3.2(L)  Chloride 101 - 111 mmol/L 108  CO2 22 - 32 mmol/L 19(L)  Calcium 8.9 - 10.3 mg/dL 9.2  Total Protein 6.5 - 8.1 g/dL 7.1  Total Bilirubin 0.3 - 1.2 mg/dL 0.5  Alkaline Phos 38 - 126 U/L 63  AST 15 - 41 U/L 25  ALT 14 - 54 U/L 13(L)    Discharge instruction: per After Visit Summary and "Baby and Me Booklet".  After visit meds:  Allergies as of 11/28/2018   No Known Allergies  Medication List    STOP taking these medications   benzonatate 100 MG capsule Commonly known as:  Tessalon Perles   guaiFENesin 100 MG/5ML Soln Commonly known as:  ROBITUSSIN   promethazine-dextromethorphan 6.25-15 MG/5ML syrup Commonly known as:  PROMETHAZINE-DM     TAKE these medications   acetaminophen 325 MG tablet Commonly known as:  Tylenol Take 2 tablets (650 mg total) by mouth every 4 (four) hours as needed (for pain scale < 4).   cetirizine 10 MG tablet Commonly known as:  ZyrTEC Allergy Take 1 tablet (10 mg total) by mouth daily.   ibuprofen 600 MG tablet Commonly known as:  ADVIL,MOTRIN Take 1 tablet (600 mg total) by mouth every 6 (six) hours.   Prenatal Vitamins 28-0.8 MG Tabs Take 1 tablet by mouth daily.       Diet: routine diet  Activity: Advance as tolerated. Pelvic rest for 6 weeks.   Outpatient follow up:4 weeks Follow up Appt:No future appointments. Follow up Visit:  Please schedule this patient for Postpartum visit in: 4 weeks with the following provider: MD Low risk pregnancy complicated by: none Delivery mode:  SVD Anticipated Birth Control:  Nexplanon PP Procedures needed: Colpo  Schedule Integrated BH visit: no  Newborn Data: Live born  female  Birth Weight:   APGAR: 9, 9  Newborn Delivery   Birth date/time:  11/27/2018 10:29:00 Delivery type:  Vaginal, Spontaneous     Baby Feeding: Breast Disposition:home with mother  Burman Nieves, MD Faculty Service, Resident   OB FELLOW DISCHARGE ATTESTATION  I have seen and examined this patient and agree with above documentation in the resident's note.   Marcy Siren, D.O. OB Fellow  11/28/2018, 12:19 PM

## 2018-11-27 NOTE — Lactation Note (Signed)
This note was copied from a baby's chart. Lactation Consultation Note  Patient Name: Deborah Deborah Santana VOJJK'K Date: 11/27/2018   Baby Deborah Santana now 4 hours old.  In crib.  Swahili interpreter, Fani, Present. Mom reports she exclusively breastfed her first 4 babies until 2 years.  They were born in [place].  Last baby born here she did both bf and formula hfeeding for 1 and a half years.  Mom reports she will go to work when infant is about 54 months of age. Mom reports no challanages with breastfeeding any of her of her previous children. Mom reports she wants to do both so the other kids can feed her too.  Discussed putting breastmilk in bottles.  Mom reports she doesn't want to do that only formula in bottles. Urged exclusive breastfeeding.  Reasons discussed.   Unable to find any info on powdered formula prep in swahili and risks of formula. Infant cuing and eating blanket as we left room.  Offered to hand to mom.  Mom reports she can stay there,.  Gave cone breastfeeding resources and [facility] breastfeeding consultation services handouts in english.  Mom able to understand some english.  Urged mom to call lactation as needed.   Maternal Data    Feeding Feeding Type: Breast Fed  LATCH Score                   Interventions    Lactation Tools Discussed/Used     Consult Status      Deborah Santana 11/27/2018, 3:05 PM

## 2018-11-27 NOTE — H&P (Addendum)
OBSTETRIC ADMISSION HISTORY AND PHYSICAL  Deborah Santana is a 33 y.o. female [phone] with IUP at [redacted]w[redacted]d by LMP presenting for SOL/SROM at 630 this morning, fluid was clear-white. Reports fetal movement. Denies vaginal bleeding. Feeling strong contractions every 2-5 minutes. Denies medical problems and medications other than PNV. Denies HA, RUQ pain, ShOB and LE edema.   She received her prenatal care at [facility].  Support person in labor: husband  Ultrasounds . Anatomy U/S: unremarkable   Prenatal History/Complications: . Thrombocytopenia . Anemia  Past Medical History: Past Medical History:  Diagnosis Date  . Medical history non-contributory    Past Surgical History: Past Surgical History:  Procedure Laterality Date  . NO PAST SURGERIES     Obstetrical History: OB History    Gravida  6   Para  5   Term  5   Preterm      AB      Living  5     SAB      TAB      Ectopic      Multiple  0   Live Births  5          Social History: Social History   Socioeconomic History  . Marital status: Married    Spouse name: Not on file  . Number of children: Not on file  . Years of education: Not on file  . Highest education level: Not on file  Occupational History  . Not on file  Social Needs  . Financial resource strain: Not on file  . Food insecurity:    Worry: Not on file    Inability: Not on file  . Transportation needs:    Medical: Not on file    Non-medical: Not on file  Tobacco Use  . Smoking status: Never Smoker  . Smokeless tobacco: Never Used  Substance and Sexual Activity  . Alcohol use: No  . Drug use: No  . Sexual activity: Yes    Birth control/protection: None    Comment: Patient is interested in the Depo Vera  Lifestyle  . Physical activity:    Days per week: Not on file    Minutes per session: Not on file  . Stress: Not on file  Relationships  . Social connections:    Talks on phone: Not on file    Gets together: Not on file    Attends  religious service: Not on file    Active member of club or organization: Not on file    Attends meetings of clubs or organizations: Not on file    Relationship status: Not on file  Other Topics Concern  . Not on file  Social History Narrative  . Not on file   Family History: Family History  Problem Relation Age of Onset  . Diabetes Father    Allergies: No Known Allergies  Medications Prior to Admission  Medication Sig Dispense Refill Last Dose  . benzonatate (TESSALON PERLES) 100 MG capsule Take 2 capsules (200 mg total) by mouth 3 (three) times daily as needed for cough. (Patient not taking: Reported on 09/28/2018) 20 capsule 0 Not Taking  . cetirizine (ZYRTEC ALLERGY) 10 MG tablet Take 1 tablet (10 mg total) by mouth daily. (Patient not taking: Reported on 11/23/2018) 30 tablet 0 Not Taking  . guaiFENesin (ROBITUSSIN) 100 MG/5ML SOLN Take 5 mLs (100 mg total) by mouth every 4 (four) hours as needed for cough or to loosen phlegm. (Patient not taking: Reported on 11/23/2018) 236 mL 0  Not Taking  . Prenatal Vit-Fe Fumarate-FA (PRENATAL VITAMINS) 28-0.8 MG TABS Take 1 tablet by mouth daily. (Patient not taking: Reported on 11/23/2018) 60 tablet 3 Not Taking  . promethazine-dextromethorphan (PROMETHAZINE-DM) 6.25-15 MG/5ML syrup Take 5 mLs by mouth at bedtime as needed for cough. (Patient not taking: Reported on 11/23/2018) 118 mL 0 Not Taking   Review of Systems  All systems reviewed and negative except as stated in HPI  Blood pressure 129/83, pulse 98, temperature 97.9 F (36.6 C), temperature source Oral, resp. rate 20, height 5\' 4"  (1.626 m), weight 85 kg, last menstrual period 02/13/2018, unknown if currently breastfeeding. General appearance: alert and cooperative Lungs: no respiratory distress Heart: regular rate  Abdomen: soft, non-tender; gravid  Extremities: Homans sign is negative, no sign of DVT Presentation: cephalic Fetal monitoring: baseline 140bpm, early decel, one late  decel Uterine activity: q2-4 minutes Dilation: 5 Effacement (%): 80 Station: -1 Exam by:: Raliegh Ip RN  Prenatal labs: ABO, Rh: --/--/B POS (03/16 2620) Antibody: PENDING (03/16 0756) Rubella: 16.00 (12/05 1550) RPR: Non Reactive (01/02 0928)  HBsAg: Negative (12/05 1550)  HIV: Non Reactive (01/02 0928)  GBS: Negative (02/13 0000)  Glucola: normal Genetic screening:  NIPS low risk, too late for AFP Nursing Staff Provider  Office Location [facility] Dating  Certain LMP and 10.3 week [place]  Language  Swahili Anatomy US  Scheduled   Flu Vaccine  08/17/2018 Genetic Screen  NIPS: Low risk female  AFP:  Too late    TDaP vaccine   08/17/2018 Hgb A1C or  GTT Third trimester: WNL Glucose, Fasting 65 - 91 mg/dL 76   Glucose, 1 hour 65 - 179 mg/dL 97   Glucose, 2 hour 65 - 152 mg/dL 92   B5D: 5.6   Rhogam  B positive    LAB RESULTS   Feeding Plan Breastfeed Blood Type B/Positive/-- (12/05 1550) B positive   Contraception Nexplanon  Antibody Positive, See Final Results (12/05 1550) Anti M. This antibody was not active at 37 degrees centigrade. Discussed with Dr. Adrian Blackwater. JR  Circumcision Female  Rubella 16.00 (12/05 1550)  Pediatrician  [facility]  RPR Non Reactive (12/05 1550)   Support Person Rashidi luma  HBsAg Negative (12/05 1550)   Prenatal Classes Not interested  HIV Non Reactive (12/05 1550)  BTL Consent  GBS  (For PCN allergy, check sensitivities)   VBAC Consent N/A Pap Collected today 12/5    Hgb Electro  Negative     CF Negative     SMA 2    Waterbirth  [ ]  Class [ ]  Consent [ ]  CNM visit   Prenatal Transfer Tool  Maternal Diabetes: No Genetic Screening: Normal Maternal Ultrasounds/Referrals: Normal Fetal Ultrasounds or other Referrals:  None Maternal Substance Abuse:  No Significant Maternal Medications:  None Significant Maternal Lab Results: Lab values include: Other: thrombocytopenia, platelets 145 on admission   Results for orders placed or performed  during the hospital encounter of 11/27/18 (from the past 24 hour(s))  CBC   Collection Time: 11/27/18  7:56 AM  Result Value Ref Range   WBC 9.5 4.0 - 10.5 K/uL   RBC 4.09 3.87 - 5.11 MIL/uL   Hemoglobin 10.2 (L) 12.0 - 15.0 g/dL   HCT 97.4 (L) 16.3 - 84.5 %   MCV 77.0 (L) 80.0 - 100.0 fL   MCH 24.9 (L) 26.0 - 34.0 pg   MCHC 32.4 30.0 - 36.0 g/dL   RDW 36.4 68.0 - 32.1 %  Platelets 145 (L) 150 - 400 K/uL   nRBC 0.0 0.0 - 0.2 %  Type and screen   Collection Time: 11/27/18  7:56 AM  Result Value Ref Range   ABO/RH(D) B POS    Antibody Screen PENDING    Sample Expiration      11/30/2018 Performed at [facility], [address], [place], [place] [address]    Patient Active Problem List   Diagnosis Date Noted  . Post-dates pregnancy 11/27/2018  . Post term pregnancy, antepartum condition or complication 11/23/2018  . Anemia in pregnancy 11/16/2018  . Thrombocytopenia affecting pregnancy (HCC) 09/18/2018  . Supervision of low-risk pregnancy 08/17/2018  . Late prenatal care affecting pregnancy in second trimester 08/17/2018  . ASCUS with positive high risk HPV cervical 08/31/2016  . Language barrier 08/27/2016   Assessment/Plan:  Deborah Santana is a 33 y.o. [id number] at [redacted]w[redacted]d here for SROM at approximately 0630 today. Patient initially scheduled for induction this morning for post dates. This pregnancy complicated by late to prenatal care, thrombocytopenia, ASCUS with +high risk HPV (Colpo after delivery) and language barrier.   Labor: expectant management, recheck cervix in 1 hr, consider Pitocin at that time if no change and contractions spacing  -- pain control: prn Stadol q1hr until 8cm, declines epidural  Fetal Wellbeing: EFW 6-7lbs by Leopold's. Cephalic by cervical check.  -- GBS (-) -- continuous fetal monitoring - baseline hr 140 with early decels and one late decel  Postpartum Planning -- breast/girl -- Rh+ / Tdap 08/2018  Burman Nieves, MD Family Medicine  Resident   I confirm that I have verified the information documented in the resident's note and that I have also personally performed the history, physical exam and all medical decision making activities of this service and have verified that all service and findings are accurately documented in this student's note.   Sharyon Cable, CNM 11/27/2018 11:38 AM

## 2018-11-28 LAB — CBC
HCT: 29.2 % — ABNORMAL LOW (ref 36.0–46.0)
Hemoglobin: 9.5 g/dL — ABNORMAL LOW (ref 12.0–15.0)
MCH: 24.8 pg — ABNORMAL LOW (ref 26.0–34.0)
MCHC: 32.5 g/dL (ref 30.0–36.0)
MCV: 76.2 fL — ABNORMAL LOW (ref 80.0–100.0)
Platelets: 163 10*3/uL (ref 150–400)
RBC: 3.83 MIL/uL — ABNORMAL LOW (ref 3.87–5.11)
RDW: 14 % (ref 11.5–15.5)
WBC: 10.7 10*3/uL — ABNORMAL HIGH (ref 4.0–10.5)
nRBC: 0 % (ref 0.0–0.2)

## 2018-11-28 MED ORDER — IBUPROFEN 600 MG PO TABS: 600.0000 mg | ORAL_TABLET | Freq: Four times a day (QID) | ORAL | 0 refills | Status: DC

## 2018-11-28 MED ORDER — ACETAMINOPHEN 325 MG PO TABS: 650.0000 mg | ORAL_TABLET | ORAL | 0 refills | Status: DC | PRN

## 2018-11-28 NOTE — Discharge Instructions (Signed)

## 2018-11-30 LAB — BPAM RBC
Blood Product Expiration Date: 202004132359
Blood Product Expiration Date: 202004132359
UNIT TYPE AND RH: 7300
Unit Type and Rh: 7300

## 2018-11-30 LAB — TYPE AND SCREEN
ABO/RH(D): B POS
Antibody Screen: POSITIVE
Unit division: 0
Unit division: 0

## 2018-12-03 ENCOUNTER — Other Ambulatory Visit (HOSPITAL_COMMUNITY): Payer: Self-pay | Admitting: *Deleted

## 2018-12-04 ENCOUNTER — Encounter: Payer: Self-pay | Admitting: *Deleted

## 2018-12-29 ENCOUNTER — Telehealth: Payer: Self-pay | Admitting: Obstetrics & Gynecology

## 2018-12-29 NOTE — Telephone Encounter (Signed)
Called the patient to inform of the upcoming face to face visit at our new location. Left a detailed voicemail message.

## 2019-01-01 ENCOUNTER — Telehealth: Payer: Self-pay | Admitting: Family Medicine

## 2019-01-01 ENCOUNTER — Ambulatory Visit: Payer: Medicaid Other | Admitting: Family Medicine

## 2019-01-01 NOTE — Telephone Encounter (Signed)
Called the patient in regards to the missed appointment. Her husband answered, declined to give his name. Stated he is unavailable to talk at the moment and give him a call back tomorrow.

## 2019-01-17 ENCOUNTER — Telehealth: Payer: Self-pay | Admitting: *Deleted

## 2019-01-17 NOTE — Telephone Encounter (Signed)
The patient had an interrupter call in for her to reschedule the missed appointment. She stated the patient can only come in, in the morning due to lack of transporation. She also stated she would like to schedule the appointment next month (June) if she needs to, to do an earlier morning appointment.

## 2019-02-12 ENCOUNTER — Encounter: Payer: Self-pay | Admitting: Obstetrics and Gynecology

## 2019-02-12 ENCOUNTER — Ambulatory Visit (INDEPENDENT_AMBULATORY_CARE_PROVIDER_SITE_OTHER): Payer: Medicaid Other | Admitting: Obstetrics and Gynecology

## 2019-02-12 ENCOUNTER — Other Ambulatory Visit: Payer: Self-pay

## 2019-02-12 LAB — POCT PREGNANCY, URINE: Preg Test, Ur: NEGATIVE

## 2019-02-12 NOTE — Progress Notes (Signed)
Obstetrics and Gynecology Postpartum Visit  Appointment Date: 02/12/2019  OBGYN Clinic: [facility]  Primary Care Provider: Patient, No Pcp Per  Chief Complaint:  Chief Complaint  Patient presents with  . Postpartum Care    History of Present Illness: Deborah Santana is a 33 y.o. [id number] (No LMP recorded.), seen for the above chief complaint. Her past medical history is significant for AMA   She is s/p SVD/intact perineum on 3/16; she was discharged to home on PPD#1. No period yet  Vaginal bleeding or discharge: No  Breast or formula feeding: breast and formula Intercourse: Yes, last week Contraception after delivery: no method PP depression s/s: No  Any bowel or bladder issues: No  Pap smear: ASCUS/HPV post (date: 2019)  Review of Systems: as noted in the History of Present Illness.  Patient Active Problem List   Diagnosis Date Noted  . Anemia in pregnancy 11/16/2018  . Supervision of low-risk pregnancy 08/17/2018  . Late prenatal care affecting pregnancy in second trimester 08/17/2018  . ASCUS with positive high risk HPV cervical 08/31/2016  . Language barrier 08/27/2016    Medications Shamecca Kucera had no medications administered during this visit. Current Outpatient Medications  Medication Sig Dispense Refill  . Prenatal Vit-Fe Fumarate-FA (PRENATAL VITAMINS) 28-0.8 MG TABS Take 1 tablet by mouth daily. (Patient not taking: Reported on 11/23/2018) 60 tablet 3   No current facility-administered medications for this visit.     Allergies Patient has no known allergies.  Physical Exam:  BP 126/80   Pulse 86   Ht 5\' 4"  (1.626 m)   Wt 187 lb 6.4 oz (85 kg)   Breastfeeding Yes Comment: Bottle also  BMI 32.17 kg/m  Body mass index is 32.17 kg/m.  Pre pregnancy weight: 186 lbs General appearance: Well nourished, well developed female in no acute distress.   Laboratory: UPT negative  PP Depression Screening:  EPDS score 1  Assessment: pt doing  well  Plan:  D/w her that unable to do colpo and nexplanon placement today due to recent intercourse, and I told her to please abstain for for 10-14d until the next visit and importance of f/u for her abnormal pap to make sure she doesn't have cx cancer d/w her  I d/w her that from an OBGYN POV that she is fine to go back to work.   Interpreter used.  Orders Placed This Encounter  Procedures  . Pregnancy, urine POC    RTC 10-14d for colpo and nexplanon placement.   Cornelia Copa MD Attending [facility] for [facility])

## 2019-02-26 ENCOUNTER — Telehealth: Payer: Self-pay | Admitting: Obstetrics & Gynecology

## 2019-02-26 ENCOUNTER — Ambulatory Visit (INDEPENDENT_AMBULATORY_CARE_PROVIDER_SITE_OTHER): Payer: Medicaid Other | Admitting: Obstetrics & Gynecology

## 2019-02-26 ENCOUNTER — Other Ambulatory Visit: Payer: Self-pay

## 2019-02-26 ENCOUNTER — Encounter: Payer: Self-pay | Admitting: Obstetrics & Gynecology

## 2019-02-26 VITALS — BP 120/72 | HR 83 | Temp 98.0°F | Ht 61.5 in | Wt 191.0 lb

## 2019-02-26 DIAGNOSIS — R87810 Cervical high risk human papillomavirus (HPV) DNA test positive: Secondary | ICD-10-CM

## 2019-02-26 DIAGNOSIS — R87610 Atypical squamous cells of undetermined significance on cytologic smear of cervix (ASC-US): Secondary | ICD-10-CM | POA: Diagnosis not present

## 2019-02-26 DIAGNOSIS — Z30017 Encounter for initial prescription of implantable subdermal contraceptive: Secondary | ICD-10-CM | POA: Diagnosis not present

## 2019-02-26 LAB — POCT PREGNANCY, URINE: Preg Test, Ur: NEGATIVE

## 2019-02-26 MED ORDER — ETONOGESTREL 68 MG ~~LOC~~ IMPL
68.0000 mg | DRUG_IMPLANT | Freq: Once | SUBCUTANEOUS | Status: AC
  Administered 2019-02-26: 68 mg via SUBCUTANEOUS

## 2019-02-26 NOTE — Progress Notes (Signed)
Interpreter # [phone]

## 2019-02-26 NOTE — Patient Instructions (Signed)
Colposcopy Colposcopy is a procedure to examine the lowest part of the uterus (cervix), the vagina, and the area around the vaginal opening (vulva) for abnormalities or signs of disease. The procedure is done using a lighted microscope or magnifying lens (colposcope). If any unusual cells are found during the procedure, your health care provider may remove a tissue sample for testing (biopsy). A colposcopy may be done if you:  Have an abnormal Pap test. A Pap test is a screening test that is used to check for signs of cancer or infection of the vagina, cervix, and uterus.  Have a Pap smear test in which you test positive for high-risk HPV (human papillomavirus).  Have a sore or lesion on your cervix.  Have genital warts on your vulva, vagina, or cervix.  Took certain medicines while pregnant, such as diethylstilbestrol (DES).  Have pain during sexual intercourse.  Have vaginal bleeding, especially after sexual intercourse.  Need to have a cervical polyp removed.  Need to have a lost intrauterine device (IUD) string located. Let your health care provider know about:  Any allergies you have, including allergies to prescribed medicine, latex, or iodine.  All medicines you are taking, including vitamins, herbs, eye drops, creams, and over-the-counter medicines. Bring a list of all of your medicines to your appointment.  Any problems you or family members have had with anesthetic medicines.  Any blood disorders you have.  Any surgeries you have had.  Any medical conditions you have, such as pelvic inflammatory disease (PID) or endometrial disorder.  Any history of frequent fainting.  Your menstrual cycle and what form of birth control (contraception) you use.  Your medical history, including any prior cervical treatment.  Whether you are pregnant or may be pregnant. What are the risks? Generally, this is a safe procedure. However, problems may occur, including:  Pain.   Infection, which may include a fever, bad-smelling discharge, or pelvic pain.  Bleeding or discharge.  Misdiagnosis.  Fainting and vasovagal reactions, but this is rare.  Allergic reactions to medicines.  Damage to other structures or organs. What happens before the procedure?  If you have your menstrual period or will have it at the time of your procedure, tell your health care provider. A colposcopy typically is not done during menstruation.  Continue your contraceptive practices before and after the procedure.  For 24 hours before the colposcopy: ? Do not douche. ? Do not use tampons. ? Do not use medicines, creams, or suppositories in the vagina. ? Do not have sexual intercourse.  Ask your health care provider about: ? Changing or stopping your regular medicines. This is especially important if you are taking diabetes medicines or blood thinners. ? Taking medicines such as aspirin and ibuprofen. These medicines can thin your blood. Do not take these medicines before your procedure if your health care provider instructs you not to. It is likely that your health care provider will tell you to avoid taking aspirin or medicine that contains aspirin for 7 days before the procedure.  Follow instructions from your health care provider about eating or drinking restrictions. You will likely need to eat a regular diet the day of the procedure and not skip any meals.  You may have an exam or testing. A pregnancy test will be taken on the day of the procedure.  You may have a blood or urine sample taken.  Plan to have someone take you home from the hospital or clinic.  If you will be going  home right after the procedure, plan to have someone with you for 24 hours. What happens during the procedure?  You will lie down on your back, with your feet in foot rests (stirrups).  A warmed and lubricated instrument (speculum) will be inserted into your vagina. The speculum will be used to hold  apart the walls of your vagina so your health care provider can see your cervix and the inside of your vagina.  A cotton swab will be used to place a small amount of liquid solution on the areas to be examined. This solution makes it easier to see abnormal cells. You may feel a slight burning during this part.  The colposcope will be used to scan the cervix with a bright white light. The colposcope will be held near your vulvaand will magnify your vulva, vagina, and cervix for easier examination.  Your health care provider may decide to take a biopsy. If so: ? You may be given medicine to numb the area (local anesthetic). ? Surgical instruments will be used to suck out mucus and cells through your vagina. ? You may feel mild pain while the tissue sample is removed. ? Bleeding may occur. A solution may be used to stop the bleeding. ? If a sample of tissue is needed from the inside of the cervix, a different procedure called endocervical curettage (ECC) may be completed. During this procedure, a curved instrument (curette) will be used to scrape cells from your cervix or the top of your cervix (endocervix).  Your health care provider will record the location of any abnormalities. The procedure may vary among health care providers and hospitals. What happens after the procedure?  You will lie down and rest for a few minutes. You may be offered juice or cookies.  Your blood pressure, heart rate, breathing rate, and blood oxygen level will be monitored until any medicines you were given have worn off.  You may have to wear compression stockings. These stockings help to prevent blood clots and reduce swelling in your legs.  You may have some cramping in your abdomen. This should go away after a few minutes. This information is not intended to replace advice given to you by your health care provider. Make sure you discuss any questions you have with your health care provider. Document Released:  11/20/2002 Document Revised: 04/27/2016 Document Reviewed: 04/05/2016 Elsevier Interactive Patient Education  2019 [place] is this medicine? ETONOGESTREL (et oh noe JES trel) is a contraceptive (birth control) device. It is used to prevent pregnancy. It can be used for up to 3 years. This medicine may be used for other purposes; ask your health care provider or pharmacist if you have questions. COMMON BRAND NAME(S): Implanon, Nexplanon What should I tell my health care provider before I take this medicine? They need to know if you have any of these conditions: -abnormal vaginal bleeding -blood vessel disease or blood clots -breast, cervical, endometrial, ovarian, liver, or uterine cancer -diabetes -gallbladder disease -heart disease or recent heart attack -high blood pressure -high cholesterol or triglycerides -kidney disease -liver disease -migraine headaches -seizures -stroke -tobacco smoker -an unusual or allergic reaction to etonogestrel, anesthetics or antiseptics, other medicines, foods, dyes, or preservatives -pregnant or trying to get pregnant -breast-feeding How should I use this medicine? This device is inserted just under the skin on the inner side of your upper arm by a health care professional. Talk to your pediatrician regarding the use of this medicine in children.  Special care may be needed. Overdosage: If you think you have taken too much of this medicine contact a poison control center or emergency room at once. NOTE: This medicine is only for you. Do not share this medicine with others. What if I miss a dose? This does not apply. What may interact with this medicine? Do not take this medicine with any of the following medications: -amprenavir -fosamprenavir This medicine may also interact with the following medications: -acitretin -aprepitant -armodafinil -bexarotene -bosentan -carbamazepine -certain medicines for fungal  infections like fluconazole, ketoconazole, itraconazole and voriconazole -certain medicines to treat hepatitis, HIV or AIDS -cyclosporine -felbamate -griseofulvin -lamotrigine -modafinil -oxcarbazepine -phenobarbital -phenytoin -primidone -rifabutin -rifampin -rifapentine -St. John's wort -topiramate This list may not describe all possible interactions. Give your health care provider a list of all the medicines, herbs, non-prescription drugs, or dietary supplements you use. Also tell them if you smoke, drink alcohol, or use illegal drugs. Some items may interact with your medicine. What should I watch for while using this medicine? This product does not protect you against HIV infection (AIDS) or other sexually transmitted diseases. You should be able to feel the implant by pressing your fingertips over the skin where it was inserted. Contact your doctor if you cannot feel the implant, and use a non-hormonal birth control method (such as condoms) until your doctor confirms that the implant is in place. Contact your doctor if you think that the implant may have broken or become bent while in your arm. You will receive a user card from your health care provider after the implant is inserted. The card is a record of the location of the implant in your upper arm and when it should be removed. Keep this card with your health records. What side effects may I notice from receiving this medicine? Side effects that you should report to your doctor or health care professional as soon as possible: -allergic reactions like skin rash, itching or hives, swelling of the face, lips, or tongue -breast lumps, breast tissue changes, or discharge -breathing problems -changes in emotions or moods -if you feel that the implant may have broken or bent while in your arm -high blood pressure -pain, irritation, swelling, or bruising at the insertion site -scar at site of insertion -signs of infection at the  insertion site such as fever, and skin redness, pain or discharge -signs and symptoms of a blood clot such as breathing problems; changes in vision; chest pain; severe, sudden headache; pain, swelling, warmth in the leg; trouble speaking; sudden numbness or weakness of the face, arm or leg -signs and symptoms of liver injury like dark yellow or brown urine; general ill feeling or flu-like symptoms; light-colored stools; loss of appetite; nausea; right upper belly pain; unusually weak or tired; yellowing of the eyes or skin -unusual vaginal bleeding, discharge Side effects that usually do not require medical attention (report to your doctor or health care professional if they continue or are bothersome): -acne -breast pain or tenderness -headache -irregular menstrual bleeding -nausea This list may not describe all possible side effects. Call your doctor for medical advice about side effects. You may report side effects to FDA at 1-800-FDA-1088. Where should I keep my medicine? This drug is given in a hospital or clinic and will not be stored at home. NOTE: This sheet is a summary. It may not cover all possible information. If you have questions about this medicine, talk to your doctor, pharmacist, or health care provider.  2019 Elsevier/Gold  Standard (2017-07-19 14:11:42)

## 2019-02-26 NOTE — Telephone Encounter (Signed)
Called patient, but was not able to reach her. Left a VM.

## 2019-02-26 NOTE — Progress Notes (Signed)
GYNECOLOGY OFFICE PROCEDURE NOTE  Saren Corkern is a 33 y.o. [id number] here for Nexplanon insertion.  Last pap smear was on 08/2018 and was abnormal.  No other gynecologic concerns.  ASCUS with positive high risk HPV cervical  She was scheduled for colposcopy but she did not want any specimens obtained without discussing with her husband so the procedure will be rescheduled  Nexplanon Insertion Procedure Patient identified, informed consent performed, consent signed.   Patient does understand that irregular bleeding is a very common side effect of this medication. She was advised to have backup contraception for one week after placement. Pregnancy test in clinic today was negative.  Appropriate time out taken.  Patient's left arm was prepped and draped in the usual sterile fashion. The ruler used to measure and mark insertion area.  Patient was prepped with alcohol swab and then injected with 3 ml of 1% lidocaine.  She was prepped with betadine, Nexplanon removed from packaging,  Device confirmed in needle, then inserted full length of needle and withdrawn per handbook instructions. Nexplanon was able to palpated in the patient's arm; patient palpated the insert herself. There was minimal blood loss.  Patient insertion site covered with gauze and a pressure bandage to reduce any bruising.  The patient tolerated the procedure well and was given post procedure instructions.        Woodroe Mode, MD Attending Obstetrician & Gynecologist, [place] [facility] [place]  02/26/2019   Patient ID: Betti Goodenow, female   DOB: 05-18-1986, 33 y.o.   MRN: [id number]

## 2019-03-15 ENCOUNTER — Telehealth: Payer: Self-pay | Admitting: Obstetrics and Gynecology

## 2019-03-15 NOTE — Telephone Encounter (Signed)
Called the patient to pre-screen. Left a detailed voicemail informing if the patient is experiencing any flu-like symptoms such as fever, chills, cough, or shortness of breath and/or has been in contact with anyone who is suspected of/or confirmed to have COVID19 please call back to reschedule the appointment. Upon entering the office please wear a face mask, sanitize hands, and no visitors or children due to [place] restrictions.   Her husband stated he was not with the patient, however he would give her the message.

## 2019-03-19 ENCOUNTER — Ambulatory Visit: Payer: Medicaid Other | Admitting: Obstetrics & Gynecology

## 2019-12-18 ENCOUNTER — Encounter (HOSPITAL_COMMUNITY): Payer: Self-pay

## 2019-12-18 ENCOUNTER — Ambulatory Visit (HOSPITAL_COMMUNITY)
Admission: EM | Admit: 2019-12-18 | Discharge: 2019-12-18 | Disposition: A | Discharge status: Home / Self Care | Payer: Medicaid Other | Attending: Family Medicine | Admitting: Family Medicine

## 2019-12-18 ENCOUNTER — Other Ambulatory Visit: Payer: Self-pay

## 2019-12-18 DIAGNOSIS — M545 Low back pain, unspecified: Secondary | ICD-10-CM

## 2019-12-18 DIAGNOSIS — M546 Pain in thoracic spine: Secondary | ICD-10-CM

## 2019-12-18 MED ORDER — NAPROXEN 500 MG PO TABS: 500.0000 mg | ORAL_TABLET | Freq: Two times a day (BID) | ORAL | 1 refills | Status: AC | PRN

## 2019-12-18 NOTE — ED Provider Notes (Signed)
[place]    CSN: [id number] Arrival date & time: 12/18/19  1709      History   Chief Complaint Chief Complaint  Patient presents with  . Back Pain    HPI Shahrzad Montemayor is a 34 y.o. female.   She is presenting with upper back pain as well as low back pain.  The pain is been ongoing for a few weeks.  Denies any specific inciting event.  It is worse with certain movements that she has at work.  No history of similar pain.  No numbness or tingling.  Has not tried anything for it.  Interview was conducted with an interpreter.  HPI  Past Medical History:  Diagnosis Date  . Medical history non-contributory     Patient Active Problem List   Diagnosis Date Noted  . ASCUS with positive high risk HPV cervical 08/31/2016  . Language barrier 08/27/2016    Past Surgical History:  Procedure Laterality Date  . NO PAST SURGERIES      OB History    Gravida  6   Para  6   Term  6   Preterm      AB      Living  6     SAB      TAB      Ectopic      Multiple  0   Live Births  6            Home Medications    Prior to Admission medications   Medication Sig Start Date End Date Taking? Authorizing Provider  naproxen (NAPROSYN) 500 MG tablet Take 1 tablet (500 mg total) by mouth 2 (two) times daily as needed. 12/18/19 12/17/20  Rosemarie Ax, MD    Family History Family History  Problem Relation Age of Onset  . Diabetes Father     Social History Social History   Tobacco Use  . Smoking status: Never Smoker  . Smokeless tobacco: Never Used  Substance Use Topics  . Alcohol use: No  . Drug use: No     Allergies   Patient has no known allergies.   Review of Systems Review of Systems See HPI  Physical Exam Triage Vital Signs ED Triage Vitals  Enc Vitals Group     BP 12/18/19 1723 133/81     Pulse Rate 12/18/19 1723 88     Resp 12/18/19 1723 16     Temp 12/18/19 1723 98.7 F (37.1 C)     Temp Source 12/18/19 1723 Oral     SpO2  12/18/19 1723 100 %     Weight --      Height --      Head Circumference --      Peak Flow --      Pain Score 12/18/19 1721 8     Pain Loc --      Pain Edu? --      Excl. in [place]? --    No data found.  Updated Vital Signs BP 133/81 (BP Location: Right Arm)   Pulse 88   Temp 98.7 F (37.1 C) (Oral)   Resp 16   SpO2 100%   Visual Acuity Right Eye Distance:   Left Eye Distance:   Bilateral Distance:    Right Eye Near:   Left Eye Near:    Bilateral Near:     Physical Exam Gen: NAD, alert, cooperative with exam, well-appearing ENT: normal lips, normal nasal mucosa,  Eye: normal EOM, normal  conjunctiva and lids Neuro: normal tone, normal sensation to touch Psych:  normal insight, alert and oriented MSK:  Back:  Normal back flexion and extension. Normal neck range of motion. Normal shoulder range of motion. Normal grip strength. Normal strength resistance with shrug. Neurovascularly intact   UC Treatments / Results  Labs (all labs ordered are listed, but only abnormal results are displayed) Labs Reviewed - No data to display  EKG   Radiology No results found.  Procedures Procedures (including critical care time)  Medications Ordered in UC Medications - No data to display  Initial Impression / Assessment and Plan / UC Course  I have reviewed the triage vital signs and the nursing notes.  Pertinent labs & imaging results that were available during my care of the patient were reviewed by me and considered in my medical decision making (see chart for details).     Ms. Hyde is a 34 year old female is presenting with thoracic and low back pain.  Fairly acute in nature.  Seems to be more muscular as the source.  Will provide naproxen and counseled on home exercise therapy and supportive care.  Given indications to follow-up and return.  Final Clinical Impressions(s) / UC Diagnoses   Final diagnoses:  Acute bilateral low back pain without sciatica  Acute  bilateral thoracic back pain     Discharge Instructions     Please try heat on the area  Please try the exercises  Please follow up if your symptoms fail to improve.     ED Prescriptions    Medication Sig Dispense Auth. Provider   naproxen (NAPROSYN) 500 MG tablet Take 1 tablet (500 mg total) by mouth 2 (two) times daily as needed. 60 tablet Myra Rude, MD     PDMP not reviewed this encounter.   Myra Rude, MD 12/18/19 1755

## 2019-12-18 NOTE — Discharge Instructions (Signed)
Please try heat on the area  Please try the exercises  Please follow up if your symptoms fail to improve.

## 2019-12-18 NOTE — ED Triage Notes (Signed)
Patient reports lower, mid, and upper back pain for "many days". Reports she sometimes feels pain in her ribs as well.

## 2020-03-23 IMAGING — US US OB COMP LESS 14 WK
1 series · 15 of 28 positions shown · non-contrast
Comparison: 07/18/2016

CLINICAL DATA: Assess viability.  Pain.

EXAM:
OBSTETRIC <14 WK ULTRASOUND
TECHNIQUE: Transabdominal ultrasound was performed for evaluation of the
gestation as well as the maternal uterus and adnexal regions.

[Series 1: us ob comp less 14 wk · 31 acquisitions, 15 frames shown]
[im 1/31]
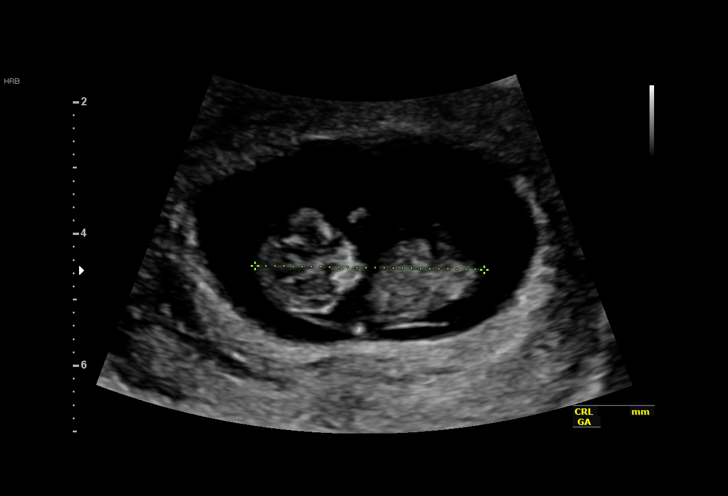
[im 3/31]
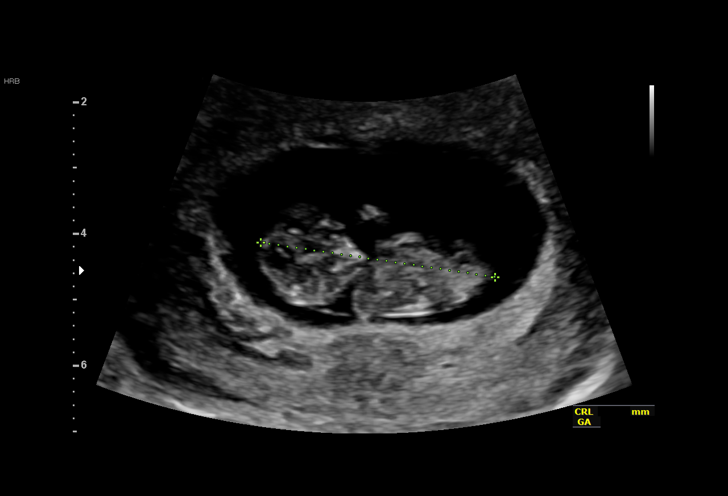
[im 5/31]
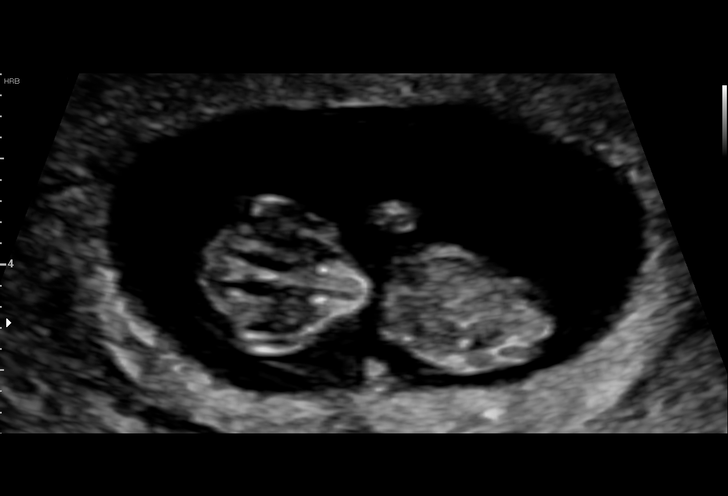
[im 7/31]
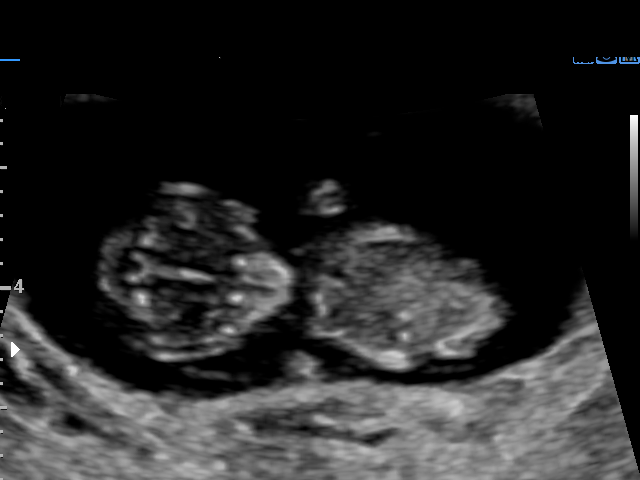
[im 9/31]
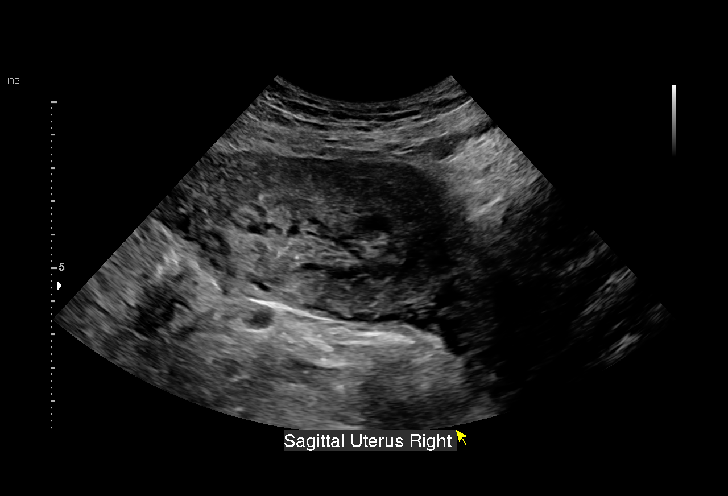
[im 12/31]
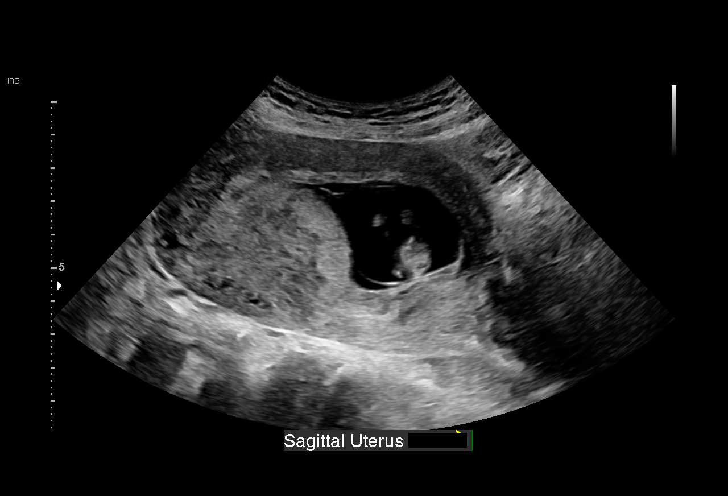
[im 14/31]
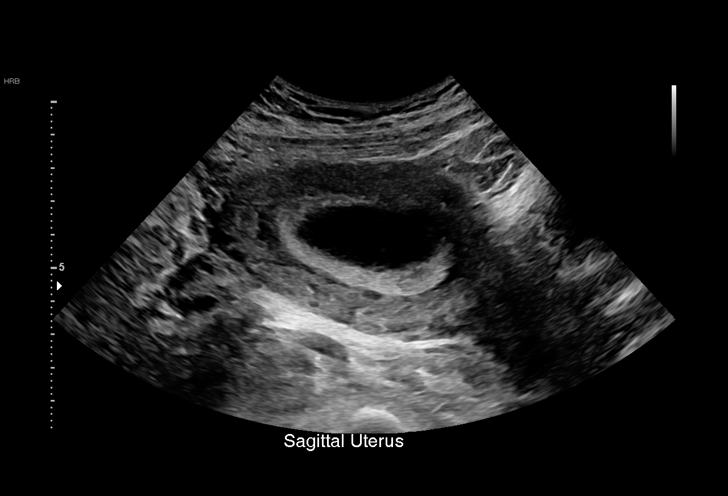
[im 16/31]
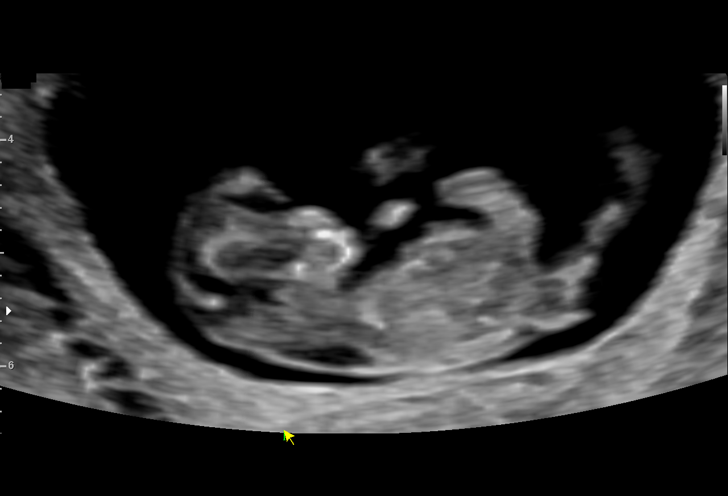
[im 17/31]
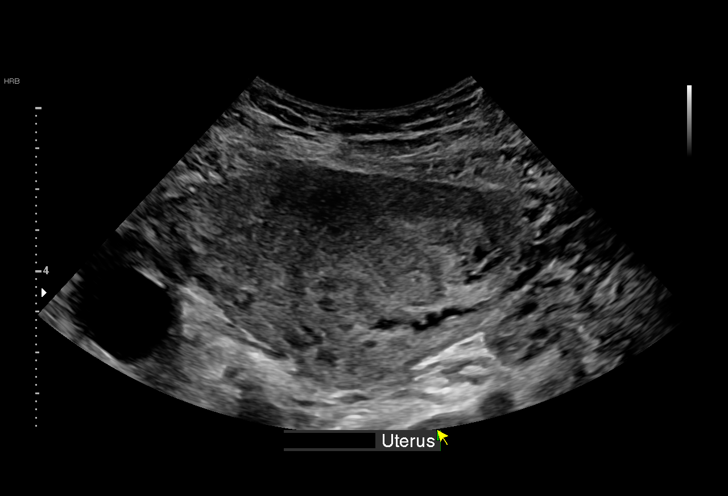
[im 19/31]
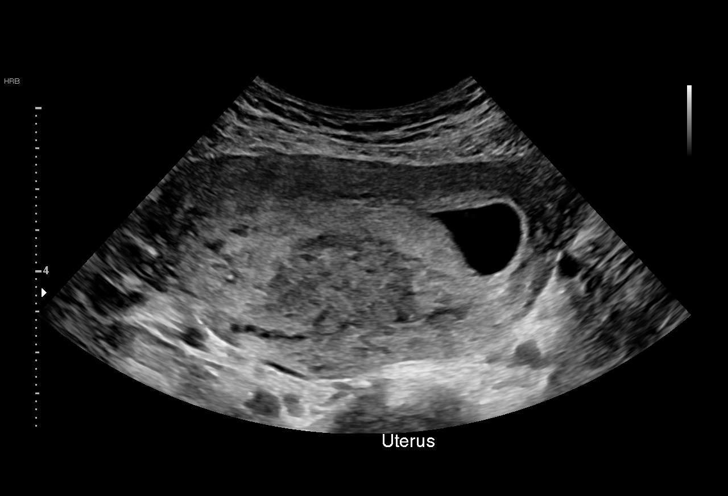
[im 22/31]
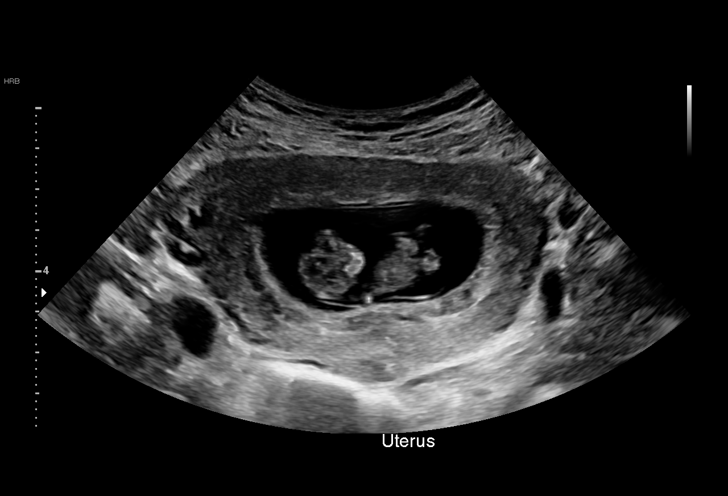
[im 24/31]
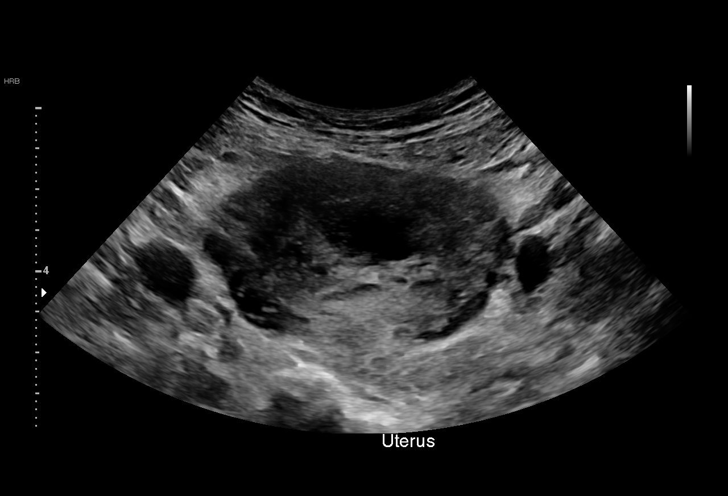
[im 26/31]
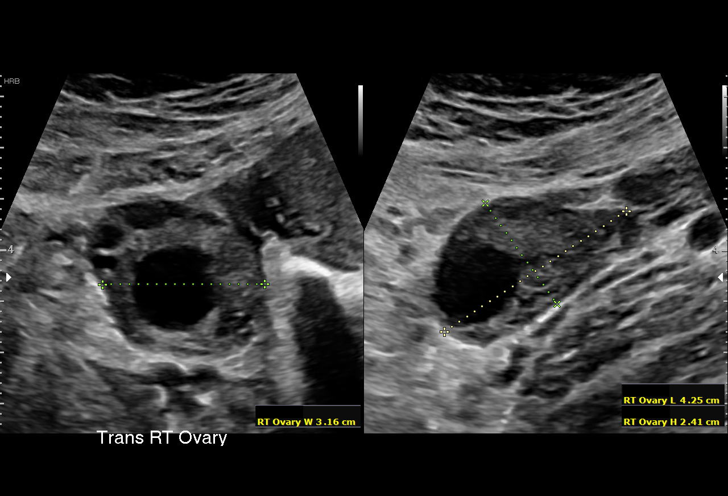
[im 28/31]
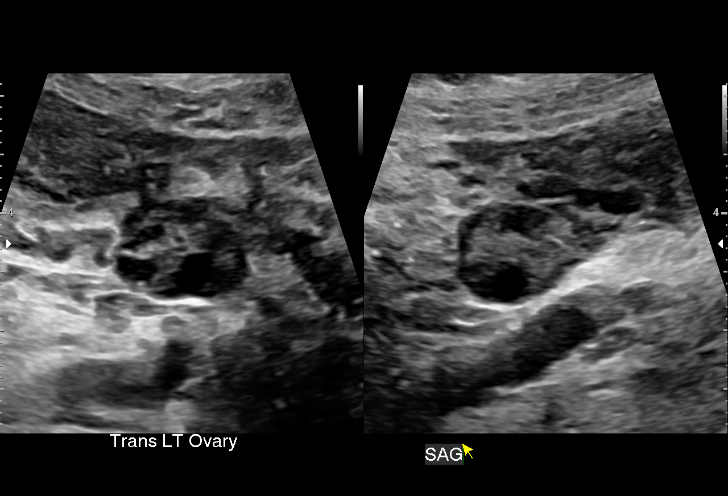
[im 31/31]
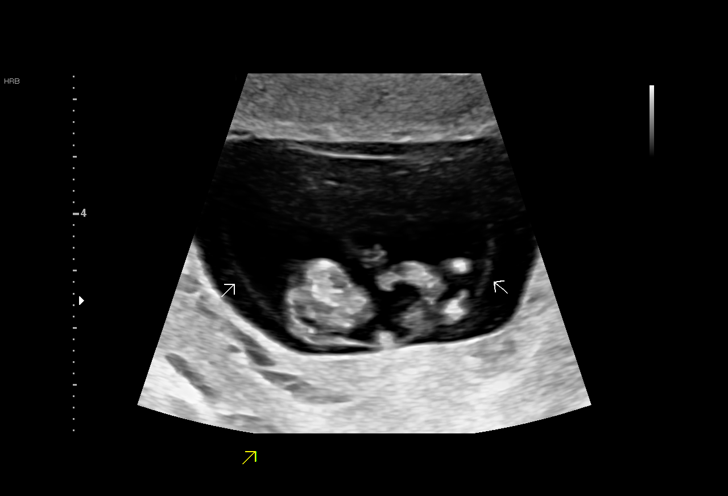

[15 of 28 positions shown; findings below may reference images not displayed]

FINDINGS: Intrauterine gestational sac: Single

Yolk sac:  Visualized.

Embryo:  Visualized.

Cardiac Activity: Visualized.

Heart Rate: 168 bpm

CRL:   35.58 mm   10 w 3 d                  US EDC: 11/20/2018

Subchorionic hemorrhage:  None visualized.

Maternal uterus/adnexae:

Right ovary: Normal

Left ovary: Normal

Other :None

Free fluid:  None
IMPRESSION: 1. Single living intrauterine gestation with an estimated
gestational age of 10 weeks and 3 days. No complications identified.

## 2021-03-30 ENCOUNTER — Ambulatory Visit (INDEPENDENT_AMBULATORY_CARE_PROVIDER_SITE_OTHER): Payer: Medicaid Other | Admitting: Obstetrics and Gynecology

## 2021-03-30 ENCOUNTER — Encounter: Payer: Self-pay | Admitting: Obstetrics and Gynecology

## 2021-03-30 ENCOUNTER — Other Ambulatory Visit: Payer: Self-pay

## 2021-03-30 VITALS — BP 120/83 | HR 73 | Wt 198.0 lb

## 2021-03-30 DIAGNOSIS — Z975 Presence of (intrauterine) contraceptive device: Secondary | ICD-10-CM | POA: Insufficient documentation

## 2021-03-30 DIAGNOSIS — M549 Dorsalgia, unspecified: Secondary | ICD-10-CM | POA: Insufficient documentation

## 2021-03-30 DIAGNOSIS — G8929 Other chronic pain: Secondary | ICD-10-CM

## 2021-03-30 DIAGNOSIS — Z3202 Encounter for pregnancy test, result negative: Secondary | ICD-10-CM | POA: Diagnosis not present

## 2021-03-30 DIAGNOSIS — M545 Low back pain, unspecified: Secondary | ICD-10-CM | POA: Diagnosis not present

## 2021-03-30 DIAGNOSIS — N921 Excessive and frequent menstruation with irregular cycle: Secondary | ICD-10-CM | POA: Insufficient documentation

## 2021-03-30 DIAGNOSIS — Z789 Other specified health status: Secondary | ICD-10-CM

## 2021-03-30 MED ORDER — NORETHINDRONE ACET-ETHINYL EST 1-20 MG-MCG PO TABS: 1.0000 | ORAL_TABLET | Freq: Every day | ORAL | 11 refills | Status: DC

## 2021-03-30 MED ORDER — IBUPROFEN 600 MG PO TABS: 600.0000 mg | ORAL_TABLET | Freq: Four times a day (QID) | ORAL | 1 refills | Status: DC | PRN

## 2021-03-30 NOTE — Progress Notes (Signed)
Patient wants her Nexplanon removed because she stated that it causes her to "bleed for 3 weeks" and have cramps

## 2021-03-30 NOTE — Progress Notes (Signed)
  CC: irregular bleeding Subjective:    Patient ID: Deborah Santana, female    DOB: [birth year], 35 y.o.   MRN: [id number]  HPI 35 yo G6P6 seen for irregular bleeding/spotting secondary to nexplanon.  Pt notes the irregular bleeding has been present the 2 1/2 years she has had the device.  She also notes pelvic pain/discomfort 1 week prior to menses and during menses.  She has never tried any intervention to address the bleeding.    Review of Systems     Objective:   Physical Exam Vitals:   03/30/21 1459  BP: 120/83  Pulse: 73   UPT negative      Assessment & Plan:   1. Breakthrough bleeding on Nexplanon Will pursue trial of OCP x 2 months.  Pt educated on how to take the pills.  If ineffective can consider removal of device.  - POCT urine pregnancy - norethindrone-ethinyl estradiol (LOESTRIN 1/20, 21,) 1-20 MG-MCG tablet; Take 1 tablet by mouth daily.  Dispense: 28 tablet; Refill: 11  2. Language barrier Interpreter present  3. Chronic bilateral low back pain without sciatica NSAID given for chronic low back pain. - ibuprofen (ADVIL) 600 MG tablet; Take 1 tablet (600 mg total) by mouth every 6 (six) hours as needed for headache, mild pain, moderate pain or cramping.  Dispense: 30 tablet; Refill: 1  F/u in 2 months to reassess spotting and irregular bleeding.  Warden Fillers, MD Faculty Attending, Center for [facility]

## 2021-03-31 LAB — POCT PREGNANCY, URINE
Preg Test, Ur: NEGATIVE
Preg Test, Ur: NEGATIVE

## 2021-06-22 ENCOUNTER — Other Ambulatory Visit: Payer: Self-pay

## 2021-06-22 ENCOUNTER — Ambulatory Visit (INDEPENDENT_AMBULATORY_CARE_PROVIDER_SITE_OTHER): Payer: Medicaid Other | Admitting: Obstetrics & Gynecology

## 2021-06-22 ENCOUNTER — Encounter: Payer: Self-pay | Admitting: Obstetrics & Gynecology

## 2021-06-22 VITALS — BP 128/81 | HR 80 | Wt 206.1 lb

## 2021-06-22 DIAGNOSIS — Z975 Presence of (intrauterine) contraceptive device: Secondary | ICD-10-CM

## 2021-06-22 DIAGNOSIS — N921 Excessive and frequent menstruation with irregular cycle: Secondary | ICD-10-CM

## 2021-06-22 NOTE — Progress Notes (Signed)
GYNECOLOGY OFFICE PROCEDURE NOTE  Deborah Santana is a 35 y.o. [id number] here for Nexplanon removal.  Last pap smear was on 2019 and was abnormal and needs f/ul.  She had BTB and is taking OCP and is asking to have device removed    Nexplanon Removal Patient identified, informed consent performed, consent signed.   Appropriate time out taken. Nexplanon site identified.  Area prepped in usual sterile fashon. One ml of 1% lidocaine was used to anesthetize the area at the distal end of the implant. A small stab incision was made right beside the implant on the distal portion.  The Nexplanon rod was grasped  and removed without difficulty.  There was minimal blood loss. There were no complications. A pressure bandage was applied to reduce any bruising.  The patient tolerated the procedure well and was given post procedure instructions.  Patient is planning to use OCP for contraception.Adam Phenix, MD Attending Obstetrician & Gynecologist, [facility] and Center for [facility]  06/22/2021

## 2021-08-21 ENCOUNTER — Ambulatory Visit: Payer: Medicaid Other | Admitting: Family Medicine

## 2021-09-13 NOTE — L&D Delivery Note (Cosign Needed Addendum)
OB/GYN Faculty Practice Delivery Note  Deborah Santana is a 36 y.o. [id number] s/p VD at [redacted]w[redacted]d. She was admitted for spontaneous labor.   ROM: 1h 14m with clear fluid GBS Status:  Negative/-- (08/21 1408) Maximum Maternal Temperature:  Temp (24hrs), Avg:98 F (36.7 C), Min:97 F (36.1 C), Max:98.6 F (37 C)    Labor Progress: Patient arrived at 5 cm dilation and was induced with AROM and pitocin.   Delivery Date/Time: 05/27/2022 at 1158 Delivery: Called to room and patient was complete and pushing. Head delivered in LOA position. No nuchal cord present. 45 second shoulder dystocia which was resolved after McRoberts, Suprapubic pressure, and counterclockwise pressure to the anterior shoulder. Infant with spontaneous cry, placed on mother's abdomen, dried and stimulated. Cord clamped x 2 immediately due to decreased fetal tone, and cut by provider. Cord blood drawn. Placenta delivered spontaneously with gentle cord traction. Fundus firm with massage and Pitocin. Labia, perineum, vagina, and cervix inspected with no lacerations .   Placenta: spontaneous, intact Complications: Shoulder dystocia  Lacerations: None  EBL: 78 Analgesia: None    Infant: APGAR (1 MIN): 7   APGAR (5 MINS): 9   APGAR (10 MINS):    Weight: pending  Derrel Nip, MD  OB Fellow  05/27/2022 1:07 PM

## 2021-12-15 ENCOUNTER — Ambulatory Visit (INDEPENDENT_AMBULATORY_CARE_PROVIDER_SITE_OTHER): Payer: Medicaid Other

## 2021-12-15 VITALS — BP 120/60 | HR 70 | Wt 197.7 lb

## 2021-12-15 DIAGNOSIS — Z3687 Encounter for antenatal screening for uncertain dates: Secondary | ICD-10-CM

## 2021-12-15 DIAGNOSIS — Z3201 Encounter for pregnancy test, result positive: Secondary | ICD-10-CM | POA: Diagnosis not present

## 2021-12-15 LAB — POCT PREGNANCY, URINE: Preg Test, Ur: POSITIVE — AB

## 2021-12-15 NOTE — Progress Notes (Signed)
Possible Pregnancy ? ?Here today for pregnancy confirmation. UPT in office today is positive. Reviewed dating with patient: patient does not know her LMP. Dating [place] scheduled for 12/22/21 at 10:30 AM.  ? ?OB history reviewed. Reviewed medications and allergies with patient; list of medications safe to take during pregnancy given.  Recommended patient begin prenatal vitamin and schedule prenatal care. ? ?Cline Crock, RN ?12/15/2021   ?

## 2021-12-15 NOTE — Patient Instructions (Signed)

## 2021-12-22 ENCOUNTER — Telehealth: Payer: Self-pay

## 2021-12-22 ENCOUNTER — Other Ambulatory Visit: Payer: Self-pay | Admitting: Obstetrics and Gynecology

## 2021-12-22 ENCOUNTER — Ambulatory Visit (INDEPENDENT_AMBULATORY_CARE_PROVIDER_SITE_OTHER): Payer: Medicaid Other

## 2021-12-22 DIAGNOSIS — Z3687 Encounter for antenatal screening for uncertain dates: Secondary | ICD-10-CM

## 2021-12-22 DIAGNOSIS — Z3490 Encounter for supervision of normal pregnancy, unspecified, unspecified trimester: Secondary | ICD-10-CM

## 2021-12-31 ENCOUNTER — Ambulatory Visit (INDEPENDENT_AMBULATORY_CARE_PROVIDER_SITE_OTHER): Payer: Medicaid Other

## 2021-12-31 DIAGNOSIS — O09522 Supervision of elderly multigravida, second trimester: Secondary | ICD-10-CM

## 2021-12-31 DIAGNOSIS — Z3480 Encounter for supervision of other normal pregnancy, unspecified trimester: Secondary | ICD-10-CM | POA: Diagnosis not present

## 2021-12-31 DIAGNOSIS — Z31430 Encounter of female for testing for genetic disease carrier status for procreative management: Secondary | ICD-10-CM | POA: Diagnosis not present

## 2021-12-31 DIAGNOSIS — Z3482 Encounter for supervision of other normal pregnancy, second trimester: Secondary | ICD-10-CM | POA: Diagnosis not present

## 2021-12-31 NOTE — Progress Notes (Signed)
New OB Intake ? ?I connected with  Herschell Dimes on 12/31/21 at 11:15 AM EDT by In Person Visit and verified that I am speaking with the correct person using two identifiers. Nurse is located at [facility] and pt is located at [employer]. ? ?I discussed the limitations, risks, security and privacy concerns of performing an evaluation and management service by telephone and the availability of in person appointments. I also discussed with the patient that there may be a patient responsible charge related to this service. The patient expressed understanding and agreed to proceed. ? ?I explained I am completing New OB Intake today. We discussed her EDD of 05/28/22 that is based on U/S on 12/22/21. Pt is G7/P6. I reviewed her allergies, medications, Medical/Surgical/OB history, and appropriate screenings. I informed her of [facility] services. Based on history, this is a/an  pregnancy uncomplicated .  ? ?Patient Active Problem List  ? Diagnosis Date Noted  ? Supervision of other normal pregnancy, antepartum 12/31/2021  ? AMA (advanced maternal age) multigravida 35+, second trimester 12/31/2021  ? Breakthrough bleeding on Nexplanon 03/30/2021  ? Back pain 03/30/2021  ? ASCUS with positive high risk HPV cervical 08/31/2016  ? Language barrier 08/27/2016  ? ? ?Concerns addressed today ? ?Delivery Plans:  ?Plans to deliver at [facility].  ? ?MyChart/Babyscripts ?MyChart access verified. I explained pt will have some visits in office and some virtually. Babyscripts instructions given and order placed. Patient verifies receipt of registration text/e-mail. Account successfully created and app downloaded. ? ?Blood Pressure Cuff  ?Blood pressure cuff ordered for patient to pick-up from [employer]. Explained after first prenatal appt pt will check weekly and document in Babyscripts. ? ?Weight scale: Patient does / does not  have weight scale. Weight scale ordered for patient to pick up from [employer].  ? ?Anatomy US ?Explained first  scheduled [place] will be around 19 weeks. Anatomy US scheduled for 01/13/22 at 0800. Pt notified to arrive at 0745. ?Scheduled AFP lab only appointment if CenteringPregnancy pt for same day as anatomy US.  ? ?Labs ?Discussed Avelina Laine genetic screening with patient. Would like both Panorama and Horizon drawn at new OB visit.Also if interested in genetic testing, tell patient she will need AFP 15-21 weeks to complete genetic testing .Routine prenatal labs needed. ? ?Covid Vaccine ?Patient has not covid vaccine.  ? ?Is patient a CenteringPregnancy candidate? Not a candidate due to Swahili   "Centering Patient" indicated on sticky note ?  ?Is patient a Mom+Baby Combined Care candidate? Not a candidate   Scheduled with Mom+Baby provider  ?  ?Is patient interested in [place]? No  "Interested in [employer] - Schedule next visit with CNM" on sticky note ? ?Informed patient of [facility] website  and placed link in her AVS.  ? ?Social Determinants of Health ?Food Insecurity: Patient denies food insecurity. ?WIC Referral: Patient is interested in referral to [facility].  ?Transportation: Patient denies transportation needs. ?Childcare: Discussed no children allowed at ultrasound appointments. Offered childcare services; patient declines childcare services at this time. ? ?Send link to Pregnancy Navigators ? ? ?Placed OB Box on problem list and updated ? ?First visit review ?I reviewed new OB appt with pt. I explained she will have a pelvic exam, ob bloodwork with genetic screening, and PAP smear. Explained pt will be seen by Dr. Mathis Fare at first visit; encounter routed to appropriate provider. Explained that patient will be seen by pregnancy navigator following visit with provider. [facility] information placed in AVS.  ? ?  Henrietta Dine, CMA ?12/31/2021  2:08 PM  ?

## 2021-12-31 NOTE — Patient Instructions (Signed)

## 2022-01-04 LAB — CBC/D/PLT+RPR+RH+ABO+RUBIGG...
Basophils Absolute: 0 10*3/uL (ref 0.0–0.2)
Basos: 1 %
EOS (ABSOLUTE): 0.3 10*3/uL (ref 0.0–0.4)
Eos: 5 %
HCV Ab: NONREACTIVE
HIV Screen 4th Generation wRfx: NONREACTIVE
Hematocrit: 30.8 % — ABNORMAL LOW (ref 34.0–46.6)
Hemoglobin: 10.5 g/dL — ABNORMAL LOW (ref 11.1–15.9)
Hepatitis B Surface Ag: NEGATIVE
Immature Grans (Abs): 0 10*3/uL (ref 0.0–0.1)
Immature Granulocytes: 0 %
Lymphocytes Absolute: 1.9 10*3/uL (ref 0.7–3.1)
Lymphs: 35 %
MCH: 28.1 pg (ref 26.6–33.0)
MCHC: 34.1 g/dL (ref 31.5–35.7)
MCV: 82 fL (ref 79–97)
Monocytes Absolute: 0.5 10*3/uL (ref 0.1–0.9)
Monocytes: 8 %
Neutrophils Absolute: 2.9 10*3/uL (ref 1.4–7.0)
Neutrophils: 51 %
Platelets: 154 10*3/uL (ref 150–450)
RBC: 3.74 x10E6/uL — ABNORMAL LOW (ref 3.77–5.28)
RDW: 14.6 % (ref 11.7–15.4)
RPR Ser Ql: NONREACTIVE
Rh Factor: POSITIVE
Rubella Antibodies, IGG: 13.4 index (ref >0.99)
WBC: 5.6 10*3/uL (ref 3.4–10.8)

## 2022-01-04 LAB — HEMOGLOBIN A1C
Est. average glucose Bld gHb Est-mCnc: 114 mg/dL
Hgb A1c MFr Bld: 5.6 % (ref 4.8–5.6)

## 2022-01-04 LAB — AB SCR+ANTIBODY ID: Antibody Screen: POSITIVE — AB

## 2022-01-04 LAB — HCV INTERPRETATION

## 2022-01-04 NOTE — Telephone Encounter (Signed)
Encounter opened in error

## 2022-01-05 ENCOUNTER — Other Ambulatory Visit: Payer: Self-pay | Admitting: Family Medicine

## 2022-01-05 ENCOUNTER — Encounter: Payer: Self-pay | Admitting: Family Medicine

## 2022-01-05 DIAGNOSIS — O361990 Maternal care for other isoimmunization, unspecified trimester, not applicable or unspecified: Secondary | ICD-10-CM | POA: Insufficient documentation

## 2022-01-05 DIAGNOSIS — O99012 Anemia complicating pregnancy, second trimester: Secondary | ICD-10-CM

## 2022-01-05 MED ORDER — FERROUS SULFATE 325 (65 FE) MG PO TABS: 325.0000 mg | ORAL_TABLET | ORAL | 3 refills | Status: DC

## 2022-01-07 ENCOUNTER — Telehealth: Payer: Self-pay

## 2022-01-07 NOTE — Telephone Encounter (Addendum)
-----   Message from Worthy Rancher, MD sent at 01/05/2022  1:51 PM EDT ----- ?Please call patient and let her know that I have reviewed her lab results. We will review them further at her visit. Her lab work is reassuring. I have sent in iron supplements for her mild anemia. Thanks!  ? ?-Deborah Santana  ? ? ?Called pt with [employer] interpreter ID [phone]; unable to leave VM. Will attempt to contact a second time. ?

## 2022-01-11 NOTE — Telephone Encounter (Signed)
Call placed to pt with [employer] interpreter Mohammed # [phone]. Pt did not answer and VM full. Will notify pt at new OB visit on 01/21/22. ?Judeth Cornfield, RN  ?

## 2022-01-13 ENCOUNTER — Ambulatory Visit: Payer: Medicaid Other | Attending: Obstetrics and Gynecology

## 2022-01-13 ENCOUNTER — Ambulatory Visit: Payer: Medicaid Other

## 2022-01-21 ENCOUNTER — Encounter: Payer: Self-pay | Admitting: Family Medicine

## 2022-01-21 ENCOUNTER — Other Ambulatory Visit (HOSPITAL_COMMUNITY)
Admission: RE | Admit: 2022-01-21 | Discharge: 2022-01-21 | Disposition: A | Discharge status: Home / Self Care | Payer: Medicaid Other | Source: Ambulatory Visit | Attending: Family Medicine | Admitting: Family Medicine

## 2022-01-21 ENCOUNTER — Ambulatory Visit (INDEPENDENT_AMBULATORY_CARE_PROVIDER_SITE_OTHER): Payer: Medicaid Other | Admitting: Family Medicine

## 2022-01-21 VITALS — BP 121/74 | HR 87 | Wt 201.1 lb

## 2022-01-21 DIAGNOSIS — Z3480 Encounter for supervision of other normal pregnancy, unspecified trimester: Secondary | ICD-10-CM | POA: Diagnosis not present

## 2022-01-21 DIAGNOSIS — O361990 Maternal care for other isoimmunization, unspecified trimester, not applicable or unspecified: Secondary | ICD-10-CM

## 2022-01-21 DIAGNOSIS — Z789 Other specified health status: Secondary | ICD-10-CM

## 2022-01-21 DIAGNOSIS — O09522 Supervision of elderly multigravida, second trimester: Secondary | ICD-10-CM

## 2022-01-21 DIAGNOSIS — R87810 Cervical high risk human papillomavirus (HPV) DNA test positive: Secondary | ICD-10-CM

## 2022-01-21 DIAGNOSIS — Z3A21 21 weeks gestation of pregnancy: Secondary | ICD-10-CM

## 2022-01-21 DIAGNOSIS — R87610 Atypical squamous cells of undetermined significance on cytologic smear of cervix (ASC-US): Secondary | ICD-10-CM

## 2022-01-21 MED ORDER — PRENATAL PLUS 27-1 MG PO TABS: 1.0000 | ORAL_TABLET | Freq: Every day | ORAL | 11 refills | Status: DC

## 2022-01-21 NOTE — Progress Notes (Signed)
Pt has not picked up Iron, Rx Prenatal vitamins sent. ?

## 2022-01-22 ENCOUNTER — Encounter: Payer: Self-pay | Admitting: Family Medicine

## 2022-01-22 NOTE — Progress Notes (Signed)
?  ? ?Subjective:  ? ?Deborah Santana is a 36 y.o. G7P6006 at [redacted]w[redacted]d by 17 week [place] being seen today for her first obstetrical visit.  She has had 6 prior normal vaginal deliveries.  She denies any problems with her pregnancy so far.  Patient intends to breastfeed and bottle feed.  She denies abdominal pain, N/V, vaginal bleeding, dysuria, or abnormal vaginal discharge.  Pregnancy history fully reviewed. ? ?HISTORY: ?OB History  ?Gravida Para Term Preterm AB Living  ?7 6 6  0 0 6  ?SAB IAB Ectopic Multiple Live Births  ?0 0 0 0 6  ?  ?# Outcome Date GA Lbr Len/2nd Weight Sex Delivery Anes PTL Lv  ?7 Current           ?6 Term 11/27/18 [redacted]w[redacted]d 09:20 / 00:09 6 lb 15 oz (3.147 kg) F Vag-Spont None  LIV  ?   Name: DIMONIQUE, BOURDEAU  ?   Apgar1: 9  Apgar5: 9  ?5 Term 12/22/16 [redacted]w[redacted]d 03:23 / 00:05 7 lb 3 oz (3.26 kg) F Vag-Spont None  LIV  ?   Birth Comments: WNL  ?   Name: SIRIA, CALANDRO  ?   Apgar1: 7  Apgar5: 8  ?4 Term 2015 [redacted]w[redacted]d  6 lb 2.8 oz (2.8 kg) F Vag-Spont   LIV  ?   Birth Comments: born in [redacted]w[redacted]d, no complications.   ?3 Term 2011 [redacted]w[redacted]d  7 lb 11.5 oz (3.5 kg) M Vag-Spont   LIV  ?   Birth Comments: born in [place],no complications  ?2 Term 2008 [redacted]w[redacted]d  8 lb 6 oz (3.8 kg) F Vag-Spont   LIV  ?   Birth Comments: born in [redacted]w[redacted]d, no complications.  ?1 Term 2006 [redacted]w[redacted]d  8 lb 9.6 oz (3.9 kg) M Vag-Spont   LIV  ?   Birth Comments: no complications, born in [redacted]w[redacted]d  ? ?Last pap smear was on 08/17/2018 with ASCUS and + HPV 16. Repeat due today.  ? ?Past Medical History:  ?Diagnosis Date  ? Medical history non-contributory   ? ?Past Surgical History:  ?Procedure Laterality Date  ? NO PAST SURGERIES    ? ?Family History  ?Problem Relation Age of Onset  ? Diabetes Father   ? ?Social History  ? ?Tobacco Use  ? Smoking status: Never  ? Smokeless tobacco: Never  ?Vaping Use  ? Vaping Use: Never used  ?Substance Use Topics  ? Alcohol use: No  ? Drug use: No  ? ?No Known Allergies ? ?Current Outpatient Medications on File Prior to  Visit  ?Medication Sig Dispense Refill  ? ferrous sulfate 325 (65 FE) MG tablet Take 1 tablet (325 mg total) by mouth every other day. (Patient not taking: Reported on 01/21/2022) 30 tablet 3  ? ?No current facility-administered medications on file prior to visit.  ? ?Exam  ? ?Vitals:  ? 01/21/22 1518  ?BP: 121/74  ?Pulse: 87  ?Weight: 201 lb 1.6 oz (91.2 kg)  ? ?Fetal Heart Rate (bpm): 142 ? ?Pelvic Exam: Perineum: No hemorrhoids, normal perineum  ? Vulva: Normal external genitalia, no lesions  ? Vagina:  Normal mucosa, normal discharge  ? Cervix: No lesions and normal, pap smear done  ?System: General: Well-developed, well-nourished female in no acute distress  ? Skin: Warm and dry  ? Neurologic: Alert and oriented x3, normal mood and affect   ? Extremities: Normal ROM, no LE edema   ? HEENT EOMI, conjunctivae clear, MMM  ? Cardiovascular: Normal rate, warm and well perfused   ?  Respiratory:  Normal work of breathing on room air   ? Abdomen: Soft, nontender, gravid   ? ?  ?Assessment:  ? ?Pregnancy: [id number] ?Patient Active Problem List  ? Diagnosis Date Noted  ? Anti-M isoimmunization affecting pregnancy, 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  ? Supervision of other normal pregnancy, antepartum 12/31/2021  ? AMA (advanced maternal age) multigravida 35+, second trimester 12/31/2021  ? ASCUS with positive high risk HPV cervical 08/31/2016  ? Language barrier 08/27/2016  ? ?  ?Plan:  ? ?1. Supervision of other normal pregnancy, antepartum ?2. [redacted] weeks gestation of pregnancy ?Progressing well. FHT within normal limits. VSS. Reviewed prior prenatal labs with patient and explained results. Urine culture and pap smear obtained. Sent in prenatal vitamin for patient. Will follow up for next OB visit in 4 weeks.  ?- Culture, OB Urine ?- prenatal vitamin w/FE, FA (PRENATAL 1 + 1) 27-1 MG TABS tablet; Take 1 tablet by mouth daily at 12 noon.  Dispense: 30 tablet; Refill: 11 ?- Cytology - PAP( [place]) ? ?3. ASCUS with positive high risk  HPV cervical ?Last pap smear was on 08/17/2018 with ASCUS and + HPV 16. Repeat pap obtained today. Will follow up results.  ? ?4. AMA (advanced maternal age) multigravida 35+, second trimester ?LR NIPS. Increased carrier risk for SMA.  ? ?5. Anti-M isoimmunization affecting pregnancy, antepartum, single or unspecified fetus ?Noted on initial prenatal labs. Not clinically significant.  ? ?6. Language barrier ?In-person Swahili interpreter used for entirety of visit.  ? ?Initial labs previously drawn and reviewed.  ?Continue prenatal vitamins. ?Genetic Screening reviewed - increased carrier risk for SMA, LR NIPS.  ?Ultrasound discussed; fetal anatomic survey: Scheduled. ?Problem list reviewed and updated. ?The nature of [facility] Faculty Practice with multiple MDs and other Advanced Practice Providers was explained to patient; also emphasized that residents, students are part of our team. ?Routine obstetric precautions reviewed. ? ?Return in about 4 weeks (around 02/18/2022) for follow up HR OB visit. ? ?Evalina Field, MD  ? ?

## 2022-01-26 LAB — CYTOLOGY - PAP
Chlamydia: NEGATIVE
Comment: NEGATIVE
Comment: NEGATIVE
Comment: NEGATIVE
Comment: NEGATIVE
Comment: NORMAL
Diagnosis: HIGH — AB
HPV 16: POSITIVE — AB
HPV 18 / 45: NEGATIVE
High risk HPV: POSITIVE — AB
Neisseria Gonorrhea: NEGATIVE

## 2022-01-27 ENCOUNTER — Telehealth: Payer: Self-pay

## 2022-01-27 ENCOUNTER — Encounter: Payer: Self-pay | Admitting: *Deleted

## 2022-01-27 ENCOUNTER — Encounter: Payer: Self-pay | Admitting: Family Medicine

## 2022-01-27 NOTE — Telephone Encounter (Addendum)
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 Recommend colposcopy now and plan for colposcopy postpartum as well to add ECC. Can we please let patient know that she has abnormal cells on her pap smear and get her scheduled for colposcopy? Thanks!  ? ?-Vilma Meckel  ? ?Called pt with [place] # [phone] and informed pt results and explained what a colpo is.  Pt advised that the colpo will be done at her OB appt scheduled on 02/22/22 @ 1535.  Pt verbalized understanding with no further questions.  ? ?Dyllen Menning,RN  ?01/27/22 ?

## 2022-02-15 ENCOUNTER — Ambulatory Visit: Payer: Medicaid Other | Attending: Family Medicine

## 2022-02-15 ENCOUNTER — Ambulatory Visit (HOSPITAL_BASED_OUTPATIENT_CLINIC_OR_DEPARTMENT_OTHER): Payer: Medicaid Other | Admitting: Obstetrics

## 2022-02-15 ENCOUNTER — Encounter: Payer: Self-pay | Admitting: Family Medicine

## 2022-02-15 ENCOUNTER — Ambulatory Visit: Payer: Medicaid Other | Admitting: *Deleted

## 2022-02-15 ENCOUNTER — Other Ambulatory Visit: Payer: Self-pay | Admitting: *Deleted

## 2022-02-15 VITALS — BP 112/60 | HR 93

## 2022-02-15 DIAGNOSIS — O0942 Supervision of pregnancy with grand multiparity, second trimester: Secondary | ICD-10-CM

## 2022-02-15 DIAGNOSIS — Z3A25 25 weeks gestation of pregnancy: Secondary | ICD-10-CM | POA: Insufficient documentation

## 2022-02-15 DIAGNOSIS — O09899 Supervision of other high risk pregnancies, unspecified trimester: Secondary | ICD-10-CM | POA: Diagnosis not present

## 2022-02-15 DIAGNOSIS — Z148 Genetic carrier of other disease: Secondary | ICD-10-CM | POA: Insufficient documentation

## 2022-02-15 DIAGNOSIS — O99212 Obesity complicating pregnancy, second trimester: Secondary | ICD-10-CM | POA: Diagnosis not present

## 2022-02-15 DIAGNOSIS — Z3480 Encounter for supervision of other normal pregnancy, unspecified trimester: Secondary | ICD-10-CM | POA: Diagnosis not present

## 2022-02-15 DIAGNOSIS — O09522 Supervision of elderly multigravida, second trimester: Secondary | ICD-10-CM | POA: Insufficient documentation

## 2022-02-15 DIAGNOSIS — Q270 Congenital absence and hypoplasia of umbilical artery: Secondary | ICD-10-CM | POA: Insufficient documentation

## 2022-02-15 DIAGNOSIS — D563 Thalassemia minor: Secondary | ICD-10-CM

## 2022-02-15 NOTE — Progress Notes (Signed)
MFM Note  Deborah Santana was seen for a detailed fetal anatomy scan due to advanced maternal age and grand multiparity.  She denies any significant past medical history and denies any problems in her current pregnancy.    She had a cell free DNA test earlier in her pregnancy which indicated a low risk for trisomy 41, 39, and 13. A female fetus is predicted.   She was informed that the fetal growth and amniotic fluid level were appropriate for her gestational age.   A two-vessel umbilical cord was noted on today's exam.    The implications and management of a two-vessel umbilical cord was discussed in detail with the patient today.  She was advised regarding the small association of trisomy 61 with a two-vessel cord.  The patient was reassured that based on her negative cell free DNA test and as there were no other anomalies noted on today's exam, that it is highly unlikely that her baby will have trisomy 18.      Due to the small possibility of trisomy 18, the patient was offered and declined an amniocentesis today for definitive diagnosis of fetal aneuploidy.   The small risk of fetal growth issues later in her pregnancy due to the two-vessel cord was also discussed today.    The patient was reassured that the two-vessel cord is most likely a normal variant and that her baby will most likely not be affected by this finding after delivery.    The patient was informed that anomalies may be missed due to technical limitations. If the fetus is in a suboptimal position or maternal habitus is increased, visualization of the fetus in the maternal uterus may be impaired.  Due to advanced maternal age and the two-vessel cord, the patient was offered and declined an amniocentesis for definitive diagnosis of fetal aneuploidy.  Due to the two-vessel cord, the patient was also offered and declined a fetal echocardiogram.  She was comfortable with the fetal cardiac views that we obtained today.    A  follow-up exam was scheduled in 4 weeks.    All conversations were held with the patient today with the help of a Swahili interpreter.  A total of 30 minutes was spent counseling and coordinating the care for this patient.  Greater than 50% of the time was spent in direct face-to-face contact.

## 2022-02-22 ENCOUNTER — Ambulatory Visit (INDEPENDENT_AMBULATORY_CARE_PROVIDER_SITE_OTHER): Payer: Medicaid Other | Admitting: Obstetrics and Gynecology

## 2022-02-22 VITALS — BP 120/71 | HR 92 | Wt 203.9 lb

## 2022-02-22 DIAGNOSIS — O361990 Maternal care for other isoimmunization, unspecified trimester, not applicable or unspecified: Secondary | ICD-10-CM

## 2022-02-22 DIAGNOSIS — R87613 High grade squamous intraepithelial lesion on cytologic smear of cervix (HGSIL): Secondary | ICD-10-CM | POA: Diagnosis not present

## 2022-02-22 DIAGNOSIS — Z789 Other specified health status: Secondary | ICD-10-CM

## 2022-02-22 DIAGNOSIS — Z3A26 26 weeks gestation of pregnancy: Secondary | ICD-10-CM | POA: Diagnosis not present

## 2022-02-22 DIAGNOSIS — Q270 Congenital absence and hypoplasia of umbilical artery: Secondary | ICD-10-CM

## 2022-02-22 DIAGNOSIS — O09522 Supervision of elderly multigravida, second trimester: Secondary | ICD-10-CM

## 2022-02-22 NOTE — Progress Notes (Signed)
    PRENATAL VISIT NOTE  Subjective:  Deborah Santana is a 36 y.o. G7P6006 at [redacted]w[redacted]d being seen today for ongoing prenatal care.  She is currently monitored for the following issues for this high-risk pregnancy and has Language barrier; HSIL (high grade squamous intraepithelial lesion) on Pap smear of cervix; Supervision of other normal pregnancy, antepartum; AMA (advanced maternal age) multigravida 70+, second trimester; Anti-M isoimmunization affecting pregnancy, antepartum; and Two vessel umbilical cord on their problem list.  Patient reports no complaints.  Contractions: Not present. Vag. Bleeding: None.  Movement: Present. Denies leaking of fluid.   The following portions of the patient's history were reviewed and updated as appropriate: allergies, current medications, past family history, past medical history, past social history, past surgical history and problem list.   Objective:   Vitals:   02/22/22 1536  BP: 120/71  Pulse: 92  Weight: 203 lb 14.4 oz (92.5 kg)    Fetal Status: Fetal Heart Rate (bpm): 143   Movement: Present     General:  Alert, oriented and cooperative. Patient is in no acute distress.  Skin: Skin is warm and dry. No rash noted.   Cardiovascular: Normal heart rate noted  Respiratory: Normal respiratory effort, no problems with respiration noted  Abdomen: Soft, gravid, appropriate for gestational age.  Pain/Pressure: Absent     Pelvic: Cervical exam deferred        Extremities: Normal range of motion.     Mental Status: Normal mood and affect. Normal behavior. Normal judgment and thought content.   Assessment and Plan:  Pregnancy: G7P6006 at [redacted]w[redacted]d 1. [redacted] weeks gestation of pregnancy GTT next visit  2. Language barrier Interpreter used  3. HSIL (high grade squamous intraepithelial lesion) on Pap smear of cervix Long d/w pt and she was amenable to colpo today. I stressed to her the importance of needing biopsies and repeat colpo 1-18m pp as her paps are  getting worse and could lead to cx cancer  4. Two vessel umbilical cord Fetal echo declined  5. AMA (advanced maternal age) multigravida 64+, second trimester  6. Anti-M isoimmunization affecting pregnancy, antepartum, single or unspecified fetus  Interpreter used  Preterm labor symptoms and general obstetric precautions including but not limited to vaginal bleeding, contractions, leaking of fluid and fetal movement were reviewed in detail with the patient. Please refer to After Visit Summary for other counseling recommendations.   Return in about 1 week (around 03/01/2022) for 1-2wks in person , high risk ob, fasting 2hr GTT, md visit.  Future Appointments  Date Time Provider [place]  03/11/2022  8:50 AM [facility] LAB [facility]  03/11/2022  9:35 AM Aletha Halim, MD [facility]  03/15/2022  1:15 PM [facility] NURSE [facility]  03/15/2022  1:30 PM [facility] US3 [facility]    Aletha Halim, MD

## 2022-02-26 ENCOUNTER — Encounter: Payer: Self-pay | Admitting: Obstetrics and Gynecology

## 2022-02-26 HISTORY — PX: COLPOSCOPY: PRO47

## 2022-02-26 NOTE — Procedures (Signed)
Colposcopy Procedure Note  Pre-operative Diagnosis: 26/3 weeks pregnancy. 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  The patient was placed in the dorsal lithotomy position. A Graves was speculum inserted in the vagina, and the cervix was visualized.  Acetic acid staining was done and the cervix was viewed with green filter; lugol's staining with green filter was also done .  Biopsy or endocervical curettage not done; there was no bleeding after procedure.   Findings: erythematous area at the 10 o'clock position  Adequate: no  Specimens: none  Condition: Stable  Complications: None  Plan: Paps worsening and patient has no showed to all her PP colpos in the past. Importance of coming back 1-2 months PP for a biopsy, as her paps are getting worse and with risk of cervical cancer with progression  Interpreter used   Cornelia Copa MD Attending Center for [employer] [facility])

## 2022-03-08 ENCOUNTER — Other Ambulatory Visit: Payer: Self-pay | Admitting: *Deleted

## 2022-03-08 DIAGNOSIS — Z3480 Encounter for supervision of other normal pregnancy, unspecified trimester: Secondary | ICD-10-CM

## 2022-03-11 ENCOUNTER — Encounter: Payer: Self-pay | Admitting: Obstetrics and Gynecology

## 2022-03-11 ENCOUNTER — Other Ambulatory Visit: Payer: Medicaid Other

## 2022-03-11 ENCOUNTER — Ambulatory Visit (INDEPENDENT_AMBULATORY_CARE_PROVIDER_SITE_OTHER): Payer: Medicaid Other | Admitting: Obstetrics and Gynecology

## 2022-03-11 VITALS — BP 120/64 | HR 84 | Wt 200.4 lb

## 2022-03-11 DIAGNOSIS — O361990 Maternal care for other isoimmunization, unspecified trimester, not applicable or unspecified: Secondary | ICD-10-CM

## 2022-03-11 DIAGNOSIS — Z3480 Encounter for supervision of other normal pregnancy, unspecified trimester: Secondary | ICD-10-CM | POA: Diagnosis not present

## 2022-03-11 DIAGNOSIS — O09899 Supervision of other high risk pregnancies, unspecified trimester: Secondary | ICD-10-CM

## 2022-03-11 DIAGNOSIS — Z23 Encounter for immunization: Secondary | ICD-10-CM

## 2022-03-11 DIAGNOSIS — G129 Spinal muscular atrophy, unspecified: Secondary | ICD-10-CM | POA: Insufficient documentation

## 2022-03-11 DIAGNOSIS — O09522 Supervision of elderly multigravida, second trimester: Secondary | ICD-10-CM

## 2022-03-11 DIAGNOSIS — Z91199 Patient's noncompliance with other medical treatment and regimen due to unspecified reason: Secondary | ICD-10-CM | POA: Insufficient documentation

## 2022-03-11 DIAGNOSIS — Z5941 Food insecurity: Secondary | ICD-10-CM

## 2022-03-11 DIAGNOSIS — Z789 Other specified health status: Secondary | ICD-10-CM

## 2022-03-11 DIAGNOSIS — R87613 High grade squamous intraepithelial lesion on cytologic smear of cervix (HGSIL): Secondary | ICD-10-CM

## 2022-03-11 NOTE — Progress Notes (Signed)
   PRENATAL VISIT NOTE  Subjective:  Deborah Santana is a 36 y.o. G7P6006 at [redacted]w[redacted]d being seen today for ongoing prenatal care.  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  Patient reports no complaints.  Contractions: Not present. Vag. Bleeding: None.  Movement: Present. Denies leaking of fluid.   The following portions of the patient's history were reviewed and updated as appropriate: allergies, current medications, past family history, past medical history, past social history, past surgical history and problem list.   Objective:   Vitals:   03/11/22 0932  BP: 120/64  Pulse: 84  Weight: 200 lb 6.4 oz (90.9 kg)    Fetal Status: Fetal Heart Rate (bpm): 145   Movement: Present     General:  Alert, oriented and cooperative. Patient is in no acute distress.  Skin: Skin is warm and dry. No rash noted.   Cardiovascular: Normal heart rate noted  Respiratory: Normal respiratory effort, no problems with respiration noted  Abdomen: Soft, gravid, appropriate for gestational age.  Pain/Pressure: Absent     Pelvic: Cervical exam deferred        Extremities: Normal range of motion.  Edema: None  Mental Status: Normal mood and affect. Normal behavior. Normal judgment and thought content.   Assessment and Plan:  Pregnancy: G7P6006 at [redacted]w[redacted]d 1. Food insecurity - AMBULATORY REFERRAL TO [facility]  2. Supervision of other normal pregnancy, antepartum 28wk labs today. Ask about birth control next visit - Tdap vaccine greater than  or equal to 7yo IM  3. 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 F/u qmonth growth u/s with mfm and any recs for antenatal testing in the third trimester F/u 7/3 growth u/s 6/5: 41%, ac 34%, afi wnl  4. AMA (advanced maternal age) multigravida 35+, second trimester Low risk panorama  5. Anti-M isoimmunization affecting pregnancy, antepartum, single or unspecified fetus F/u antibody screen today  6. HSIL (high grade squamous intraepithelial lesion) on Pap smear of cervix D/w pt again today nothing to do now but will need one month PP assessment. Please stress this at every visit  7. Language barrier Interpreter used  Preterm labor symptoms and general obstetric precautions including but not limited to vaginal bleeding, contractions, leaking of fluid and fetal movement were reviewed in detail with the patient. Please refer to After Visit Summary for other counseling recommendations.   Return in about 2 weeks (around 03/25/2022) for in person.  Future Appointments  Date Time Provider Department Center  03/15/2022  1:15 PM [facility] NURSE [facility]  03/15/2022  1:30 PM [facility] US3 [facility]    Haakon Bing, MD

## 2022-03-12 LAB — CBC
Hematocrit: 28.8 % — ABNORMAL LOW (ref 34.0–46.6)
Hemoglobin: 9.5 g/dL — ABNORMAL LOW (ref 11.1–15.9)
MCH: 26.8 pg (ref 26.6–33.0)
MCHC: 33 g/dL (ref 31.5–35.7)
MCV: 81 fL (ref 79–97)
Platelets: 152 10*3/uL (ref 150–450)
RBC: 3.55 x10E6/uL — ABNORMAL LOW (ref 3.77–5.28)
RDW: 12.6 % (ref 11.7–15.4)
WBC: 6.4 10*3/uL (ref 3.4–10.8)

## 2022-03-12 LAB — GLUCOSE TOLERANCE, 2 HOURS W/ 1HR
Glucose, 1 hour: 126 mg/dL (ref 70–179)
Glucose, 2 hour: 107 mg/dL (ref 70–152)
Glucose, Fasting: 67 mg/dL — ABNORMAL LOW (ref 70–91)

## 2022-03-12 LAB — HIV ANTIBODY (ROUTINE TESTING W REFLEX): HIV Screen 4th Generation wRfx: NONREACTIVE

## 2022-03-12 LAB — RPR: RPR Ser Ql: NONREACTIVE

## 2022-03-13 ENCOUNTER — Encounter: Payer: Self-pay | Admitting: Obstetrics and Gynecology

## 2022-03-15 ENCOUNTER — Ambulatory Visit: Payer: Medicaid Other | Attending: Obstetrics

## 2022-03-15 ENCOUNTER — Ambulatory Visit: Payer: Medicaid Other

## 2022-03-15 LAB — AB SCR+ANTIBODY ID: Antibody Screen: POSITIVE — AB

## 2022-03-15 LAB — ANTIBODY SCREEN

## 2022-03-15 NOTE — Addendum Note (Signed)
Addended by: Jill Side on: 03/15/2022 04:51 PM   Modules accepted: Orders

## 2022-04-01 ENCOUNTER — Other Ambulatory Visit: Payer: Self-pay

## 2022-04-01 ENCOUNTER — Ambulatory Visit (INDEPENDENT_AMBULATORY_CARE_PROVIDER_SITE_OTHER): Payer: Medicaid Other | Admitting: Obstetrics and Gynecology

## 2022-04-01 VITALS — BP 127/73 | HR 75 | Wt 201.7 lb

## 2022-04-01 DIAGNOSIS — G129 Spinal muscular atrophy, unspecified: Secondary | ICD-10-CM

## 2022-04-01 DIAGNOSIS — O09299 Supervision of pregnancy with other poor reproductive or obstetric history, unspecified trimester: Secondary | ICD-10-CM | POA: Insufficient documentation

## 2022-04-01 DIAGNOSIS — O09899 Supervision of other high risk pregnancies, unspecified trimester: Secondary | ICD-10-CM

## 2022-04-01 DIAGNOSIS — Z3A31 31 weeks gestation of pregnancy: Secondary | ICD-10-CM

## 2022-04-01 DIAGNOSIS — O361990 Maternal care for other isoimmunization, unspecified trimester, not applicable or unspecified: Secondary | ICD-10-CM

## 2022-04-01 DIAGNOSIS — R87613 High grade squamous intraepithelial lesion on cytologic smear of cervix (HGSIL): Secondary | ICD-10-CM

## 2022-04-01 DIAGNOSIS — Z789 Other specified health status: Secondary | ICD-10-CM

## 2022-04-01 DIAGNOSIS — O09522 Supervision of elderly multigravida, second trimester: Secondary | ICD-10-CM

## 2022-04-01 DIAGNOSIS — Z3480 Encounter for supervision of other normal pregnancy, unspecified trimester: Secondary | ICD-10-CM

## 2022-04-01 MED ORDER — ASPIRIN 81 MG PO CHEW: 81.0000 mg | CHEWABLE_TABLET | Freq: Every day | ORAL | 4 refills | Status: DC

## 2022-04-01 NOTE — Progress Notes (Signed)
   PRENATAL VISIT NOTE  Subjective:  Deborah Santana is a 36 y.o. G7P6006 at [redacted]w[redacted]d being seen today for ongoing prenatal care.  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  Patient doing well with no acute concerns today. She reports no complaints.  Contractions: Not present. Vag. Bleeding: None.  Movement: Present. Denies leaking of fluid.   The following portions of the patient's history were reviewed and updated as appropriate: allergies, current medications, past family history, past medical history, past social history, past surgical history and problem list. Problem list updated.  Objective:   Vitals:   04/01/22 0946  BP: 127/73  Pulse: 75  Weight: 201 lb 11.2 oz (91.5 kg)    Fetal Status: Fetal Heart Rate (bpm): 138 Fundal Height: 32 cm Movement: Present     General:  Alert, oriented and cooperative. Patient is in no acute distress.  Skin: Skin is warm and dry. No rash noted.   Cardiovascular: Normal heart rate noted  Respiratory: Normal respiratory effort, no problems with respiration noted  Abdomen: Soft, gravid, appropriate for gestational age.  Pain/Pressure: Absent     Pelvic: Cervical exam deferred        Extremities: Normal range of motion.  Edema: Trace  Mental Status:  Normal mood and affect. Normal behavior. Normal judgment and thought content.   Assessment and Plan:  Pregnancy:  G7P6006 at [redacted]w[redacted]d  1. [redacted] weeks gestation of pregnancy   2. 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 Language barrier Live interpreter present  5. HSIL (high grade squamous intraepithelial lesion) on Pap smear of cervix Discussed need for colpo after delivery as the dysplasia can progress to cancer  6. Supervision of other normal pregnancy, antepartum Continue routine prenatal care  7. AMA (advanced maternal age) multigravida 35+, second trimester Rx for baby ASA placed due to risk factors  8. Anti-M isoimmunization affecting pregnancy, antepartum, single or unspecified fetus Antibody below lab threshold  9. Hx of shoulder dystocia in prior pregnancy, currently pregnant Prepare at time of delivery  Preterm labor symptoms and general obstetric precautions including but not limited to vaginal bleeding, contractions, leaking of fluid and fetal movement were reviewed in detail with the patient.  Please refer to After Visit Summary for other counseling recommendations.   Return in about 2 weeks (around 04/15/2022) for ROB, in person.   Mariel Aloe, MD Faculty Attending [facility]

## 2022-04-19 ENCOUNTER — Other Ambulatory Visit: Payer: Self-pay

## 2022-04-19 ENCOUNTER — Ambulatory Visit (INDEPENDENT_AMBULATORY_CARE_PROVIDER_SITE_OTHER): Payer: Medicaid Other | Admitting: Obstetrics and Gynecology

## 2022-04-19 VITALS — BP 120/72 | HR 86 | Wt 202.0 lb

## 2022-04-19 DIAGNOSIS — Z3480 Encounter for supervision of other normal pregnancy, unspecified trimester: Secondary | ICD-10-CM

## 2022-04-19 DIAGNOSIS — O09299 Supervision of pregnancy with other poor reproductive or obstetric history, unspecified trimester: Secondary | ICD-10-CM

## 2022-04-19 DIAGNOSIS — Z789 Other specified health status: Secondary | ICD-10-CM

## 2022-04-19 DIAGNOSIS — R87613 High grade squamous intraepithelial lesion on cytologic smear of cervix (HGSIL): Secondary | ICD-10-CM

## 2022-04-19 DIAGNOSIS — O09899 Supervision of other high risk pregnancies, unspecified trimester: Secondary | ICD-10-CM

## 2022-04-19 DIAGNOSIS — Z3483 Encounter for supervision of other normal pregnancy, third trimester: Secondary | ICD-10-CM

## 2022-04-19 DIAGNOSIS — O09523 Supervision of elderly multigravida, third trimester: Secondary | ICD-10-CM

## 2022-04-19 DIAGNOSIS — O99019 Anemia complicating pregnancy, unspecified trimester: Secondary | ICD-10-CM

## 2022-04-19 DIAGNOSIS — Z3A34 34 weeks gestation of pregnancy: Secondary | ICD-10-CM

## 2022-04-19 DIAGNOSIS — O09522 Supervision of elderly multigravida, second trimester: Secondary | ICD-10-CM

## 2022-04-19 DIAGNOSIS — O09893 Supervision of other high risk pregnancies, third trimester: Secondary | ICD-10-CM

## 2022-04-19 DIAGNOSIS — O99013 Anemia complicating pregnancy, third trimester: Secondary | ICD-10-CM

## 2022-04-19 NOTE — Progress Notes (Signed)
   PRENATAL VISIT NOTE  Subjective:  Deborah Santana is a 36 y.o. G7P6006 at [redacted]w[redacted]d being seen today for ongoing prenatal care.  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  Patient reports no complaints except pain in back and hops with working and long periods of standing.  Contractions: Not present. Vag. Bleeding: None.  Movement: Present. Denies leaking of fluid.   The following portions of the patient's history were reviewed and updated as appropriate: allergies, current medications, past family history, past medical history, past social history, past surgical history and problem list.   Objective:   Vitals:   04/19/22 1345  BP: 120/72  Pulse: 86  Weight: 202 lb (91.6 kg)    Fetal Status: Fetal Heart Rate (bpm): 145   Movement: Present     General:  Alert, oriented and cooperative. Patient is in no acute distress.  Skin: Skin is warm and dry. No rash noted.   Cardiovascular: Normal heart rate noted  Respiratory: Normal respiratory effort, no problems with respiration noted  Abdomen: Soft, gravid, appropriate for gestational age.  Pain/Pressure: Absent     Pelvic: Cervical exam deferred        Extremities: Normal range of motion.  Edema: Trace  Mental Status: Normal mood and affect. Normal behavior. Normal judgment and thought content.   Assessment and Plan:  Pregnancy: G7P6006 at [redacted]w[redacted]d 1.  Language barrier Interpreter used throughout appt  2. 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   3. HSIL (high grade squamous intraepithelial lesion) on Pap smear of cervix Reiterated PP Colpo importance  4. Antepartum anemia She is not taking her Iron - she did not know she had a prescription. She will go now. Reviewed importance of it to prevent need for blood transfusion and IV iron.  Last HgB 1 month ago was 9.5 and MCV was wnl at 81  5. Supervision of other normal pregnancy, antepartum Continue routine prenatal care She is not taking her ldASA.   6. AMA (advanced maternal age) multigravida 35+, second trimester  7. Hx of shoulder dystocia in prior pregnancy, currently pregnant As noted will be aware for at delivery  Preterm labor symptoms and general obstetric precautions including but not limited to vaginal bleeding, contractions, leaking of fluid and fetal movement were reviewed in detail with the patient. Please refer to After Visit Summary for other counseling recommendations.   No follow-ups on file.  Future Appointments  Date Time Provider Department Center  04/26/2022  1:30 PM [facility] NURSE [facility]  04/26/2022  1:45 PM [facility] US5 [facility]    Milas Hock, MD

## 2022-04-21 LAB — ANEMIA PROFILE B

## 2022-04-21 LAB — SPECIMEN STATUS REPORT

## 2022-04-26 ENCOUNTER — Encounter: Payer: Self-pay | Admitting: *Deleted

## 2022-04-26 ENCOUNTER — Other Ambulatory Visit: Payer: Self-pay | Admitting: *Deleted

## 2022-04-26 ENCOUNTER — Ambulatory Visit: Payer: Medicaid Other | Attending: Obstetrics

## 2022-04-26 ENCOUNTER — Ambulatory Visit: Payer: Medicaid Other | Admitting: *Deleted

## 2022-04-26 VITALS — BP 122/66 | HR 84

## 2022-04-26 DIAGNOSIS — Z6836 Body mass index (BMI) 36.0-36.9, adult: Secondary | ICD-10-CM

## 2022-04-26 DIAGNOSIS — D563 Thalassemia minor: Secondary | ICD-10-CM | POA: Diagnosis not present

## 2022-04-26 DIAGNOSIS — O0943 Supervision of pregnancy with grand multiparity, third trimester: Secondary | ICD-10-CM | POA: Insufficient documentation

## 2022-04-26 DIAGNOSIS — Z3480 Encounter for supervision of other normal pregnancy, unspecified trimester: Secondary | ICD-10-CM

## 2022-04-26 DIAGNOSIS — O99013 Anemia complicating pregnancy, third trimester: Secondary | ICD-10-CM | POA: Insufficient documentation

## 2022-04-26 DIAGNOSIS — O99213 Obesity complicating pregnancy, third trimester: Secondary | ICD-10-CM | POA: Diagnosis not present

## 2022-04-26 DIAGNOSIS — O09522 Supervision of elderly multigravida, second trimester: Secondary | ICD-10-CM | POA: Diagnosis not present

## 2022-04-26 DIAGNOSIS — E669 Obesity, unspecified: Secondary | ICD-10-CM | POA: Diagnosis not present

## 2022-04-26 DIAGNOSIS — O285 Abnormal chromosomal and genetic finding on antenatal screening of mother: Secondary | ICD-10-CM | POA: Diagnosis not present

## 2022-04-26 DIAGNOSIS — Z3A35 35 weeks gestation of pregnancy: Secondary | ICD-10-CM | POA: Insufficient documentation

## 2022-04-26 DIAGNOSIS — O09523 Supervision of elderly multigravida, third trimester: Secondary | ICD-10-CM

## 2022-04-26 DIAGNOSIS — O09899 Supervision of other high risk pregnancies, unspecified trimester: Secondary | ICD-10-CM | POA: Insufficient documentation

## 2022-04-26 DIAGNOSIS — O99212 Obesity complicating pregnancy, second trimester: Secondary | ICD-10-CM | POA: Insufficient documentation

## 2022-05-03 ENCOUNTER — Other Ambulatory Visit: Payer: Self-pay

## 2022-05-03 ENCOUNTER — Other Ambulatory Visit (HOSPITAL_COMMUNITY)
Admission: RE | Admit: 2022-05-03 | Discharge: 2022-05-03 | Disposition: A | Discharge status: Home / Self Care | Payer: Medicaid Other | Source: Ambulatory Visit | Attending: Family Medicine | Admitting: Family Medicine

## 2022-05-03 ENCOUNTER — Ambulatory Visit (INDEPENDENT_AMBULATORY_CARE_PROVIDER_SITE_OTHER): Payer: Medicaid Other | Admitting: Family Medicine

## 2022-05-03 VITALS — BP 121/74 | HR 84 | Wt 202.3 lb

## 2022-05-03 DIAGNOSIS — O09299 Supervision of pregnancy with other poor reproductive or obstetric history, unspecified trimester: Secondary | ICD-10-CM

## 2022-05-03 DIAGNOSIS — Z641 Problems related to multiparity: Secondary | ICD-10-CM

## 2022-05-03 DIAGNOSIS — Z3480 Encounter for supervision of other normal pregnancy, unspecified trimester: Secondary | ICD-10-CM | POA: Insufficient documentation

## 2022-05-03 DIAGNOSIS — O09522 Supervision of elderly multigravida, second trimester: Secondary | ICD-10-CM

## 2022-05-03 DIAGNOSIS — Z789 Other specified health status: Secondary | ICD-10-CM

## 2022-05-03 DIAGNOSIS — O09899 Supervision of other high risk pregnancies, unspecified trimester: Secondary | ICD-10-CM

## 2022-05-03 DIAGNOSIS — O361990 Maternal care for other isoimmunization, unspecified trimester, not applicable or unspecified: Secondary | ICD-10-CM

## 2022-05-03 NOTE — Progress Notes (Signed)
   PRENATAL VISIT NOTE  Subjective:  Deborah Santana is a 36 y.o. G7P6006 at [redacted]w[redacted]d being seen today for ongoing prenatal care.  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  Patient reports no complaints.  Contractions: Not present. Vag. Bleeding: None.  Movement: Present. Denies leaking of fluid.   The following portions of the patient's history were reviewed and updated as appropriate: allergies, current medications, past family history, past medical history, past social history, past surgical history and problem list.   Objective:   Vitals:   05/03/22 1352  BP: 121/74  Pulse: 84  Weight: 202 lb 4.8 oz (91.8 kg)    Fetal Status: Fetal Heart Rate (bpm): 131 Fundal Height: 36 cm Movement: Present  Presentation: Vertex  General:  Alert, oriented and cooperative. Patient is in no acute distress.  Skin: Skin is warm and dry. No rash noted.   Cardiovascular: Normal heart rate noted  Respiratory: Normal respiratory effort, no problems with respiration noted  Abdomen: Soft, gravid, appropriate for gestational age.  Pain/Pressure: Absent     Pelvic: Cervical exam performed in the presence of a chaperone Dilation: 1 Effacement (%): 10, 20 Station: Ballotable  Extremities: Normal range of motion.  Edema: Trace  Mental Status: Normal mood and affect. Normal behavior. Normal judgment and thought  content.   Assessment and Plan:  Pregnancy: G7P6006 at 107w3d 1. 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 Language barrier Swahili interpreter: iPad used  3. Anti-M isoimmunization affecting pregnancy, antepartum, single or unspecified fetus   4. Hx of shoulder dystocia in prior pregnancy, currently pregnant Discussed early IOL - patient declines  5. Supervision of other normal pregnancy, antepartum Cultures today - GC/Chlamydia probe amp ([place])not at [facility] - Culture, beta strep (group b only)  6. AMA (advanced maternal age) multigravida 35+, second trimester LR NIPT  7. Grand multipara At risk of PPH  Preterm labor symptoms and general obstetric precautions including but not limited to vaginal bleeding, contractions, leaking of fluid and fetal movement were reviewed in detail with the patient. Please refer to After Visit Summary for other counseling recommendations.   Return in 1 week (on 05/10/2022).  Future Appointments  Date Time Provider Department Center  05/13/2022  8:55 AM Madlyn Frankel [facility]  05/19/2022 10:30 AM [facility] NURSE [facility]  05/19/2022 10:45 AM [facility] US4 [facility]    Reva Bores, MD

## 2022-05-04 LAB — GC/CHLAMYDIA PROBE AMP (~~LOC~~) NOT AT ARMC
Chlamydia: NEGATIVE
Comment: NEGATIVE
Comment: NORMAL
Neisseria Gonorrhea: NEGATIVE

## 2022-05-07 LAB — CULTURE, BETA STREP (GROUP B ONLY): Strep Gp B Culture: NEGATIVE

## 2022-05-09 ENCOUNTER — Telehealth: Payer: Self-pay | Admitting: Radiology

## 2022-05-09 ENCOUNTER — Encounter: Payer: Self-pay | Admitting: Radiology

## 2022-05-09 NOTE — Telephone Encounter (Signed)
error 

## 2022-05-12 NOTE — Progress Notes (Unsigned)
   PRENATAL VISIT NOTE  Subjective:  Deborah Santana is a 36 y.o. G7P6006 at [redacted]w[redacted]d being seen today for ongoing prenatal care.  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  Patient reports {sx:14538}.   .  .   . Denies leaking of fluid.   The following portions of the patient's history were reviewed and updated as appropriate: allergies, current medications, past family history, past medical history, past social history, past surgical history and problem list.   Objective:  There were no vitals filed for this visit.  Fetal Status:           General:  Alert, oriented and cooperative. Patient is in no acute distress.  Skin: Skin is warm and dry. No rash noted.   Cardiovascular: Normal heart rate noted  Respiratory: Normal respiratory effort, no problems with respiration noted  Abdomen: Soft, gravid, appropriate for gestational age.        Pelvic: {Blank single:19197::"Cervical exam performed in the presence of a chaperone","Cervical exam deferred"}        Extremities: Normal range of motion.     Mental Status: Normal mood and affect. Normal behavior. Normal judgment and thought content.   Assessment and Plan:  Pregnancy: P5T6144 at [redacted]w[redacted]d There are no diagnoses linked to this encounter. {Blank single:19197::"Term","Preterm"} labor symptoms and general  obstetric precautions including but not limited to vaginal bleeding, contractions, leaking of fluid and fetal movement were reviewed in detail with the patient. Please refer to After Visit Summary for other counseling recommendations.   No follow-ups on file.  Future Appointments  Date Time Provider Department Center  05/13/2022  8:55 AM Madlyn Frankel [facility]  05/19/2022 10:30 AM [facility] NURSE [facility]  05/19/2022 10:45 AM [facility] US4 [facility]    Samara Deist Gena Fray, CNM

## 2022-05-13 ENCOUNTER — Ambulatory Visit (INDEPENDENT_AMBULATORY_CARE_PROVIDER_SITE_OTHER): Payer: Medicaid Other | Admitting: Student

## 2022-05-13 ENCOUNTER — Other Ambulatory Visit: Payer: Self-pay

## 2022-05-13 VITALS — BP 127/68 | HR 82 | Wt 199.8 lb

## 2022-05-13 DIAGNOSIS — O09522 Supervision of elderly multigravida, second trimester: Secondary | ICD-10-CM

## 2022-05-13 DIAGNOSIS — Z641 Problems related to multiparity: Secondary | ICD-10-CM

## 2022-05-13 DIAGNOSIS — Z3A37 37 weeks gestation of pregnancy: Secondary | ICD-10-CM

## 2022-05-13 DIAGNOSIS — Z3480 Encounter for supervision of other normal pregnancy, unspecified trimester: Secondary | ICD-10-CM

## 2022-05-18 NOTE — Progress Notes (Signed)
   PRENATAL VISIT NOTE  Subjective:  Deborah Santana is a 36 y.o. G7P6006 at [redacted]w[redacted]d being seen today for ongoing prenatal care.  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  Patient reports no complaints.  Contractions: Not present. Vag. Bleeding: None.  Movement: Present. Denies leaking of fluid.   The following portions of the patient's history were reviewed and updated as appropriate: allergies, current medications, past family history, past medical history, past social history, past surgical history and problem list.   Objective:   Vitals:   05/20/22 1536  BP: 118/65  Pulse: 91  Weight: 205 lb 9.6 oz (93.3 kg)    Fetal Status: Fetal Heart Rate (bpm): 139   Movement: Present     General:  Alert, oriented and cooperative. Patient is in no acute distress.  Skin: Skin is warm and dry. No rash noted.   Cardiovascular: Normal heart rate noted  Respiratory: Normal respiratory effort, no problems with respiration noted  Abdomen: Soft, gravid, appropriate for gestational age.  Pain/Pressure: Absent     Pelvic: Cervical exam deferred        Extremities: Normal range of motion.  Edema: Trace  Mental Status: Normal mood and affect. Normal behavior. Normal judgment and thought content.   Assessment and Plan:  Pregnancy: G7P6006 at [redacted]w[redacted]d 1. [redacted] weeks gestation of pregnancy   2. Supervision of  other normal pregnancy, antepartum   -patient declines IOL, cervix check today. Reviewed MFM note that says IOL was recommended at 39 weeks and patient declined; she declines today as well   Preterm labor symptoms and general obstetric precautions including but not limited to vaginal bleeding, contractions, leaking of fluid and fetal movement were reviewed in detail with the patient. Please refer to After Visit Summary for other counseling recommendations.   No follow-ups on file.  Future Appointments  Date Time Provider Department Center  05/26/2022  3:35 PM Milas Hock, MD [facility]    Charlesetta Garibaldi [place], [place]

## 2022-05-19 ENCOUNTER — Ambulatory Visit: Payer: Medicaid Other | Admitting: *Deleted

## 2022-05-19 ENCOUNTER — Ambulatory Visit: Payer: Medicaid Other | Attending: Obstetrics

## 2022-05-19 VITALS — BP 129/76 | HR 88

## 2022-05-19 DIAGNOSIS — Z3689 Encounter for other specified antenatal screening: Secondary | ICD-10-CM

## 2022-05-19 DIAGNOSIS — O09523 Supervision of elderly multigravida, third trimester: Secondary | ICD-10-CM | POA: Insufficient documentation

## 2022-05-19 DIAGNOSIS — Z6836 Body mass index (BMI) 36.0-36.9, adult: Secondary | ICD-10-CM | POA: Diagnosis not present

## 2022-05-19 DIAGNOSIS — O09899 Supervision of other high risk pregnancies, unspecified trimester: Secondary | ICD-10-CM | POA: Insufficient documentation

## 2022-05-20 ENCOUNTER — Ambulatory Visit (INDEPENDENT_AMBULATORY_CARE_PROVIDER_SITE_OTHER): Payer: Medicaid Other | Admitting: Student

## 2022-05-20 ENCOUNTER — Other Ambulatory Visit: Payer: Self-pay

## 2022-05-20 VITALS — BP 118/65 | HR 91 | Wt 205.6 lb

## 2022-05-20 DIAGNOSIS — Z3480 Encounter for supervision of other normal pregnancy, unspecified trimester: Secondary | ICD-10-CM

## 2022-05-20 DIAGNOSIS — Z3483 Encounter for supervision of other normal pregnancy, third trimester: Secondary | ICD-10-CM

## 2022-05-20 DIAGNOSIS — Z3A38 38 weeks gestation of pregnancy: Secondary | ICD-10-CM

## 2022-05-23 NOTE — Progress Notes (Unsigned)
   PRENATAL VISIT NOTE  Subjective:  Deborah Santana is a 36 y.o. G7P6006 at [redacted]w[redacted]d being seen today for ongoing prenatal care.  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  Patient reports no complaints.  Contractions: Irregular. Vag. Bleeding: None.  Movement: Present. Denies leaking of fluid.   The following portions of the patient's history were reviewed and updated as appropriate: allergies, current medications, past family history, past medical history, past social history, past surgical history and problem list.   Objective:   Vitals:   05/26/22 1547  BP: 133/68  Pulse: 93  Weight: 203 lb 12.8 oz (92.4 kg)    Fetal Status: Fetal Heart Rate (bpm): 145 Fundal Height: 40 cm Movement: Present  Presentation: Vertex  General:  Alert, oriented and cooperative. Patient is in no acute distress.  Skin: Skin is warm and dry. No rash noted.   Cardiovascular: Normal heart rate noted  Respiratory: Normal respiratory effort, no problems with respiration noted  Abdomen: Soft, gravid, appropriate for gestational age.  Pain/Pressure: Present     Pelvic: Cervical exam deferred Dilation: 3 Effacement (%): 50 Station: -3  Extremities: Normal range of motion.  Edema: Trace  Mental Status: Normal mood and affect. Normal behavior. Normal judgment and thought content.   Assessment and Plan:  Pregnancy: G7P6006 at [redacted]w[redacted]d 1. Single umbilical  artery affecting management of mother in singleton pregnancy, antepartum Normal growth on 9/6 - 3497g, 61%ile, normal afi  2. Language barrier [place] in person interpreter   3. Antepartum anemia  4. Supervision of other normal pregnancy, antepartum GBS neg NST at next appt  5. AMA (advanced maternal age) multigravida 35+, second trimester LR nips  6. Hx of shoulder dystocia in prior pregnancy, currently pregnant Declines early IOL  Term labor symptoms and general obstetric precautions including but not limited to vaginal bleeding, contractions, leaking of fluid and fetal movement were reviewed in detail with the patient. Please refer to After Visit Summary for other counseling recommendations.   Return in about 1 week (around 06/02/2022) for OB VISIT, MD or APP, Needs NST .  No future appointments.   Milas Hock, MD

## 2022-05-26 ENCOUNTER — Other Ambulatory Visit: Payer: Self-pay

## 2022-05-26 ENCOUNTER — Ambulatory Visit (INDEPENDENT_AMBULATORY_CARE_PROVIDER_SITE_OTHER): Payer: Medicaid Other | Admitting: Obstetrics and Gynecology

## 2022-05-26 VITALS — BP 133/68 | HR 93 | Wt 203.8 lb

## 2022-05-26 DIAGNOSIS — O09522 Supervision of elderly multigravida, second trimester: Secondary | ICD-10-CM

## 2022-05-26 DIAGNOSIS — Z3480 Encounter for supervision of other normal pregnancy, unspecified trimester: Secondary | ICD-10-CM

## 2022-05-26 DIAGNOSIS — O09299 Supervision of pregnancy with other poor reproductive or obstetric history, unspecified trimester: Secondary | ICD-10-CM

## 2022-05-26 DIAGNOSIS — Z789 Other specified health status: Secondary | ICD-10-CM

## 2022-05-26 DIAGNOSIS — O99019 Anemia complicating pregnancy, unspecified trimester: Secondary | ICD-10-CM

## 2022-05-26 DIAGNOSIS — O09899 Supervision of other high risk pregnancies, unspecified trimester: Secondary | ICD-10-CM

## 2022-05-27 ENCOUNTER — Other Ambulatory Visit: Payer: Self-pay

## 2022-05-27 ENCOUNTER — Encounter (HOSPITAL_COMMUNITY): Payer: Self-pay | Admitting: Obstetrics and Gynecology

## 2022-05-27 ENCOUNTER — Inpatient Hospital Stay (HOSPITAL_COMMUNITY)
Admission: AD | Admit: 2022-05-27 | Discharge: 2022-05-28 | DRG: 807 | Disposition: A | Discharge status: Home / Self Care | Payer: Medicaid Other | Attending: Obstetrics and Gynecology | Admitting: Obstetrics and Gynecology

## 2022-05-27 DIAGNOSIS — G129 Spinal muscular atrophy, unspecified: Secondary | ICD-10-CM | POA: Diagnosis present

## 2022-05-27 DIAGNOSIS — O09899 Supervision of other high risk pregnancies, unspecified trimester: Secondary | ICD-10-CM

## 2022-05-27 DIAGNOSIS — O134 Gestational [pregnancy-induced] hypertension without significant proteinuria, complicating childbirth: Secondary | ICD-10-CM | POA: Diagnosis not present

## 2022-05-27 DIAGNOSIS — Z789 Other specified health status: Secondary | ICD-10-CM | POA: Diagnosis present

## 2022-05-27 DIAGNOSIS — O26893 Other specified pregnancy related conditions, third trimester: Secondary | ICD-10-CM | POA: Diagnosis not present

## 2022-05-27 DIAGNOSIS — Z3A39 39 weeks gestation of pregnancy: Secondary | ICD-10-CM

## 2022-05-27 DIAGNOSIS — O361930 Maternal care for other isoimmunization, third trimester, not applicable or unspecified: Secondary | ICD-10-CM | POA: Diagnosis not present

## 2022-05-27 DIAGNOSIS — O361990 Maternal care for other isoimmunization, unspecified trimester, not applicable or unspecified: Secondary | ICD-10-CM | POA: Diagnosis present

## 2022-05-27 DIAGNOSIS — O09299 Supervision of pregnancy with other poor reproductive or obstetric history, unspecified trimester: Secondary | ICD-10-CM

## 2022-05-27 DIAGNOSIS — Z3480 Encounter for supervision of other normal pregnancy, unspecified trimester: Principal | ICD-10-CM

## 2022-05-27 DIAGNOSIS — O09523 Supervision of elderly multigravida, third trimester: Secondary | ICD-10-CM | POA: Diagnosis not present

## 2022-05-27 DIAGNOSIS — O99019 Anemia complicating pregnancy, unspecified trimester: Secondary | ICD-10-CM | POA: Diagnosis present

## 2022-05-27 DIAGNOSIS — O139 Gestational [pregnancy-induced] hypertension without significant proteinuria, unspecified trimester: Secondary | ICD-10-CM | POA: Diagnosis not present

## 2022-05-27 DIAGNOSIS — O9902 Anemia complicating childbirth: Secondary | ICD-10-CM | POA: Diagnosis present

## 2022-05-27 DIAGNOSIS — O09522 Supervision of elderly multigravida, second trimester: Secondary | ICD-10-CM

## 2022-05-27 DIAGNOSIS — Z641 Problems related to multiparity: Secondary | ICD-10-CM

## 2022-05-27 LAB — CBC
HCT: 29.7 % — ABNORMAL LOW (ref 36.0–46.0)
Hemoglobin: 9.6 g/dL — ABNORMAL LOW (ref 12.0–15.0)
MCH: 24 pg — ABNORMAL LOW (ref 26.0–34.0)
MCHC: 32.3 g/dL (ref 30.0–36.0)
MCV: 74.3 fL — ABNORMAL LOW (ref 80.0–100.0)
Platelets: 152 10*3/uL (ref 150–400)
RBC: 4 MIL/uL (ref 3.87–5.11)
RDW: 14.8 % (ref 11.5–15.5)
WBC: 8.4 10*3/uL (ref 4.0–10.5)
nRBC: 0 % (ref 0.0–0.2)

## 2022-05-27 LAB — RPR: RPR Ser Ql: NONREACTIVE

## 2022-05-27 MED ORDER — ACETAMINOPHEN 325 MG PO TABS: 650.0000 mg | ORAL_TABLET | ORAL | Status: DC | PRN

## 2022-05-27 MED ORDER — NIFEDIPINE ER OSMOTIC RELEASE 30 MG PO TB24
30.0000 mg | ORAL_TABLET | Freq: Two times a day (BID) | ORAL | Status: DC
  Administered 2022-05-27 – 2022-05-28 (×2): 30 mg via ORAL
  Filled 2022-05-27 (×2): qty 1

## 2022-05-27 MED ORDER — TETANUS-DIPHTH-ACELL PERTUSSIS 5-2.5-18.5 LF-MCG/0.5 IM SUSY: 0.5000 mL | PREFILLED_SYRINGE | Freq: Once | INTRAMUSCULAR | Status: DC

## 2022-05-27 MED ORDER — LACTATED RINGERS IV SOLN: 500.0000 mL | INTRAVENOUS | Status: DC | PRN

## 2022-05-27 MED ORDER — SENNOSIDES-DOCUSATE SODIUM 8.6-50 MG PO TABS
2.0000 | ORAL_TABLET | ORAL | Status: DC
  Administered 2022-05-28: 2 via ORAL
  Filled 2022-05-27: qty 2

## 2022-05-27 MED ORDER — ONDANSETRON HCL 4 MG/2ML IJ SOLN: 4.0000 mg | Freq: Four times a day (QID) | INTRAMUSCULAR | Status: DC | PRN

## 2022-05-27 MED ORDER — OXYTOCIN-SODIUM CHLORIDE 30-0.9 UT/500ML-% IV SOLN: 1.0000 m[IU]/min | INTRAVENOUS | Status: DC

## 2022-05-27 MED ORDER — ZOLPIDEM TARTRATE 5 MG PO TABS: 5.0000 mg | ORAL_TABLET | Freq: Every evening | ORAL | Status: DC | PRN

## 2022-05-27 MED ORDER — OXYTOCIN-SODIUM CHLORIDE 30-0.9 UT/500ML-% IV SOLN: 2.5000 [IU]/h | INTRAVENOUS | Status: DC

## 2022-05-27 MED ORDER — LIDOCAINE HCL (PF) 1 % IJ SOLN: 30.0000 mL | INTRAMUSCULAR | Status: DC | PRN

## 2022-05-27 MED ORDER — LACTATED RINGERS IV SOLN: INTRAVENOUS | Status: DC

## 2022-05-27 MED ORDER — IBUPROFEN 600 MG PO TABS
600.0000 mg | ORAL_TABLET | Freq: Four times a day (QID) | ORAL | Status: DC
  Administered 2022-05-27 – 2022-05-28 (×4): 600 mg via ORAL
  Filled 2022-05-27 (×4): qty 1

## 2022-05-27 MED ORDER — DIBUCAINE (PERIANAL) 1 % EX OINT: 1.0000 | TOPICAL_OINTMENT | CUTANEOUS | Status: DC | PRN

## 2022-05-27 MED ORDER — IBUPROFEN 600 MG PO TABS
ORAL_TABLET | ORAL | Status: AC
  Filled 2022-05-27: qty 1

## 2022-05-27 MED ORDER — OXYCODONE-ACETAMINOPHEN 5-325 MG PO TABS: 2.0000 | ORAL_TABLET | ORAL | Status: DC | PRN

## 2022-05-27 MED ORDER — FENTANYL CITRATE (PF) 100 MCG/2ML IJ SOLN: 50.0000 ug | INTRAMUSCULAR | Status: DC | PRN

## 2022-05-27 MED ORDER — SOD CITRATE-CITRIC ACID 500-334 MG/5ML PO SOLN: 30.0000 mL | ORAL | Status: DC | PRN

## 2022-05-27 MED ORDER — OXYTOCIN BOLUS FROM INFUSION
333.0000 mL | Freq: Once | INTRAVENOUS | Status: AC
  Administered 2022-05-27: 333 mL via INTRAVENOUS

## 2022-05-27 MED ORDER — ONDANSETRON HCL 4 MG/2ML IJ SOLN: 4.0000 mg | INTRAMUSCULAR | Status: DC | PRN

## 2022-05-27 MED ORDER — COCONUT OIL OIL: 1.0000 | TOPICAL_OIL | Status: DC | PRN

## 2022-05-27 MED ORDER — TRANEXAMIC ACID-NACL 1000-0.7 MG/100ML-% IV SOLN: 1000.0000 mg | INTRAVENOUS | Status: DC

## 2022-05-27 MED ORDER — OXYCODONE-ACETAMINOPHEN 5-325 MG PO TABS: 1.0000 | ORAL_TABLET | ORAL | Status: DC | PRN

## 2022-05-27 MED ORDER — TERBUTALINE SULFATE 1 MG/ML IJ SOLN: 0.2500 mg | Freq: Once | INTRAMUSCULAR | Status: DC | PRN

## 2022-05-27 MED ORDER — DIPHENHYDRAMINE HCL 25 MG PO CAPS: 25.0000 mg | ORAL_CAPSULE | Freq: Four times a day (QID) | ORAL | Status: DC | PRN

## 2022-05-27 MED ORDER — OXYTOCIN-SODIUM CHLORIDE 30-0.9 UT/500ML-% IV SOLN
INTRAVENOUS | Status: AC
  Administered 2022-05-27: 2 m[IU]/min via INTRAVENOUS
  Filled 2022-05-27: qty 500

## 2022-05-27 MED ORDER — SIMETHICONE 80 MG PO CHEW: 80.0000 mg | CHEWABLE_TABLET | ORAL | Status: DC | PRN

## 2022-05-27 MED ORDER — PRENATAL MULTIVITAMIN CH: 1.0000 | ORAL_TABLET | Freq: Every day | ORAL | Status: DC

## 2022-05-27 MED ORDER — BENZOCAINE-MENTHOL 20-0.5 % EX AERO: 1.0000 | INHALATION_SPRAY | CUTANEOUS | Status: DC | PRN

## 2022-05-27 MED ORDER — TRANEXAMIC ACID-NACL 1000-0.7 MG/100ML-% IV SOLN
INTRAVENOUS | Status: AC
  Administered 2022-05-27: 1000 mg
  Filled 2022-05-27: qty 100

## 2022-05-27 MED ORDER — WITCH HAZEL-GLYCERIN EX PADS: 1.0000 | MEDICATED_PAD | CUTANEOUS | Status: DC | PRN

## 2022-05-27 MED ORDER — ONDANSETRON HCL 4 MG PO TABS: 4.0000 mg | ORAL_TABLET | ORAL | Status: DC | PRN

## 2022-05-27 NOTE — Plan of Care (Signed)
Kerrigan Gombos, RN 

## 2022-05-27 NOTE — MAU Note (Signed)
.  Deborah Santana is a 36 y.o. at [redacted]w[redacted]d here in MAU reporting: ctx since yesterday that are intermittent and comes and goes. Pt reports bloody show. Pt denies VB, LOF, DFM, and complications in the pregnancy.  GBS neg  Onset of complaint: yesterday  Pain score: pt unable to report  Vitals:   05/27/22 0602  BP: (!) 140/85  Pulse: 98  Resp: 12  Temp: 98.3 F (36.8 C)  SpO2: 98%     FHT:135 Lab orders placed from triage:

## 2022-05-27 NOTE — Discharge Summary (Signed)
Postpartum Discharge Summary  Date of Service updated***     Patient Name: Deborah Santana DOB: 18-Apr-1986 MRN: [id number]  Date of admission: 05/27/2022 Delivery date:05/27/2022  Delivering provider: Concepcion Living  Date of discharge: 05/27/2022  Admitting diagnosis: Normal labor [O80, Z37.9] Intrauterine pregnancy: [redacted]w[redacted]d     Secondary diagnosis:  Principal Problem:   Normal labor Active Problems:   Language barrier   Anemia in pregnancy   Supervision of other normal pregnancy, antepartum   AMA (advanced maternal age) multigravida 35+, second trimester   Anti-M isoimmunization affecting pregnancy, antepartum   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 yo S0Y3016 at [redacted]w[redacted]d was admitted in Active Labor on 05/27/2022. Patient had labor course as follows:  Membrane Rupture Time/Date: 10:30 AM ,05/27/2022   Delivery Method:Vaginal, Spontaneous , 45 second shoulder dystocia with McRoberts, Suprapubic pressure, and modified corkscrew  Episiotomy: None  Lacerations:  None  Patient had an uncomplicated postpartum course.  She is ambulating, tolerating a regular diet, passing flatus, and urinating well. Patient is discharged home in stable condition on 05/27/22.  Newborn Data: Birth date:05/27/2022  Birth time:11:58 AM  Gender:Female  Living status:Living  Apgars:7 ,9  Weight:3780 g   Magnesium Sulfate received: {Mag received:30440022} BMZ received: {BMZ received:30440023} Rhophylac:{Rhophylac  received:30440032} WFU:{XNA:35573220} T-DaP:{Tdap:23962} Flu: {URK:27062} Transfusion:{Transfusion received:30440034}  Physical exam  Vitals:   05/27/22 1216 05/27/22 1231 05/27/22 1246 05/27/22 1301  BP: (!) 151/81 139/79 138/75 139/72  Pulse: 86 81 84 83  Resp:  $Remo'18 16 16  'lWAjv$ Temp:      TempSrc:      SpO2:      Weight:      Height:       General: {Exam; general:21111117} Lochia: {Desc; appropriate/inappropriate:30686::"appropriate"} Uterine Fundus: {Desc; firm/soft:30687} Incision: {Exam; incision:21111123} DVT Evaluation: {Exam; dvt:2111122} Labs: Lab Results  Component Value Date   WBC 8.4 05/27/2022   HGB 9.6 (L) 05/27/2022   HCT 29.7 (L) 05/27/2022   MCV 74.3 (L) 05/27/2022   PLT 152 05/27/2022      Latest Ref Rng & Units 07/18/2016    3:35 PM  CMP  Glucose 65 - 99 mg/dL 110   BUN 6 - 20 mg/dL 8   Creatinine 0.44 - 1.00 mg/dL 0.57   Sodium 135 - 145 mmol/L 136   Potassium 3.5 - 5.1 mmol/L 3.2   Chloride 101 - 111 mmol/L 108   CO2 22 - 32 mmol/L 19   Calcium 8.9 - 10.3 mg/dL 9.2   Total Protein 6.5 - 8.1 g/dL 7.1   Total Bilirubin 0.3 - 1.2 mg/dL 0.5   Alkaline Phos 38 - 126 U/L 63   AST 15 - 41 U/L 25   ALT 14 - 54 U/L 13    Edinburgh Score:    02/12/2019    9:31 AM  Flavia Shipper Postnatal Depression  Scale Screening Tool  I have been able to laugh and see the funny side of things. 0  I have looked forward with enjoyment to things. 0  I have blamed myself unnecessarily when things went wrong. 0  I have been anxious or worried for no good reason. 0  I have felt scared or panicky for no good reason. 0  Things have been getting on top of me. 0  I have been so unhappy that I have had difficulty sleeping. 0  I have felt sad or miserable. 0  I have been so unhappy that I have been crying. 1  The thought of harming myself has occurred to me. 0  Edinburgh Postnatal Depression Scale Total 1     After visit meds:  Allergies as of 05/27/2022   No Known  Allergies   Med Rec must be completed prior to using this [facility]***        Discharge home in stable condition Infant Feeding: Breast Infant Disposition:home with mother Discharge instruction: per After Visit Summary and Postpartum booklet. Activity: Advance as tolerated. Pelvic rest for 6 weeks.  Diet: routine diet Future Appointments: Future Appointments  Date Time Provider [place]  06/02/2022  1:15 PM [facility] NST [facility]   Follow up Visit: Message sent 05/27/22  Please schedule this patient for a In person postpartum visit in 4 weeks with the following provider: Any provider. Additional Postpartum F/U: None   High risk pregnancy complicated by:  Single unbiblical artery, Hx of shoulder dystocia  Delivery mode:  Vaginal, Spontaneous  Anticipated Birth Control:  Unsure   05/27/2022 Concepcion Living, MD

## 2022-05-27 NOTE — H&P (Addendum)
OBSTETRIC ADMISSION HISTORY AND PHYSICAL  Deborah Santana is a 36 y.o. female [phone] with IUP at [redacted]w[redacted]d by U/S (12/22/2021) presenting for SVD. She reports +FMs, No LOF, no VB, no blurry vision, headaches or peripheral edema, and RUQ pain.  She plans on bottle and breast feeding. She is undecided for birth control. 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  Past Medical History: Past Medical History:  Diagnosis Date   Medical history non-contributory     Past Surgical History: Past Surgical History:  Procedure Laterality Date   COLPOSCOPY  02/26/2022   NO PAST SURGERIES      Obstetrical History: OB History     Gravida  7   Para  6   Term  6   Preterm      AB      Living  6      SAB      IAB      Ectopic      Multiple  0   Live Births  6           Social History Social History   Socioeconomic History   Marital status: Married    Spouse name: Not on file   Number of children: Not on file   Years of education: Not on file   Highest education level: Not on file  Occupational History   Not on file  Tobacco Use   Smoking status: Never   Smokeless tobacco: Never  Vaping Use   Vaping Use: Never used  Substance and Sexual Activity   Alcohol use: No   Drug use: No   Sexual activity: Yes    Birth control/protection: None    Comment: pt was on depo now wants nexplanon  Other Topics Concern   Not on file  Social History Narrative   Not on file   Social Determinants of Health   Financial Resource Strain: Not on file  Food Insecurity: Food Insecurity Present (05/03/2022)   Hunger Vital Sign    Worried About  Running Out of Food in the Last Year: Sometimes true    Ran Out of Food in the Last Year: Sometimes true  Transportation Needs: No Transportation Needs (05/03/2022)   PRAPARE - 05/05/2022 (Medical): No    Lack of Transportation (Non-Medical): No  Physical Activity: Not on file  Stress: Not on file  Social Connections: Not on file    Family History: Family History  Problem Relation Age of Onset   Diabetes Father    Asthma Neg Hx    Cancer Neg Hx    Heart disease Neg Hx    Hypertension Neg Hx    Obesity Neg Hx    Stroke Neg Hx     Allergies: No Known Allergies  Medications Prior to Admission  Medication Sig Dispense Refill Last Dose   aspirin 81 MG chewable tablet Chew 1 tablet (81 mg total) by mouth daily. (Patient not taking: Reported on 04/19/2022) 1 tablet 4    ferrous sulfate 325 (65 FE) MG tablet Take 1 tablet (325 mg total) by mouth every other day. (Patient not taking: Reported on 04/19/2022) 30 tablet 3  prenatal vitamin w/FE, FA (PRENATAL 1 + 1) 27-1 MG TABS tablet Take 1 tablet by mouth daily at 12 noon. 30 tablet 11      Review of Systems   All systems reviewed and negative except as stated in HPI  Blood pressure (!) 144/80, pulse 80, temperature 98.3 F (36.8 C), temperature source Oral, resp. rate 12, height 5\' 5"  (1.651 m), weight 91.8 kg, SpO2 98 %. General appearance: alert, cooperative, and mild distress Lungs: clear to auscultation bilaterally Heart: regular rate and rhythm Abdomen: soft, non-tender; bowel sounds normal Extremities: Homans sign is negative, no sign of DVT Presentation: cephalic Fetal monitoring Baseline: 135 bpm, Variability: Good {> 6 bpm), Accelerations: Reactive, and Decelerations: Absent Uterine activity: Date/time of onset: 05/26/2022, Frequency: Every 4 minutes, Duration: 1.5 seconds, and Intensity: moderate Dilation: 5 Effacement (%): 50 Station: -3 Exam by:: [email or web address], RN   Prenatal  labs: ABO, Rh: B/Positive/-- (04/20 1234) Antibody: Positive, See Final Results (06/29 0959) Rubella: 13.40 (04/20 1234) RPR: Non Reactive (06/29 0915)  HBsAg: Negative (04/20 1234)  HIV: Non Reactive (06/29 0915)  GBS: Negative/-- (08/21 1408)  2 hr Glucola: Early A1c 5.6, Third trimester 67/126/107 Genetic screening: NIPS: LR female, Horizon: Increased carrier risk SMA Anatomy 08-16-1999: 2VC - otherwise normal   Prenatal Transfer Tool  Maternal Diabetes: No Genetic Screening: Normal, Increased carrier risk SMA Maternal Ultrasounds/Referrals: Normal Fetal Ultrasounds or other Referrals:  None Maternal Substance Abuse:  No Significant Maternal Medications:  None Significant Maternal Lab Results:  Group B Strep negative Number of Prenatal Visits:greater than 3 verified prenatal visits  No results found for this or any previous visit (from the past 24 hour(s)).  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 [id number] at [redacted]w[redacted]d here for SVD.   #Labor: Will start pitocin 2x2. #Pain: Will ask for medication if necessary.  #FWB: Cat 1 #ID:  GBS negative  #MOF: Bottle and Breast #MOC: undecided #Circ:  Yes  #Single umbilical artery: Normal growth on 9/6 - 3497g, 61%ile    11/6, Medical Student   05/27/2022, 6:24 AM   ______________ 05/29/2022 of Supervision of Student:  I confirm that I have verified the information documented in the medical student's note and that I have also personally performed the history, physical exam and all medical decision making activities.  I have verified that all services and findings are accurately documented in this student's note; and I agree with management and plan as outlined in the documentation. I have also made any necessary editorial changes.  Flonnie Hailstone, DO Center for [facility], [facility] 05/27/2022 8:30 AM   Attestation of Attending Supervision of OB Fellow: Evaluation, management, and procedures were performed by the [facility] Fellow under my supervision and collaboration.  I have reviewed the OB Fellow's note and chart, and I agree with the management and plan.  Baldemar Lenis, MD, [facility] Attending Center for [employer] (Faculty Practice)  05/27/2022 11:09 AM

## 2022-05-28 ENCOUNTER — Other Ambulatory Visit (HOSPITAL_COMMUNITY): Payer: Self-pay

## 2022-05-28 LAB — CBC
HCT: 26.8 % — ABNORMAL LOW (ref 36.0–46.0)
Hemoglobin: 8.8 g/dL — ABNORMAL LOW (ref 12.0–15.0)
MCH: 24.1 pg — ABNORMAL LOW (ref 26.0–34.0)
MCHC: 32.8 g/dL (ref 30.0–36.0)
MCV: 73.4 fL — ABNORMAL LOW (ref 80.0–100.0)
Platelets: 142 10*3/uL — ABNORMAL LOW (ref 150–400)
RBC: 3.65 MIL/uL — ABNORMAL LOW (ref 3.87–5.11)
RDW: 14.9 % (ref 11.5–15.5)
WBC: 9.4 10*3/uL (ref 4.0–10.5)
nRBC: 0 % (ref 0.0–0.2)

## 2022-05-28 MED ORDER — FUROSEMIDE 20 MG PO TABS
20.0000 mg | ORAL_TABLET | Freq: Every day | ORAL | 0 refills | Status: DC
  Filled 2022-05-28: qty 5, 5d supply, fill #0

## 2022-05-28 MED ORDER — SODIUM CHLORIDE 0.9 % IV SOLN
500.0000 mg | Freq: Once | INTRAVENOUS | Status: AC
  Administered 2022-05-28: 500 mg via INTRAVENOUS
  Filled 2022-05-28: qty 25

## 2022-05-28 MED ORDER — NIFEDIPINE ER 30 MG PO TB24
30.0000 mg | ORAL_TABLET | Freq: Two times a day (BID) | ORAL | 0 refills | Status: DC
  Filled 2022-05-28: qty 60, 30d supply, fill #0

## 2022-05-28 NOTE — Discharge Instructions (Addendum)
-   Continue your prenatal vitamins especially if breastfeeding - Try to eat iron rich food. - Take iron supplement as prescribed every other day. Take along with fruit or orange juice, avoid taking within 30 mins of eating dairy (milk, cheese) - Take over the counter tylenol (500mg) or ibuprofen (200mg) three times a day as needed for cramping/pain. - continue blood pressure medication. - Take water pill (lasix) as prescribed for a total of 5 days. - follow up in clinic in 1 week for a blood pressure check and in 4-6 weeks as scheduled for your regular post partum visit. - Please come back to [facility] if you notice persistently elevated blood pressures or you start to have a headache, that doesn't get better with medications (tylenol and ibuprofen), rest (4hrs of sleep) and drinking water.  

## 2022-05-28 NOTE — Lactation Note (Signed)
This note was copied from a baby's chart. Lactation Consultation Note  Patient Name: Boy Geneve Kimpel EHOZY'Y Date: 05/28/2022   Age:38 hours Per RN Zigmund Daniel), Birth Parent declined [facility] services tonight. Maternal Data    Feeding    LATCH Score                    Lactation Tools Discussed/Used    Interventions    Discharge    Consult Status      Danelle Earthly 05/28/2022, 12:02 AM

## 2022-05-29 LAB — TYPE AND SCREEN
ABO/RH(D): B POS
Antibody Screen: POSITIVE
Donor AG Type: NEGATIVE
Donor AG Type: NEGATIVE
Unit division: 0
Unit division: 0

## 2022-05-29 LAB — BPAM RBC
Blood Product Expiration Date: 202309232359
Blood Product Expiration Date: 202309262359
ISSUE DATE / TIME: 202308191932
Unit Type and Rh: 5100
Unit Type and Rh: 5100

## 2022-06-02 ENCOUNTER — Other Ambulatory Visit: Payer: Medicaid Other

## 2022-06-07 ENCOUNTER — Telehealth (HOSPITAL_COMMUNITY): Payer: Self-pay | Admitting: *Deleted

## 2022-06-07 NOTE — Telephone Encounter (Signed)
Hospital Discharge Follow-Up Call:  Spoke to patient with help from telephonic interpreter "R[place] Jury [phone]".  Patient reports that she is well and has no concerns about her healing process.  EPDS today was 4 and she endorses this accurately reflects that she is doing well emotionally.  Patient says that baby is well and she has no concerns about baby's health.  She reports that baby sleeps in a crib.  Reviewed ABCs of Safe Sleep.

## 2022-07-02 ENCOUNTER — Ambulatory Visit: Payer: Medicaid Other | Admitting: Family Medicine

## 2022-07-05 ENCOUNTER — Ambulatory Visit: Payer: Medicaid Other | Admitting: Advanced Practice Midwife

## 2022-07-05 NOTE — Progress Notes (Deleted)
    [place] Partum Visit Note  Deborah Santana is a 36 y.o. [phone] female who presents for a postpartum visit. She is 5 weeks 4 days postpartum following a {method of delivery:313099}.  I have fully reviewed the prenatal and intrapartum course. The delivery was at [redacted]w[redacted]d.  Anesthesia: none. Postpartum course has been ***. Baby is doing well***. Baby is feeding by {breastmilk/bottle:69}. Bleeding {vag bleed:12292}. Bowel function is {normal:32111}. Bladder function is {normal:32111}. Patient {is/is not:9024} sexually active. Contraception method is {contraceptive method:5051}. Postpartum depression screening: {gen negative/positive:315881}.   The pregnancy intention screening data noted above was reviewed. Potential methods of contraception were discussed. The patient elected to proceed with No data recorded.    Health Maintenance Due  Topic Date Due   COVID-19 Vaccine (1) Never done   INFLUENZA VACCINE  04/13/2022    {Common ambulatory SmartLinks:19316}  Review of Systems {ros; complete:30496}  Objective:  LMP  (LMP Unknown)    General:  {gen appearance:16600}   Breasts:  {desc; normal/abnormal/not indicated:14647}  Lungs: {lung exam:16931}  Heart:  {heart exam:5510}  Abdomen: {abdomen exam:16834}   Wound {Wound assessment:11097}  GU exam:  {desc; normal/abnormal/not indicated:14647}       Assessment:    There are no diagnoses linked to this encounter.  *** postpartum exam.   Plan:   Essential components of care per ACOG recommendations:  1.  Mood and well being: Patient with {gen negative/positive:315881} depression screening today. Reviewed local resources for support.  - Patient tobacco use? {tobacco use:25506}  - hx of drug use? {yes/no:25505}    2. Infant care and feeding:  -Patient currently breastmilk feeding? {yes/no:25502}  -Social determinants of health (SDOH) reviewed in EPIC. No concerns***The following needs were identified***  3. Sexuality, contraception and  birth spacing - Patient {DOES_DOES GHW:29937} want a pregnancy in the next year.  Desired family size is {NUMBER 1-10:22536} children.  - Reviewed reproductive life planning. Reviewed contraceptive methods based on pt preferences and effectiveness.  Patient desired {Upstream End Methods:24109} today.   - Discussed birth spacing of 18 months  4. Sleep and fatigue -Encouraged family/partner/community support of 4 hrs of uninterrupted sleep to help with mood and fatigue  5. Physical Recovery  - Discussed patients delivery and complications. She describes her labor as {description:25511} - Patient had a {CHL AMB DELIVERY:[phone]}. Patient had a {laceration:25518} laceration. Perineal healing reviewed. Patient expressed understanding - Patient has urinary incontinence? {yes/no:25515} - Patient {ACTION; IS/IS JIR:67893810} safe to resume physical and sexual activity  6.  Health Maintenance - HM due items addressed {Yes or If no, why not?:20788} - Last pap smear  Diagnosis  Date Value Ref Range Status  01/21/2022 (A)  Final   - High grade squamous intraepithelial lesion (HSIL)   Pap smear {done:10129} at today's visit.  -Breast Cancer screening indicated? {indicated:25516}  7. Chronic Disease/Pregnancy Condition follow up: {Follow up:25499}  - PCP follow up  Annabell Howells, [place] for [employer], [place]

## 2022-07-22 ENCOUNTER — Ambulatory Visit (INDEPENDENT_AMBULATORY_CARE_PROVIDER_SITE_OTHER): Payer: Medicaid Other | Admitting: Student

## 2022-07-22 ENCOUNTER — Encounter: Payer: Self-pay | Admitting: Student

## 2022-07-22 ENCOUNTER — Other Ambulatory Visit: Payer: Self-pay

## 2022-07-22 DIAGNOSIS — G8929 Other chronic pain: Secondary | ICD-10-CM

## 2022-07-22 DIAGNOSIS — M545 Low back pain, unspecified: Secondary | ICD-10-CM

## 2022-07-22 DIAGNOSIS — Z789 Other specified health status: Secondary | ICD-10-CM | POA: Diagnosis not present

## 2022-07-22 DIAGNOSIS — D649 Anemia, unspecified: Secondary | ICD-10-CM

## 2022-07-22 MED ORDER — CYCLOBENZAPRINE HCL 10 MG PO TABS: 10.0000 mg | ORAL_TABLET | Freq: Every day | ORAL | 0 refills | Status: DC

## 2022-07-22 NOTE — Progress Notes (Addendum)
Post Partum Visit Note  Deborah Santana is a 36 y.o. [phone] female who presents for a postpartum visit. She is 8 weeks postpartum following a normal spontaneous vaginal delivery.  I have fully reviewed the prenatal and intrapartum course. The delivery was at 39 gestational weeks and 6 days.  Anesthesia: none. Postpartum course has been good. Baby is doing well. Baby is feeding by both breast and bottle - Similac Advance. Bleeding no bleeding. Bowel function is normal. Bladder function is normal. Patient is not sexually active. Contraception method is none. Postpartum depression screening: negative.  Complaining of back pain that feels like "throbbing abscess" that started prior to delivery. She says it is worse now and is a consistent pain. Does not notice any aggravating factors. Unrelieved with motrin or cold compress.  Denies any skin changes, weakness, headaches, or change in ambulation.   Health Maintenance Due  Topic Date Due   COVID-19 Vaccine (1) Never done   INFLUENZA VACCINE  04/13/2022    The following portions of the patient's history were reviewed and updated as appropriate: allergies, current medications, past family history, past medical history, past social history, past surgical history, and problem list.  Review of Systems Pertinent items are noted in HPI.  Objective:  BP (!) 142/79   Pulse 90   Wt 208 lb 6.4 oz (94.5 kg)   LMP  (LMP Unknown)   Breastfeeding Yes Comment: breast and bottle feeding  BMI 34.68 kg/m    General:  alert, cooperative, and appears stated age   Breasts:  normal  Lungs: clear to auscultation bilaterally  Heart:  regular rate and rhythm  Abdomen: soft, non-tender; bowel sounds normal; no masses,  no organomegaly   Wound Not present  GU exam:   declined       Assessment:    There are no diagnoses linked to this encounter.  Normal postpartum exam.   Plan:   Essential components of care per ACOG recommendations:  1.  Mood and well  being: Patient with negative depression screening today. Reviewed local resources for support.  - Patient tobacco use? No.   - hx of drug use? No.    2. Infant care and feeding:  -Patient currently breastmilk feeding? Yes. Reviewed importance of draining breast regularly to support lactation.  -Social determinants of health (SDOH) reviewed in EPIC. No concerns  3. Sexuality, contraception and birth spacing - Patient does not know want a pregnancy in the next year.  Desired family size is unknown number number of children children.  - Reviewed reproductive life planning. Reviewed contraceptive methods based on pt preferences and effectiveness.  Patient desired No Method - Other Reason today.   - Discussed birth spacing of 18 months  4. Sleep and fatigue -Encouraged family/partner/community support of 4 hrs of uninterrupted sleep to help with mood and fatigue  5. Physical Recovery  - Discussed patients delivery and complications. She describes her labor as good. - Patient had a Vaginal, no problems at delivery. Patient did not have laceration. Perineal healing reviewed. Patient expressed understanding - Patient has urinary incontinence? No. - Patient is safe to resume physical and sexual activity - Recommended warm compress to sore area of back.Flexeril at night time. Recommend PT and PCP follow-up.  6.  Health Maintenance - HM due items addressed Yes - Last pap smear  Diagnosis  Date Value Ref Range Status  01/21/2022 (A)  Final   - High grade squamous intraepithelial lesion (HSIL)   Pap smear not  done at today's visit. Discussed importance of Colposcopy. Patient plans to return for procedure -Breast Cancer screening indicated? No.   7. Chronic Disease/Pregnancy Condition follow up: Anemia and Abnormal pap smear  - PCP follow up - referral placed  Corlis Hove, NP Center for [employer], [facility]

## 2022-07-22 NOTE — Addendum Note (Signed)
Addended by: Corlis Hove on: 07/22/2022 06:00 PM   Modules accepted: Orders

## 2022-07-23 LAB — ANEMIA PROFILE B
Basophils Absolute: 0.1 10*3/uL (ref 0.0–0.2)
Basos: 1 %
EOS (ABSOLUTE): 0.5 10*3/uL — ABNORMAL HIGH (ref 0.0–0.4)
Eos: 9 %
Ferritin: 49 ng/mL (ref 15–150)
Folate: >20 ng/mL (ref >3.0)
Hematocrit: 35.9 % (ref 34.0–46.6)
Hemoglobin: 11.7 g/dL (ref 11.1–15.9)
Immature Grans (Abs): 0 10*3/uL (ref 0.0–0.1)
Immature Granulocytes: 0 %
Iron Saturation: 18 % (ref 15–55)
Iron: 71 ug/dL (ref 27–159)
Lymphocytes Absolute: 2.4 10*3/uL (ref 0.7–3.1)
Lymphs: 43 %
MCH: 25.3 pg — ABNORMAL LOW (ref 26.6–33.0)
MCHC: 32.6 g/dL (ref 31.5–35.7)
MCV: 78 fL — ABNORMAL LOW (ref 79–97)
Monocytes Absolute: 0.5 10*3/uL (ref 0.1–0.9)
Monocytes: 10 %
Neutrophils Absolute: 2 10*3/uL (ref 1.4–7.0)
Neutrophils: 37 %
Platelets: 170 10*3/uL (ref 150–450)
RBC: 4.62 x10E6/uL (ref 3.77–5.28)
RDW: 18.5 % — ABNORMAL HIGH (ref 11.7–15.4)
Retic Ct Pct: 1.6 % (ref 0.6–2.6)
Total Iron Binding Capacity: 394 ug/dL (ref 250–450)
UIBC: 323 ug/dL (ref 131–425)
Vitamin B-12: 611 pg/mL (ref 232–1245)
WBC: 5.5 10*3/uL (ref 3.4–10.8)
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
## 2022-08-10 ENCOUNTER — Ambulatory Visit: Payer: Medicaid Other | Attending: Student

## 2022-08-10 ENCOUNTER — Other Ambulatory Visit: Payer: Self-pay

## 2022-08-10 DIAGNOSIS — M5459 Other low back pain: Secondary | ICD-10-CM | POA: Insufficient documentation

## 2022-08-10 DIAGNOSIS — M545 Low back pain, unspecified: Secondary | ICD-10-CM | POA: Insufficient documentation

## 2022-08-10 DIAGNOSIS — M6281 Muscle weakness (generalized): Secondary | ICD-10-CM | POA: Insufficient documentation

## 2022-08-10 DIAGNOSIS — R293 Abnormal posture: Secondary | ICD-10-CM | POA: Diagnosis not present

## 2022-08-10 DIAGNOSIS — G8929 Other chronic pain: Secondary | ICD-10-CM | POA: Insufficient documentation

## 2022-08-16 ENCOUNTER — Telehealth: Payer: Self-pay | Admitting: Physical Therapy

## 2022-08-16 ENCOUNTER — Ambulatory Visit: Payer: Medicaid Other | Attending: Student | Admitting: Physical Therapy

## 2022-08-16 DIAGNOSIS — R293 Abnormal posture: Secondary | ICD-10-CM | POA: Insufficient documentation

## 2022-08-16 DIAGNOSIS — M5459 Other low back pain: Secondary | ICD-10-CM | POA: Insufficient documentation

## 2022-08-16 DIAGNOSIS — M6281 Muscle weakness (generalized): Secondary | ICD-10-CM | POA: Insufficient documentation

## 2022-08-16 NOTE — Telephone Encounter (Signed)
Contacted patient via interpreter after no-show to appointment. Voicemail was left asking patient to contact the clinic if she cannot attend her future appointments.

## 2022-08-17 ENCOUNTER — Encounter (HOSPITAL_COMMUNITY): Payer: Self-pay

## 2022-08-17 ENCOUNTER — Ambulatory Visit (HOSPITAL_COMMUNITY)
Admission: EM | Admit: 2022-08-17 | Discharge: 2022-08-17 | Disposition: A | Discharge status: Home / Self Care | Payer: Medicaid Other | Attending: Family Medicine | Admitting: Family Medicine

## 2022-08-17 DIAGNOSIS — M542 Cervicalgia: Secondary | ICD-10-CM

## 2022-08-17 DIAGNOSIS — M545 Low back pain, unspecified: Secondary | ICD-10-CM

## 2022-08-17 DIAGNOSIS — G8929 Other chronic pain: Secondary | ICD-10-CM | POA: Diagnosis not present

## 2022-08-17 MED ORDER — KETOROLAC TROMETHAMINE 30 MG/ML IJ SOLN
30.0000 mg | Freq: Once | INTRAMUSCULAR | Status: AC
  Administered 2022-08-17: 30 mg via INTRAMUSCULAR

## 2022-08-17 MED ORDER — METHOCARBAMOL 500 MG PO TABS: 500.0000 mg | ORAL_TABLET | Freq: Every evening | ORAL | 0 refills | Status: DC | PRN

## 2022-08-17 MED ORDER — KETOROLAC TROMETHAMINE 30 MG/ML IJ SOLN
INTRAMUSCULAR | Status: AC
  Filled 2022-08-17: qty 1

## 2022-08-17 MED ORDER — IBUPROFEN 800 MG PO TABS: 800.0000 mg | ORAL_TABLET | Freq: Three times a day (TID) | ORAL | 0 refills | Status: DC | PRN

## 2022-08-17 NOTE — ED Triage Notes (Signed)
Pt presents with neck pain x 4 days ago. Son states she has had back pain for a long time now. Pt states she has headaches.

## 2022-08-17 NOTE — ED Provider Notes (Signed)
[facility]    CSN: [id number] Arrival date & time: 08/17/22  1742      History   Chief Complaint Chief Complaint  Patient presents with   Neck Pain   Back Pain    HPI Deborah Santana is a 36 y.o. female.   HPI Here for back pain that is been going on for several months, beginning while she was pregnant.  She has had neck pain for the last 4 days.  No fall or trauma.  She delivered her infant about 2-1/2 months ago.  She had a postpartum visit in early November and she let her OB/GYN know about the back pain at that point.  Physical therapy has been ordered and she has had her evaluation, but missed the appointment that was yesterday.  She has not been taking medications for it  Not had a period since she delivered the baby, but she states it is not possible for her to be pregnant  No fever or cough.  No dysuria or hematuria.  No rash.  No bowel or bladder incontinence  Past Medical History:  Diagnosis Date   Medical history non-contributory     Patient Active Problem List   Diagnosis Date Noted   Normal labor 05/27/2022   Gestational hypertension 05/27/2022   Grand multipara 05/03/2022   Hx of shoulder dystocia in prior pregnancy, currently pregnant 04/01/2022   Poor compliance 03/11/2022   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 03/11/2022   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, antepartum 02/15/2022   Anti-M isoimmunization affecting pregnancy, 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   Supervision of other normal pregnancy, antepartum 12/31/2021   AMA (advanced maternal age) multigravida 35+, second trimester 12/31/2021   Anemia in pregnancy 11/16/2018   HSIL (high grade squamous intraepithelial lesion) on Pap smear of cervix 08/31/2016   Language barrier 08/27/2016    Past Surgical History:  Procedure Laterality Date   COLPOSCOPY  02/26/2022   NO PAST SURGERIES      OB History     Gravida  7   Para  7   Term  7   Preterm       AB      Living  7      SAB      IAB      Ectopic      Multiple  0   Live Births  7            Home Medications    Prior to Admission medications   Medication Sig Start Date End Date Taking? Authorizing Provider  ibuprofen (ADVIL) 800 MG tablet Take 1 tablet (800 mg total) by mouth every 8 (eight) hours as needed (pain). 08/17/22  Yes Rajni Holsworth, Janace Aris, MD  methocarbamol (ROBAXIN) 500 MG tablet Take 1 tablet (500 mg total) by mouth at bedtime as needed for muscle spasms. 08/17/22  Yes Zenia Resides, MD  ferrous sulfate 325 (65 FE) MG tablet Take 1 tablet (325 mg total) by mouth every other day. Patient not taking: Reported on 04/19/2022 01/05/22   Worthy Rancher, MD  furosemide (LASIX) 20 MG tablet Take 1 tablet (20 mg total) by mouth daily for 5 days. 05/28/22 06/02/22  Ndulue, Chiagoziem J, MD  NIFEdipine (ADALAT CC) 30 MG 24 hr tablet Take 1 tablet (30 mg total) by mouth 2 (two) times daily. 05/28/22 06/27/22  Ndulue, Chiagoziem J, MD  prenatal vitamin w/FE, FA (PRENATAL 1 + 1) 27-1 MG TABS tablet Take 1 tablet  by mouth daily at 12 noon. 01/21/22   Worthy Rancher, MD    Family History Family History  Problem Relation Age of Onset   Diabetes Father    Asthma Neg Hx    Cancer Neg Hx    Heart disease Neg Hx    Hypertension Neg Hx    Obesity Neg Hx    Stroke Neg Hx     Social History Social History   Tobacco Use   Smoking status: Never   Smokeless tobacco: Never  Vaping Use   Vaping Use: Never used  Substance Use Topics   Alcohol use: No   Drug use: No     Allergies   Patient has no known allergies.   Review of Systems Review of Systems   Physical Exam Triage Vital Signs ED Triage Vitals  Enc Vitals Group     BP 08/17/22 2019 124/84     Pulse Rate 08/17/22 2019 75     Resp 08/17/22 2019 18     Temp 08/17/22 2019 98.2 F (36.8 C)     Temp Source 08/17/22 2019 Oral     SpO2 08/17/22 2019 97 %     Weight --      Height --      Head  Circumference --      Peak Flow --      Pain Score 08/17/22 2018 10     Pain Loc --      Pain Edu? --      Excl. in GC? --    No data found.  Updated Vital Signs BP 124/84 (BP Location: Left Arm)   Pulse 75   Temp 98.2 F (36.8 C) (Oral)   Resp 18   LMP  (LMP Unknown)   SpO2 97%   Breastfeeding Unknown   Visual Acuity Right Eye Distance:   Left Eye Distance:   Bilateral Distance:    Right Eye Near:   Left Eye Near:    Bilateral Near:     Physical Exam Vitals reviewed.  Constitutional:      General: She is not in acute distress.    Appearance: She is not toxic-appearing or diaphoretic.  HENT:     Mouth/Throat:     Mouth: Mucous membranes are moist.  Eyes:     Extraocular Movements: Extraocular movements intact.     Pupils: Pupils are equal, round, and reactive to light.  Cardiovascular:     Rate and Rhythm: Normal rate and regular rhythm.     Heart sounds: No murmur heard. Pulmonary:     Effort: Pulmonary effort is normal.     Breath sounds: Normal breath sounds.  Musculoskeletal:     Cervical back: Neck supple.     Comments: There is spasm of the trapezius bilaterally right more than left.  She also is tender on both sides of her lumbosacral area.  Straight leg raise is negative.  Lymphadenopathy:     Cervical: No cervical adenopathy.  Skin:    Coloration: Skin is not jaundiced or pale.  Neurological:     General: No focal deficit present.     Mental Status: She is alert and oriented to person, place, and time.  Psychiatric:        Behavior: Behavior normal.      UC Treatments / Results  Labs (all labs ordered are listed, but only abnormal results are displayed) Labs Reviewed - No data to display  EKG   Radiology No results found.  Procedures  Procedures (including critical care time)  Medications Ordered in UC Medications  ketorolac (TORADOL) 30 MG/ML injection 30 mg (has no administration in time range)    Initial Impression /  Assessment and Plan / UC Course  I have reviewed the triage vital signs and the nursing notes.  Pertinent labs & imaging results that were available during my care of the patient were reviewed by me and considered in my medical decision making (see chart for details).        Ibuprofen is sent in.  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  I have discussed with her that I do think in the long-term the physical therapy will be helpful.  If she is not improving I do want her to follow-up with her primary care or OB/GYN.   Final Clinical Impressions(s) / UC Diagnoses   Final diagnoses:  Chronic bilateral low back pain without sciatica  Neck pain     Discharge Instructions      You have been given a shot of Toradol 30 mg today.  Take ibuprofen 800 mg--1 tab every 8 hours as needed for pain.  methocarbamol 500 mg--1 every bedtime as needed for muscle spasm and muscle pain  Please attend the physical therapy sessions--they can help in the long run  Please follow-up with your OB/GYN if this is not improving    ED Prescriptions     Medication Sig Dispense Auth. Provider   methocarbamol (ROBAXIN) 500 MG tablet Take 1 tablet (500 mg total) by mouth at bedtime as needed for muscle spasms. 10 tablet Zenia Resides, MD   ibuprofen (ADVIL) 800 MG tablet Take 1 tablet (800 mg total) by mouth every 8 (eight) hours as needed (pain). 21 tablet Dezmond Downie, Janace Aris, MD      PDMP not reviewed this encounter.   Zenia Resides, MD 08/17/22 2055

## 2022-08-17 NOTE — Discharge Instructions (Signed)
You have been given a shot of Toradol 30 mg today.  Take ibuprofen 800 mg--1 tab every 8 hours as needed for pain.  methocarbamol 500 mg--1 every bedtime as needed for muscle spasm and muscle pain  Please attend the physical therapy sessions--they can help in the long run  Please follow-up with your OB/GYN if this is not improving

## 2022-08-23 ENCOUNTER — Ambulatory Visit: Payer: Medicaid Other

## 2022-08-23 DIAGNOSIS — M6281 Muscle weakness (generalized): Secondary | ICD-10-CM

## 2022-08-23 DIAGNOSIS — R293 Abnormal posture: Secondary | ICD-10-CM | POA: Diagnosis not present

## 2022-08-23 DIAGNOSIS — M5459 Other low back pain: Secondary | ICD-10-CM

## 2022-08-23 NOTE — Therapy (Signed)
OUTPATIENT PHYSICAL THERAPY TREATMENT NOTE   Patient Name: Deborah Santana MRN: [id number] DOB:1986/05/31, 36 y.o., female Today's Date: 08/23/2022  PCP: none REFERRING PROVIDER: Corlis Hove, NP  END OF SESSION:   PT End of Session - 08/23/22 1141     Visit Number 2    Number of Visits 7    Date for PT Re-Evaluation 09/25/22    Authorization Type [employer]    Authorization Time Period 11/28-12/27    Authorization - Visit Number 1    Authorization - Number of Visits 4    PT Start Time 1145    PT Stop Time 1229    PT Time Calculation (min) 44 min    Activity Tolerance Patient tolerated treatment well    Behavior During Therapy [facility] for tasks assessed/performed             Past Medical History:  Diagnosis Date   Medical history non-contributory    Past Surgical History:  Procedure Laterality Date   COLPOSCOPY  02/26/2022   NO PAST SURGERIES     Patient Active Problem List   Diagnosis Date Noted   Normal labor 05/27/2022   Gestational hypertension 05/27/2022   Grand multipara 05/03/2022   Hx of shoulder dystocia in prior pregnancy, currently pregnant 04/01/2022   Poor compliance 03/11/2022   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 03/11/2022   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, antepartum 02/15/2022   Anti-M isoimmunization affecting pregnancy, 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   Supervision of other normal pregnancy, antepartum 12/31/2021   AMA (advanced maternal age) multigravida 35+, second trimester 12/31/2021   Anemia in pregnancy 11/16/2018   HSIL (high grade squamous intraepithelial lesion) on Pap smear of cervix 08/31/2016   Language barrier 08/27/2016    REFERRING DIAG: M54.50,G89.29 (ICD-10-CM) - Chronic bilateral low back pain without sciatica   THERAPY DIAG:  Other low back pain  Muscle weakness (generalized)  Abnormal posture  Rationale for Evaluation and Treatment Rehabilitation  PERTINENT HISTORY:  Recent  postpartum Grand multipara   PRECAUTIONS: none   SUBJECTIVE:                                                                                                                                                                                     In-person interpreter.  SUBJECTIVE STATEMENT:  Patient reports she is "not good." She went to the ED for her ongoing pain and was prescribed medication for her pain that helped for a little bit, but now it is the same.    PAIN:  Are you having pain? Yes: NPRS scale: 8/10 Pain location: neck to the back Pain description: ache Aggravating factors: unknown Relieving factors:  medication   OBJECTIVE: (objective measures completed at initial evaluation unless otherwise dated)  DIAGNOSTIC FINDINGS:  N/A   PATIENT SURVEYS:  N/A language    SCREENING FOR RED FLAGS: Bowel or bladder incontinence: No Spinal tumors: No Cauda equina syndrome: No Compression fracture: No Abdominal aneurysm: No   COGNITION: Overall cognitive status: Within functional limits for tasks assessed                          SENSATION: Not tested   MUSCLE LENGTH: Hamstrings: WNL bilaterally    POSTURE: increased lumbar lordosis   PALPATION: TTP bilateral lumbar paraspinals Normal L-spine mobility    LUMBAR ROM:    AROM eval 08/23/22  Flexion 50% limited* 25% limited pain  Extension 50% limited*   Right lateral flexion WNL   Left lateral flexion WNL   Right rotation WNL   Left rotation WNL    (Blank rows = not tested)            *pain      LOWER EXTREMITY MMT:     MMT Right eval Left eval  Hip flexion 4 4  Hip extension 4- 4-  Hip abduction 4- 3+   Hip adduction      Hip internal rotation      Hip external rotation      Knee flexion      Knee extension      Ankle dorsiflexion      Ankle plantarflexion      Ankle inversion      Core 2   (Blank rows = not tested)   LUMBAR SPECIAL TESTS:  SLR (-)    FUNCTIONAL TESTS:  5 x STS: 27 seconds  increased pain   GAIT: Distance walked: 10 ft  Assistive device utilized: None Level of assistance: Complete Independence Comments: slow gait speed, WBOS, limited hip flexion/extension throughout gait cycle.   [facility] Adult PT Treatment:                                                DATE: 08/23/22 Therapeutic Exercise: LTR with figure 4 x 1 minute each Sidelying thoracic rotation x 10 each  Single knee to chest x 10 each; 5 sec hold  Hip bridge 2 x 10  Supine pelvic tilts 2 x 10  Clamshells 2 x 10  SLR 2 x 10  Hooklying resisted hip abduction blue band 2 x 10  Updated HEP   [facility] Adult PT Treatment:                                                DATE: 08/10/22 Therapeutic Exercise: Demonstrated and issue initial HEP.      Therapeutic Activity: Education on assessment findings that will be addressed throughout duration of POC.            PATIENT EDUCATION:  Education details: see treatment  Person educated: Patient Education method: Explanation, Demonstration, Tactile cues, Verbal cues, and Handouts Education comprehension: verbalized understanding, returned demonstration, verbal cues required, tactile cues required, and needs further education   HOME EXERCISE PROGRAM: Access Code: [email or web address] URL: [email or web address] Date: 08/10/2022 Prepared by: Letitia Libra   Exercises - Supine Lower Trunk Rotation  -  2 x daily - 7 x weekly - 1 sets - 10 reps - 5 sec hold - Supine Figure 4 Piriformis Stretch  - 2 x daily - 7 x weekly - 3 sets - 30  hold - Hooklying Single Knee to Chest Stretch  - 2 x daily - 7 x weekly - 1 sets - 10 reps - 5 sec  hold - Supine Bridge  - 2 x daily - 7 x weekly - 2 sets - 10 reps   ASSESSMENT:   CLINICAL IMPRESSION: Patient arrives with high pain levels about the neck and back, but in NAD. Focused on improving lumbar mobility and introduction to core and hip strengthening with good tolerance.She reported increased back pain with hip  bridge, so this exercise was removed from HEP at this time. HEP was update to include further spinal mobility and hip strengthening. Lumbar flexion AROM has improved compared to initial evaluation, but continues to be painful.    OBJECTIVE IMPAIRMENTS: Abnormal gait, decreased activity tolerance, decreased endurance, decreased knowledge of condition, difficulty walking, decreased ROM, decreased strength, increased fascial restrictions, improper body mechanics, postural dysfunction, and pain.    ACTIVITY LIMITATIONS: carrying, lifting, bending, sitting, standing, squatting, sleeping, transfers, locomotion level, and caring for others   PARTICIPATION LIMITATIONS: meal prep, cleaning, laundry, driving, shopping, community activity, and occupation   PERSONAL FACTORS: Age, Fitness, Profession, Time since onset of injury/illness/exacerbation, and 1-2 comorbidities: see PMH above  are also affecting patient's functional outcome.    REHAB POTENTIAL: Fair chronicity of injury   CLINICAL DECISION MAKING: Evolving/moderate complexity   EVALUATION COMPLEXITY: Moderate     GOALS: Goals reviewed with patient? Yes   SHORT TERM GOALS: Target date: 08/31/2022       Patient will be independent and compliant with initial HEP.    Baseline: issued at eval Goal status: INITIAL   2.  Patient will improve lumbar flexion and extension AROM by 25% without an increase in pain to improve ability to complete reaching and bending activity.  Baseline: see above  Goal status: INITIAL   3.  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  Baseline: no knowledge; pain with lifting Goal status: INITIAL     LONG TERM GOALS: Target date: 09/21/2022       Patient will demonstrate at least 4+/5 bilateral hip abductor and extensor strength to improve stability about the chain with prolonged walking and standing activity.   Baseline: see above Goal status: INITIAL   2.  Patient will tolerate at least 20 minutes of continuous standing activity to improve her tolerance to household activities.  Baseline: 5 minutes  Goal status: INITIAL   3.  Patient will complete 5 x STS in </= 20 seconds without an increase in pain to improve ease of transfers.  Baseline: see above  Goal status: INITIAL   4.  Patient will be independent with advanced home program to assist in management of her chronic condition.  Baseline: n/a Goal status: INITIAL   5.  Patient will report pain as </= 5/10 to reduce her current functional limitations.  Baseline: see above  Goal status: INITIAL     PLAN:   PT FREQUENCY: 1x/week   PT DURATION: 6 weeks   PLANNED INTERVENTIONS: Therapeutic exercises, Therapeutic activity, Neuromuscular re-education, Balance training, Gait training, Patient/Family education, Self Care, Dry Needling, Cryotherapy, Moist heat, Manual therapy, and Re-evaluation.   PLAN FOR NEXT SESSION: review  and progress HEP prn; core strengthening, hip strengthening.    Letitia LibraSamantha Janal Haak, PT, DPT, ATC 08/23/22 12:29 PM

## 2022-08-30 ENCOUNTER — Other Ambulatory Visit (HOSPITAL_COMMUNITY)
Admission: RE | Admit: 2022-08-30 | Discharge: 2022-08-30 | Disposition: A | Discharge status: Home / Self Care | Payer: Medicaid Other | Source: Ambulatory Visit | Attending: Obstetrics and Gynecology | Admitting: Obstetrics and Gynecology

## 2022-08-30 ENCOUNTER — Ambulatory Visit: Payer: Medicaid Other

## 2022-08-30 ENCOUNTER — Ambulatory Visit (INDEPENDENT_AMBULATORY_CARE_PROVIDER_SITE_OTHER): Payer: Medicaid Other | Admitting: Obstetrics and Gynecology

## 2022-08-30 VITALS — BP 126/82 | HR 84

## 2022-08-30 DIAGNOSIS — R87613 High grade squamous intraepithelial lesion on cytologic smear of cervix (HGSIL): Secondary | ICD-10-CM

## 2022-08-30 DIAGNOSIS — Z3202 Encounter for pregnancy test, result negative: Secondary | ICD-10-CM | POA: Diagnosis not present

## 2022-08-30 DIAGNOSIS — Z789 Other specified health status: Secondary | ICD-10-CM

## 2022-08-30 DIAGNOSIS — N72 Inflammatory disease of cervix uteri: Secondary | ICD-10-CM | POA: Diagnosis not present

## 2022-08-30 LAB — POCT PREGNANCY, URINE: Preg Test, Ur: NEGATIVE

## 2022-08-30 NOTE — Progress Notes (Addendum)
    GYNECOLOGY OFFICE COLPOSCOPY PROCEDURE NOTE  36 y.o. [id number] here for colposcopy for high-grade squamous intraepithelial neoplasia  (HGSIL-encompassing moderate and severe dysplasia) pap smear on 01/2022. Discussed role for HPV in cervical dysplasia, need for surveillance.   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   Patient gave informed written consent, time out was performed.  Placed in lithotomy position. Cervix viewed with speculum and colposcope after application of acetic acid.   Colposcopy adequate? Yes  visible thickened lesion(s) from 11 to 1 o'clock and acetowhite lesion(s) noted at 6 oclock o'clock; corresponding biopsies obtained.  ECC specimen obtained. Persistent bleeding from biopsy sites.  Tender cervix upon palpation with cotton swab as well as with biopsies.  All specimens were labeled and sent to pathology.  Chaperone was present during entire procedure. Video interpreter used for duration of visit.   Patient was given post procedure instructions.  Will follow up pathology and manage accordingly; patient will be contacted with results and recommendations.  Routine preventative health maintenance measures emphasized.   Lorriane Shire, MD Obstetrician & Gynecologist, [facility] for [employer], [facility]

## 2022-08-30 NOTE — Therapy (Incomplete)
OUTPATIENT PHYSICAL THERAPY TREATMENT NOTE   Patient Name: Deborah Santana MRN: [id number] DOB:1986-08-26, 36 y.o., female Today's Date: 08/30/2022  PCP: none REFERRING PROVIDER: Corlis Hove, NP  END OF SESSION:     Past Medical History:  Diagnosis Date   Medical history non-contributory    Past Surgical History:  Procedure Laterality Date   COLPOSCOPY  02/26/2022   NO PAST SURGERIES     Patient Active Problem List   Diagnosis Date Noted   Normal labor 05/27/2022   Gestational hypertension 05/27/2022   Grand multipara 05/03/2022   Hx of shoulder dystocia in prior pregnancy, currently pregnant 04/01/2022   Poor compliance 03/11/2022   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 03/11/2022   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  Rationale for Evaluation and Treatment Rehabilitation  PERTINENT HISTORY:  Recent postpartum Grand multipara   PRECAUTIONS: none   SUBJECTIVE:                                                                                                                                                                                     In-person interpreter.  SUBJECTIVE STATEMENT:  Patient reports she is "not good." She went to the ED for her ongoing pain and was prescribed medication for her pain that helped for a little bit, but now it is the same.    PAIN:  Are you having pain? Yes: NPRS scale: 8/10 Pain location: neck  to the back Pain description: ache Aggravating factors: unknown Relieving factors: medication   OBJECTIVE: (objective measures completed at initial evaluation unless otherwise dated)  DIAGNOSTIC FINDINGS:  N/A   PATIENT SURVEYS:  N/A language    SCREENING FOR RED FLAGS: Bowel or bladder incontinence: No Spinal tumors: No Cauda equina syndrome: No Compression fracture: No Abdominal aneurysm: No   COGNITION: Overall cognitive status: Within functional limits for tasks assessed                          SENSATION: Not tested   MUSCLE LENGTH: Hamstrings: WNL bilaterally    POSTURE: increased lumbar lordosis   PALPATION: TTP bilateral lumbar paraspinals Normal L-spine mobility    LUMBAR ROM:  AROM eval 08/23/22  Flexion 50% limited* 25% limited pain  Extension 50% limited*   Right lateral flexion WNL   Left lateral flexion WNL   Right rotation WNL   Left rotation WNL    (Blank rows = not tested)            *pain      LOWER EXTREMITY MMT:     MMT Right eval Left eval  Hip flexion 4 4  Hip extension 4- 4-  Hip abduction 4- 3+   Hip adduction      Hip internal rotation      Hip external rotation      Knee flexion      Knee extension      Ankle dorsiflexion      Ankle plantarflexion      Ankle inversion      Core 2   (Blank rows = not tested)   LUMBAR SPECIAL TESTS:  SLR (-)    FUNCTIONAL TESTS:  5 x STS: 27 seconds increased pain   GAIT: Distance walked: 10 ft  Assistive device utilized: None Level of assistance: Complete Independence Comments: slow gait speed, WBOS, limited hip flexion/extension throughout gait cycle.   [facility] Adult PT Treatment:                                                DATE: 08/23/22 Therapeutic Exercise: LTR with figure 4 x 1 minute each Sidelying thoracic rotation x 10 each  Single knee to chest x 10 each; 5 sec hold  Hip bridge 2 x 10  Supine pelvic tilts 2 x 10  Clamshells 2 x 10  SLR 2 x 10  Hooklying  resisted hip abduction blue band 2 x 10  Updated HEP   [facility] Adult PT Treatment:                                                DATE: 08/10/22 Therapeutic Exercise: Demonstrated and issue initial HEP.      Therapeutic Activity: Education on assessment findings that will be addressed throughout duration of POC.            PATIENT EDUCATION:  Education details: see treatment  Person educated: Patient Education method: Explanation, Demonstration, Tactile cues, Verbal cues, and Handouts Education comprehension: verbalized understanding, returned demonstration, verbal cues required, tactile cues required, and needs further education   HOME EXERCISE PROGRAM: Access Code: [email or web address] URL: [email or web address] Date: 08/10/2022 Prepared by: Letitia Libra   Exercises - Supine Lower Trunk Rotation  - 2 x daily - 7 x weekly - 1 sets - 10 reps - 5 sec hold - Supine Figure 4 Piriformis Stretch  - 2 x daily - 7 x weekly - 3 sets - 30  hold - Hooklying Single Knee to Chest Stretch  - 2 x daily - 7 x weekly - 1 sets - 10 reps - 5 sec  hold - Supine Bridge  - 2 x daily - 7 x weekly - 2 sets - 10 reps   ASSESSMENT:   CLINICAL IMPRESSION: Patient arrives with high pain levels about the neck and back, but in NAD. Focused on improving lumbar mobility and introduction to core and hip strengthening with good tolerance.She  reported increased back pain with hip bridge, so this exercise was removed from HEP at this time. HEP was update to include further spinal mobility and hip strengthening. Lumbar flexion AROM has improved compared to initial evaluation, but continues to be painful.    OBJECTIVE IMPAIRMENTS: Abnormal gait, decreased activity tolerance, decreased endurance, decreased knowledge of condition, difficulty walking, decreased ROM, decreased strength, increased fascial restrictions, improper body mechanics, postural dysfunction, and pain.    ACTIVITY LIMITATIONS: carrying,  lifting, bending, sitting, standing, squatting, sleeping, transfers, locomotion level, and caring for others   PARTICIPATION LIMITATIONS: meal prep, cleaning, laundry, driving, shopping, community activity, and occupation   PERSONAL FACTORS: Age, Fitness, Profession, Time since onset of injury/illness/exacerbation, and 1-2 comorbidities: see PMH above  are also affecting patient's functional outcome.    REHAB POTENTIAL: Fair chronicity of injury   CLINICAL DECISION MAKING: Evolving/moderate complexity   EVALUATION COMPLEXITY: Moderate     GOALS: Goals reviewed with patient? Yes   SHORT TERM GOALS: Target date: 08/31/2022       Patient will be independent and compliant with initial HEP.    Baseline: issued at eval Goal status: INITIAL   2.  Patient will improve lumbar flexion and extension AROM by 25% without an increase in pain to improve ability to complete reaching and bending activity.  Baseline: see above  Goal status: INITIAL   3.  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  Baseline: no knowledge; pain with lifting Goal status: INITIAL     LONG TERM GOALS: Target date: 09/21/2022       Patient will demonstrate at least 4+/5 bilateral hip abductor and extensor strength to improve stability about the chain with prolonged walking and standing activity.  Baseline: see above Goal status: INITIAL   2.  Patient will tolerate at least 20 minutes of continuous standing activity to improve her tolerance to household activities.  Baseline: 5 minutes  Goal status: INITIAL   3.  Patient will complete 5 x STS in </= 20 seconds without an increase in pain to improve ease of transfers.  Baseline: see above  Goal status: INITIAL   4.  Patient will be independent with advanced home program to assist in management of her chronic condition.  Baseline: n/a Goal status: INITIAL   5.   Patient will report pain as </= 5/10 to reduce her current functional limitations.  Baseline: see above  Goal status: INITIAL     PLAN:   PT FREQUENCY: 1x/week   PT DURATION: 6 weeks   PLANNED INTERVENTIONS: Therapeutic exercises, Therapeutic activity, Neuromuscular re-education, Balance training, Gait training, Patient/Family education, Self Care, Dry Needling, Cryotherapy, Moist heat, Manual therapy, and Re-evaluation.   PLAN FOR NEXT SESSION: review and progress HEP prn; core strengthening, hip strengthening.    Letitia Libra, PT, DPT, ATC 08/30/22 8:32 AM

## 2022-09-01 LAB — SURGICAL PATHOLOGY

## 2022-09-03 ENCOUNTER — Ambulatory Visit: Payer: Medicaid Other | Admitting: Physical Therapy

## 2022-09-08 ENCOUNTER — Ambulatory Visit: Payer: Medicaid Other

## 2022-09-10 ENCOUNTER — Telehealth: Payer: Self-pay | Admitting: Obstetrics and Gynecology

## 2022-09-10 NOTE — Telephone Encounter (Signed)
[employer] interpreter line used to contact patient  Interpreter [phone]  Attempted to reach patient x3 and patient unavailable. Will route message to clinical staff to reach out to notify of abnormal pathology results and recommendation for LEEP.

## 2022-09-14 ENCOUNTER — Telehealth: Payer: Self-pay

## 2022-09-14 ENCOUNTER — Ambulatory Visit: Payer: Medicaid Other | Attending: Student

## 2022-09-14 DIAGNOSIS — M6281 Muscle weakness (generalized): Secondary | ICD-10-CM | POA: Diagnosis present

## 2022-09-14 DIAGNOSIS — R293 Abnormal posture: Secondary | ICD-10-CM

## 2022-09-14 DIAGNOSIS — M5459 Other low back pain: Secondary | ICD-10-CM

## 2022-09-14 NOTE — Telephone Encounter (Signed)
Called patient to discuss results per C. Ajewole MD note. Called patient using [employer] [phone]. Results given. Patient verbalized understanding, questions answered. Denied further questions. Patient requesting procedure be scheduled for Monday or Friday. I will let front office know.   Darlyne Russian, RN

## 2022-09-14 NOTE — Therapy (Signed)
OUTPATIENT PHYSICAL THERAPY TREATMENT NOTE/RE-CERTIFICATION   Patient Name: Deborah Santana MRN: [id number] DOB:1985-12-02, 37 y.o., female Today's Date: 09/15/2022  PCP: none REFERRING PROVIDER: Johnston Ebbs, NP  END OF SESSION:   PT End of Session - 09/14/22 1612     Visit Number 3    Number of Visits 9    Date for PT Re-Evaluation 10/30/22    Authorization Type [employer]    Authorization Time Period out of date range- requesting auth    Authorization - Visit Number --    Authorization - Number of Visits --    PT Start Time 8250    PT Stop Time 1658    PT Time Calculation (min) 43 min    Activity Tolerance Patient tolerated treatment well    Behavior During Therapy [facility] for tasks assessed/performed              Past Medical History:  Diagnosis Date   Medical history non-contributory    Past Surgical History:  Procedure Laterality Date   COLPOSCOPY  02/26/2022   NO PAST SURGERIES     Patient Active Problem List   Diagnosis Date Noted   Normal labor 05/27/2022   Gestational hypertension 05/27/2022   Grand multipara 05/03/2022   Hx of shoulder dystocia in prior pregnancy, currently pregnant 04/01/2022   Poor compliance 03/11/2022   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 03/70/4888   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, antepartum 02/15/2022   Anti-M isoimmunization affecting pregnancy, 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   Supervision of other normal pregnancy, antepartum 12/31/2021   AMA (advanced maternal age) multigravida 35+, second trimester 12/31/2021   Anemia in pregnancy 11/16/2018   HSIL (high grade squamous intraepithelial lesion) on Pap smear of cervix 08/31/2016   Language barrier 08/27/2016    REFERRING DIAG: M54.50,G89.29 (ICD-10-CM) - Chronic bilateral low back pain without sciatica   THERAPY DIAG:  Other low back pain  Muscle weakness (generalized)  Abnormal posture  Rationale for Evaluation and Treatment  Rehabilitation  PERTINENT HISTORY:  Recent postpartum Grand multipara   PRECAUTIONS: none   SUBJECTIVE:                                                                                                                                                                                     In-person interpreter.  SUBJECTIVE STATEMENT:  Patient reports she is a little bit better.    PAIN:  Are you having pain? Yes: NPRS scale: 6/10 Pain location:  back Pain description: ache Aggravating factors: unknown Relieving factors: medication   OBJECTIVE: (objective measures completed at initial evaluation unless otherwise dated)  DIAGNOSTIC FINDINGS:  N/A   PATIENT SURVEYS:  N/A language    SCREENING FOR RED FLAGS: Bowel or bladder incontinence: No Spinal tumors: No Cauda equina syndrome: No Compression fracture: No Abdominal aneurysm: No   COGNITION: Overall cognitive status: Within functional limits for tasks assessed                          SENSATION: Not tested   MUSCLE LENGTH: Hamstrings: WNL bilaterally    POSTURE: increased lumbar lordosis   PALPATION: TTP bilateral lumbar paraspinals Normal L-spine mobility    LUMBAR ROM:    AROM eval 08/23/22 09/14/22  Flexion 50% limited* 25% limited pain 25% limited tightness  Extension 50% limited*  WNL  Right lateral flexion WNL  WNL  Left lateral flexion WNL  WNL  Right rotation WNL  WNL  Left rotation WNL  WNL   (Blank rows = not tested)            *pain      LOWER EXTREMITY MMT:     MMT Right eval Left eval 09/14/22  Hip flexion 4 4 5/5 bilateral   Hip extension 4- 4- 4/5 bilateral  Hip abduction 4- 3+  4-/5 bilateral  Hip adduction       Hip internal rotation       Hip external rotation       Knee flexion       Knee extension       Ankle dorsiflexion       Ankle plantarflexion       Ankle inversion       Core 2    (Blank rows = not tested)   LUMBAR SPECIAL TESTS:  SLR (-)    FUNCTIONAL TESTS:  5 x  STS: 27 seconds increased pain     GAIT: Distance walked: 10 ft  Assistive device utilized: None Level of assistance: Complete Independence Comments: slow gait speed, WBOS, limited hip flexion/extension throughout gait cycle.  [facility] Adult PT Treatment:                                                DATE: 09/14/22 Therapeutic Exercise: NuStep level 5 x 5 minutes  Stability ball rollout x 1 minute Sit to stand 2 x 10  Single knee to chest x 10 each  LTR x 1 minute  Standing hip abduction green band 2 x 10  Standing hip extension green band 2 x 10  Supine posterior pelvic tilts x 10  Supine TA march 2 x 10  Sidelying hip abduction 2 x 10  Updated HEP      [facility] Adult PT Treatment:                                                DATE: 08/23/22 Therapeutic Exercise: LTR with figure 4 x 1 minute each Sidelying thoracic rotation x 10 each  Single knee to chest x 10 each; 5 sec hold  Hip bridge 2 x 10  Supine pelvic tilts 2 x 10  Clamshells 2 x 10  SLR 2 x 10  Hooklying resisted hip abduction blue band 2 x 10  Updated HEP   [facility] Adult PT Treatment:  DATE: 08/10/22 Therapeutic Exercise: Demonstrated and issue initial HEP.      Therapeutic Activity: Education on assessment findings that will be addressed throughout duration of POC.            PATIENT EDUCATION:  Education details: see treatment  Person educated: Patient Education method: Explanation, Demonstration, Tactile cues, Verbal cues, and Handouts Education comprehension: verbalized understanding, returned demonstration, verbal cues required, tactile cues required, and needs further education   HOME EXERCISE PROGRAM: Access Code: [email or web address] URL: [email or web address] Date: 08/10/2022 Prepared by: Gwendolyn Grant   Exercises - Supine Lower Trunk Rotation  - 2 x daily - 7 x weekly - 1 sets - 10 reps - 5 sec hold - Supine Figure 4 Piriformis Stretch  - 2 x  daily - 7 x weekly - 3 sets - 30  hold - Hooklying Single Knee to Chest Stretch  - 2 x daily - 7 x weekly - 1 sets - 10 reps - 5 sec  hold - Supine Bridge  - 2 x daily - 7 x weekly - 2 sets - 10 reps   ASSESSMENT:   CLINICAL IMPRESSION: Patient has attended 2 treatment sessions since the start of care on 08/10/22 reporting slight improvement in her back pain. She demonstrates pain free lumbar AROM with slight limitation remaining in trunk flexion. She demonstrates improvement in hip strength, but continues to have core/hip strength deficits and limited tolerance to standing activity. Began working on Economist with lifting today with patient requiring heavy cues initially with proper squat form, but with continued reps is able to properly perform sit <> stand. She will benefit from continued skilled PT 1 x week for 6 weeks to address the above lingering deficits in order to optimize her function and assist in overall pain reduction.    OBJECTIVE IMPAIRMENTS: Abnormal gait, decreased activity tolerance, decreased endurance, decreased knowledge of condition, difficulty walking, decreased ROM, decreased strength, increased fascial restrictions, improper body mechanics, postural dysfunction, and pain.    ACTIVITY LIMITATIONS: carrying, lifting, bending, sitting, standing, squatting, sleeping, transfers, locomotion level, and caring for others   PARTICIPATION LIMITATIONS: meal prep, cleaning, laundry, driving, shopping, community activity, and occupation   PERSONAL FACTORS: Age, Fitness, Profession, Time since onset of injury/illness/exacerbation, and 1-2 comorbidities: see PMH above  are also affecting patient's functional outcome.    REHAB POTENTIAL: Fair chronicity of injury   CLINICAL DECISION MAKING: Evolving/moderate complexity   EVALUATION COMPLEXITY: Moderate     GOALS: Goals reviewed with patient? Yes   SHORT TERM GOALS: Target date: 08/31/2022       Patient will be  independent and compliant with initial HEP.    Baseline: issued at eval Goal status: met   2.  Patient will improve lumbar flexion and extension AROM by 25% without an increase in pain to improve ability to complete reaching and bending activity.  Baseline: see above  Goal status: met   3.  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  Baseline: no knowledge; pain with lifting Status: progressing lifting as appropriate; cues for sit to stand 09/14/22 Goal status: ongoing      LONG TERM GOALS: Target date: 09/21/2022       Patient will demonstrate at least 4+/5 bilateral hip abductor and extensor strength to improve stability about the chain with prolonged walking and standing activity.  Baseline: see above Goal status: partially met   2.  Patient will  tolerate at least 20 minutes of continuous standing activity to improve her tolerance to household activities.  Baseline: 5 minutes  Status: 5 minutes of standing exercise 09/14/22 Goal status: ongoing   3.  Patient will complete 5 x STS in </= 20 seconds without an increase in pain to improve ease of transfers.  Baseline: see above  Goal status: not assessed    4.  Patient will be independent with advanced home program to assist in management of her chronic condition.  Baseline: n/a Goal status: ongoing   5.  Patient will report pain as </= 5/10 to reduce her current functional limitations.  Baseline: see above  Goal status: progressing       PLAN:   PT FREQUENCY: 1x/week   PT DURATION: 6 weeks   PLANNED INTERVENTIONS: Therapeutic exercises, Therapeutic activity, Neuromuscular re-education, Balance training, Gait training, Patient/Family education, Self Care, Dry Needling, Cryotherapy, Moist heat, Manual therapy, and Re-evaluation.   PLAN FOR NEXT SESSION: review and progress HEP prn; core strengthening, hip strengthening.     Gwendolyn Grant, PT, DPT, ATC 09/15/22 11:15 AM   Check all possible CPT codes: 39672 - PT Re-evaluation, 97110- Therapeutic Exercise, [phone]- Neuro Re-education, [phone] - Gait Training, [phone] - Manual Therapy, 97530 - Therapeutic Activities, and 97535 - Self Care                         Check all conditions that are expected to impact treatment: Current pregnancy or recent postpartum

## 2022-09-14 NOTE — Telephone Encounter (Signed)
-----   Message from Darliss Cheney, MD sent at 09/10/2022  4:30 PM EST ----- Patient called. Unable to reach patient. please contact and make aware that biopsy results were abnormal  and recommend a procedure to remove the abnormal cervical tissue with a LEEP. This can be scheduled in office.

## 2022-09-15 ENCOUNTER — Encounter: Payer: Self-pay | Admitting: Nurse Practitioner

## 2022-09-15 ENCOUNTER — Ambulatory Visit (INDEPENDENT_AMBULATORY_CARE_PROVIDER_SITE_OTHER): Payer: Medicaid Other | Admitting: Nurse Practitioner

## 2022-09-15 ENCOUNTER — Ambulatory Visit: Payer: Self-pay | Admitting: Nurse Practitioner

## 2022-09-15 VITALS — BP 133/72 | HR 86 | Temp 97.5°F | Wt 214.4 lb

## 2022-09-15 DIAGNOSIS — E669 Obesity, unspecified: Secondary | ICD-10-CM | POA: Diagnosis not present

## 2022-09-15 DIAGNOSIS — R87613 High grade squamous intraepithelial lesion on cytologic smear of cervix (HGSIL): Secondary | ICD-10-CM | POA: Diagnosis not present

## 2022-09-15 DIAGNOSIS — G8929 Other chronic pain: Secondary | ICD-10-CM

## 2022-09-15 DIAGNOSIS — M545 Low back pain, unspecified: Secondary | ICD-10-CM | POA: Diagnosis not present

## 2022-09-15 NOTE — Assessment & Plan Note (Signed)
Take Tylenol 650 mg every 6 hours as needed. Continue physical therapy as ordered Application of heat and ice encouraged She is currently lactating will avoid use of PO NSAIDs at this time

## 2022-09-15 NOTE — Assessment & Plan Note (Signed)
Wt Readings from Last 3 Encounters:  09/15/22 214 lb 6.4 oz (97.3 kg)  07/22/22 208 lb 6.4 oz (94.5 kg)  05/27/22 202 lb 6.4 oz (91.8 kg)  Patient encouraged to engage in regular moderate to vigorous exercises at least 150 minutes weekly. She was also counseled on low-carb modified diet Benefits of healthy weight discussed Body mass index is 35.68 kg/m.

## 2022-09-15 NOTE — Progress Notes (Addendum)
New Patient Office Visit  Subjective    Patient ID: Deborah Santana, female    DOB: Feb 03, 1986  Age: 37 y.o. MRN: [id number]  CC:  Chief Complaint  Patient presents with   Establish Care    Not fasting     HPI Deborah Santana past medical history of chronic bilateral low back pain without sciatica, gestational hypertension, anemia during pregnancy presents to establish care with new provider. Interpretation services provided by a medical interpreter today   Chronic bilateral low back pain.  She is currently undergoing physical therapy.  States that physical therapy is helping.  She currently has aching back pain rated 7/10.  She is not taking any medication currently.  She denies fever, chills, dizziness, shortness of breath, incontinence.  HSIL. She states that she is aware of the recommendation for LEEP due to abnormal pathology and she plans on following up with OB/GYN for this.   Outpatient Encounter Medications as of 09/15/2022  Medication Sig   ibuprofen (ADVIL) 800 MG tablet Take 1 tablet (800 mg total) by mouth every 8 (eight) hours as needed (pain).   ferrous sulfate 325 (65 FE) MG tablet Take 1 tablet (325 mg total) by mouth every other day. (Patient not taking: Reported on 04/19/2022)   furosemide (LASIX) 20 MG tablet Take 1 tablet (20 mg total) by mouth daily for 5 days. (Patient not taking: Reported on 09/15/2022)   methocarbamol (ROBAXIN) 500 MG tablet Take 1 tablet (500 mg total) by mouth at bedtime as needed for muscle spasms. (Patient not taking: Reported on 08/30/2022)   NIFEdipine (ADALAT CC) 30 MG 24 hr tablet Take 1 tablet (30 mg total) by mouth 2 (two) times daily. (Patient not taking: Reported on 09/15/2022)   prenatal vitamin w/FE, FA (PRENATAL 1 + 1) 27-1 MG TABS tablet Take 1 tablet by mouth daily at 12 noon. (Patient not taking: Reported on 08/30/2022)   No facility-administered encounter medications on file as of 09/15/2022.    Past Medical History:  Diagnosis Date    Medical history non-contributory     Past Surgical History:  Procedure Laterality Date   COLPOSCOPY  02/26/2022   NO PAST SURGERIES      Family History  Problem Relation Age of Onset   Diabetes Father    Asthma Neg Hx    Cancer Neg Hx    Heart disease Neg Hx    Hypertension Neg Hx    Obesity Neg Hx    Stroke Neg Hx     Social History   Socioeconomic History   Marital status: Married    Spouse name: Not on file   Number of children: 7   Years of education: Not on file   Highest education level: Not on file  Occupational History   Not on file  Tobacco Use   Smoking status: Never   Smokeless tobacco: Never  Vaping Use   Vaping Use: Never used  Substance and Sexual Activity   Alcohol use: No   Drug use: No   Sexual activity: Yes    Birth control/protection: None    Comment: pt was on depo now wants nexplanon  Other Topics Concern   Not on file  Social History Narrative   Lives with her husband and children.    Social Determinants of Health   Financial Resource Strain: Not on file  Food Insecurity: Food Insecurity Present (05/03/2022)   Hunger Vital Sign    Worried About Running Out of Food in the Last Year:  Sometimes true    Ran Out of Food in the Last Year: Sometimes true  Transportation Needs: No Transportation Needs (05/03/2022)   PRAPARE - Hydrologist (Medical): No    Lack of Transportation (Non-Medical): No  Physical Activity: Not on file  Stress: Not on file  Social Connections: Not on file  Intimate Partner Violence: Not on file    Review of Systems  Constitutional: Negative.  Negative for chills, diaphoresis, fever, malaise/fatigue and weight loss.  Respiratory: Negative.  Negative for cough, hemoptysis, sputum production and shortness of breath.   Cardiovascular: Negative.  Negative for chest pain, palpitations, orthopnea, claudication and leg swelling.  Gastrointestinal: Negative.  Negative for abdominal pain,  heartburn, nausea and vomiting.  Genitourinary: Negative.   Musculoskeletal:  Positive for back pain. Negative for falls and neck pain.  Neurological: Negative.  Negative for dizziness, tingling, tremors and headaches.  Psychiatric/Behavioral: Negative.  Negative for depression, hallucinations, substance abuse and suicidal ideas.         Objective    BP 133/72   Pulse 86   Temp (!) 97.5 F (36.4 C)   Wt 214 lb 6.4 oz (97.3 kg)   SpO2 100%   BMI 35.68 kg/m   Physical Exam Constitutional:      General: She is not in acute distress.    Appearance: She is obese. She is not ill-appearing, toxic-appearing or diaphoretic.  Eyes:     General: No scleral icterus.       Right eye: No discharge.        Left eye: No discharge.     Extraocular Movements: Extraocular movements intact.  Cardiovascular:     Rate and Rhythm: Normal rate and regular rhythm.     Pulses: Normal pulses.     Heart sounds: Normal heart sounds. No murmur heard.    No friction rub. No gallop.  Pulmonary:     Effort: Pulmonary effort is normal. No respiratory distress.     Breath sounds: Normal breath sounds. No stridor. No wheezing, rhonchi or rales.  Chest:     Chest wall: No tenderness.  Abdominal:     General: There is no distension.     Palpations: Abdomen is soft.     Tenderness: There is no abdominal tenderness. There is no guarding.  Musculoskeletal:        General: Tenderness present. No swelling, deformity or signs of injury.     Right lower leg: No edema.     Left lower leg: No edema.     Comments: Tenderness on palpation of mid low back, no swelling or redness noted, skin warm and dry  Skin:    General: Skin is warm and dry.     Capillary Refill: Capillary refill takes less than 2 seconds.  Neurological:     Mental Status: She is alert.  Psychiatric:        Mood and Affect: Mood normal.        Behavior: Behavior normal.        Thought Content: Thought content normal.        Judgment:  Judgment normal.         Assessment & Plan:   Problem List Items Addressed This Visit       Other   HSIL (high grade squamous intraepithelial lesion) on Pap smear of cervix    She states that she is aware of the recommendation for LEEP due to abnormal pathology and she plans on following  up with OB/GYN for this.       Chronic bilateral low back pain - Primary    Take Tylenol 650 mg every 6 hours as needed. Continue physical therapy as ordered Application of heat and ice encouraged She is currently lactating will avoid use of PO NSAIDs at this time      Obesity (BMI 35.0-39.9 without comorbidity)    Wt Readings from Last 3 Encounters:  09/15/22 214 lb 6.4 oz (97.3 kg)  07/22/22 208 lb 6.4 oz (94.5 kg)  05/27/22 202 lb 6.4 oz (91.8 kg)  Patient encouraged to engage in regular moderate to vigorous exercises at least 150 minutes weekly. She was also counseled on low-carb modified diet Benefits of healthy weight discussed Body mass index is 35.68 kg/m.        Return in about 6 months (around 03/16/2023) for CPE.   Donell Beers, FNP

## 2022-09-15 NOTE — Assessment & Plan Note (Signed)
She states that she is aware of the recommendation for LEEP due to abnormal pathology and she plans on following up with OB/GYN for this.

## 2022-09-15 NOTE — Patient Instructions (Addendum)
Please take Tylenol 650mg  every 6 hours as needed for back pain.   It is important that you exercise regularly at least 30 minutes 5 times a week as tolerated  Think about what you will eat, plan ahead. Choose " clean, green, fresh or frozen" over canned, processed or packaged foods which are more sugary, salty and fatty. 70 to 75% of food eaten should be vegetables and fruit. Three meals at set times with snacks allowed between meals, but they must be fruit or vegetables. Aim to eat over a 12 hour period , example 7 am to 7 pm, and STOP after  your last meal of the day. Drink water,generally about 64 ounces per day, no other drink is as healthy. Fruit juice is best enjoyed in a healthy way, by EATING the fruit.  Thanks for choosing [facility] we consider it a privelige to serve you.

## 2022-09-21 ENCOUNTER — Ambulatory Visit: Payer: Medicaid Other

## 2022-09-24 ENCOUNTER — Ambulatory Visit: Payer: Medicaid Other

## 2022-09-24 DIAGNOSIS — M6281 Muscle weakness (generalized): Secondary | ICD-10-CM

## 2022-09-24 DIAGNOSIS — R293 Abnormal posture: Secondary | ICD-10-CM

## 2022-09-24 DIAGNOSIS — M5459 Other low back pain: Secondary | ICD-10-CM | POA: Diagnosis not present

## 2022-09-24 NOTE — Therapy (Signed)
OUTPATIENT PHYSICAL THERAPY TREATMENT NOTE   Patient Name: Deborah Santana MRN: [id number] DOB:01-05-1986, 37 y.o., female Today's Date: 09/24/2022  PCP: none REFERRING PROVIDER: Corlis Hove, NP  END OF SESSION:   PT End of Session - 09/24/22 0847     Visit Number 4    Number of Visits 9    Date for PT Re-Evaluation 10/30/22    Authorization Type [employer]    Authorization Time Period 1/3-11/13/22    Authorization - Visit Number 1    Authorization - Number of Visits 5    PT Start Time 0846    PT Stop Time 0926    PT Time Calculation (min) 40 min    Activity Tolerance Patient tolerated treatment well    Behavior During Therapy [facility] for tasks assessed/performed              Past Medical History:  Diagnosis Date   Medical history non-contributory    Past Surgical History:  Procedure Laterality Date   COLPOSCOPY  02/26/2022   NO PAST SURGERIES     Patient Active Problem List   Diagnosis Date Noted   Chronic bilateral low back pain 09/15/2022   Obesity (BMI 35.0-39.9 without comorbidity) 09/15/2022   Normal labor 05/27/2022   Gestational hypertension 05/27/2022   Grand multipara 05/03/2022   Hx of shoulder dystocia in prior pregnancy, currently pregnant 04/01/2022   Poor compliance 03/11/2022   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 03/11/2022   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, antepartum 02/15/2022   Anti-M isoimmunization affecting pregnancy, 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   Supervision of other normal pregnancy, antepartum 12/31/2021   AMA (advanced maternal age) multigravida 35+, second trimester 12/31/2021   Anemia in pregnancy 11/16/2018   HSIL (high grade squamous intraepithelial lesion) on Pap smear of cervix 08/31/2016   Language barrier 08/27/2016    REFERRING DIAG: M54.50,G89.29 (ICD-10-CM) - Chronic bilateral low back pain without sciatica   THERAPY DIAG:  Other low back pain  Muscle weakness  (generalized)  Abnormal posture  Rationale for Evaluation and Treatment Rehabilitation  PERTINENT HISTORY:  Recent postpartum Grand multipara   PRECAUTIONS: none   SUBJECTIVE:                                                                                                                                                                                     In-person interpreter.  SUBJECTIVE STATEMENT:  "it's good. Not too much pain."    PAIN:  Are you having pain? Yes: NPRS scale: 5/10 Pain location:  back Pain description: ache Aggravating factors: unknown Relieving factors: medication   OBJECTIVE: (objective measures completed at initial evaluation unless otherwise dated)  DIAGNOSTIC FINDINGS:  N/A   PATIENT SURVEYS:  N/A language    SCREENING FOR RED FLAGS: Bowel or bladder incontinence: No Spinal tumors: No Cauda equina syndrome: No Compression fracture: No Abdominal aneurysm: No   COGNITION: Overall cognitive status: Within functional limits for tasks assessed                          SENSATION: Not tested   MUSCLE LENGTH: Hamstrings: WNL bilaterally    POSTURE: increased lumbar lordosis   PALPATION: TTP bilateral lumbar paraspinals Normal L-spine mobility    LUMBAR ROM:    AROM eval 08/23/22 09/14/22  Flexion 50% limited* 25% limited pain 25% limited tightness  Extension 50% limited*  WNL  Right lateral flexion WNL  WNL  Left lateral flexion WNL  WNL  Right rotation WNL  WNL  Left rotation WNL  WNL   (Blank rows = not tested)            *pain      LOWER EXTREMITY MMT:     MMT Right eval Left eval 09/14/22  Hip flexion 4 4 5/5 bilateral   Hip extension 4- 4- 4/5 bilateral  Hip abduction 4- 3+  4-/5 bilateral  Hip adduction       Hip internal rotation       Hip external rotation       Knee flexion       Knee extension       Ankle dorsiflexion       Ankle plantarflexion       Ankle inversion       Core 2    (Blank rows = not tested)    LUMBAR SPECIAL TESTS:  SLR (-)    FUNCTIONAL TESTS:  5 x STS: 27 seconds increased pain  09/24/22: 5 x STS: 12 seconds     GAIT: Distance walked: 10 ft  Assistive device utilized: None Level of assistance: Complete Independence Comments: slow gait speed, WBOS, limited hip flexion/extension throughout gait cycle.   [facility] Adult PT Treatment:                                                DATE: 09/24/22 Therapeutic Exercise: NuStep level 5 x 5 minutes  LTR with figure 4 x 1 min each  Sit to stand 2 x 10  Standing hip abduction 2.5# 2 x 10  Standing hip extension 2.5# 2 x 10  Standing HS curl 2.5# 2 x 10  Standing march 2.5# 2 x 10  Bodyweight squats with UE support 2 x 10  Updated HEP    [facility] Adult PT Treatment:                                                DATE: 09/14/22 Therapeutic Exercise: NuStep level 5 x 5 minutes  Stability ball rollout x 1 minute Sit to stand 2 x 10  Single knee to chest x 10 each  LTR x 1 minute  Standing hip abduction green band 2 x 10  Standing hip extension green band 2 x 10  Supine posterior pelvic tilts x 10  Supine TA march 2 x 10  Sidelying hip abduction 2 x  10  Updated HEP      [facility] Adult PT Treatment:                                                DATE: 08/23/22 Therapeutic Exercise: LTR with figure 4 x 1 minute each Sidelying thoracic rotation x 10 each  Single knee to chest x 10 each; 5 sec hold  Hip bridge 2 x 10  Supine pelvic tilts 2 x 10  Clamshells 2 x 10  SLR 2 x 10  Hooklying resisted hip abduction blue band 2 x 10  Updated HEP          PATIENT EDUCATION:  Education details: see treatment  Person educated: Patient Education method: Explanation, Demonstration, Tactile cues, Verbal cues, and Handouts Education comprehension: verbalized understanding, returned demonstration, verbal cues required, tactile cues required, and needs further education   HOME EXERCISE PROGRAM: Access Code: [email or web address] URL:  [email or web address] Date: 09/24/2022 Prepared by: Gwendolyn Grant  Exercises - Supine Lower Trunk Rotation  - 1 x daily - 7 x weekly - 1 sets - 10 reps - 5 sec hold - Supine Figure 4 Piriformis Stretch  - 1 x daily - 7 x weekly - 3 sets - 30  hold - Hooklying Single Knee to Chest Stretch  - 1 x daily - 7 x weekly - 1 sets - 10 reps - 5 sec  hold - Sidelying Thoracic Rotation with Open Book  - 1 x daily - 7 x weekly - 1 sets - 10 reps - Clamshell  - 1 x daily - 7 x weekly - 2 sets - 10 reps - Supine Active Straight Leg Raise  - 1 x daily - 7 x weekly - 2 sets - 10 reps - Hooklying Clamshell with Resistance  - 1 x daily - 7 x weekly - 2 sets - 10 reps - Standing Hip Abduction with Resistance at Ankles and Counter Support  - 1 x daily - 7 x weekly - 2 sets - 10 reps - Standing Hip Extension with Resistance at Ankles and Counter Support  - 1 x daily - 7 x weekly - 2 sets - 10 reps - Supine March with Posterior Pelvic Tilt  - 1 x daily - 7 x weekly - 2 sets - 10 reps - Standing Knee Flexion AROM with Chair Support  - 1 x daily - 7 x weekly - 2 sets - 10 reps - Standing Marching  - 1 x daily - 7 x weekly - 2 sets - 10 reps  ASSESSMENT:   CLINICAL IMPRESSION: Patient arrives with mild low back pain. Focused on progression of standing activity. She demonstrates good form with sit to stand and has significantly improved her 5 x STS, having met this LTG. She has poor standing endurance requiring frequent seated rest breaks between exercises reporting fatigue, but no increase in her pain. HEP updated to include further strengthening.    OBJECTIVE IMPAIRMENTS: Abnormal gait, decreased activity tolerance, decreased endurance, decreased knowledge of condition, difficulty walking, decreased ROM, decreased strength, increased fascial restrictions, improper body mechanics, postural dysfunction, and pain.    ACTIVITY LIMITATIONS: carrying, lifting, bending, sitting, standing, squatting,  sleeping, transfers, locomotion level, and caring for others   PARTICIPATION LIMITATIONS: meal prep, cleaning, laundry, driving, shopping, community activity, and occupation   PERSONAL FACTORS: Age, Fitness, Profession, Time since onset of  injury/illness/exacerbation, and 1-2 comorbidities: see PMH above  are also affecting patient's functional outcome.    REHAB POTENTIAL: Fair chronicity of injury   CLINICAL DECISION MAKING: Evolving/moderate complexity   EVALUATION COMPLEXITY: Moderate     GOALS: Goals reviewed with patient? Yes   SHORT TERM GOALS: Target date: 08/31/2022       Patient will be independent and compliant with initial HEP.    Baseline: issued at eval Goal status: met   2.  Patient will improve lumbar flexion and extension AROM by 25% without an increase in pain to improve ability to complete reaching and bending activity.  Baseline: see above  Goal status: met   3.  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  Baseline: no knowledge; pain with lifting Status: progressing lifting as appropriate; cues for sit to stand 09/14/22 Goal status: ongoing      LONG TERM GOALS: Target date: 09/21/2022       Patient will demonstrate at least 4+/5 bilateral hip abductor and extensor strength to improve stability about the chain with prolonged walking and standing activity.  Baseline: see above Goal status: partially met   2.  Patient will tolerate at least 20 minutes of continuous standing activity to improve her tolerance to household activities.  Baseline: 5 minutes  Status: 5 minutes of standing exercise 09/14/22 Goal status: ongoing   3.  Patient will complete 5 x STS in </= 20 seconds without an increase in pain to improve ease of transfers.  Baseline: see above  Goal status: met   4.  Patient will be independent with advanced home program to assist in management of her  chronic condition.  Baseline: n/a Goal status: ongoing   5.  Patient will report pain as </= 5/10 to reduce her current functional limitations.  Baseline: see above  Goal status: progressing       PLAN:   PT FREQUENCY: 1x/week   PT DURATION: 6 weeks   PLANNED INTERVENTIONS: Therapeutic exercises, Therapeutic activity, Neuromuscular re-education, Balance training, Gait training, Patient/Family education, Self Care, Dry Needling, Cryotherapy, Moist heat, Manual therapy, and Re-evaluation.   PLAN FOR NEXT SESSION: review and progress HEP prn; core strengthening, hip strengthening.    Letitia Libra, PT, DPT, ATC 09/24/22 9:27 AM

## 2022-10-01 ENCOUNTER — Ambulatory Visit: Payer: Medicaid Other | Admitting: Physical Therapy

## 2022-10-01 DIAGNOSIS — M6281 Muscle weakness (generalized): Secondary | ICD-10-CM

## 2022-10-01 DIAGNOSIS — M5459 Other low back pain: Secondary | ICD-10-CM | POA: Diagnosis not present

## 2022-10-01 DIAGNOSIS — R293 Abnormal posture: Secondary | ICD-10-CM

## 2022-10-01 NOTE — Therapy (Signed)
OUTPATIENT PHYSICAL THERAPY TREATMENT NOTE   Patient Name: Deborah Santana MRN: [id number] DOB:25-May-1986, 37 y.o., female Today's Date: 10/01/2022  PCP: none REFERRING PROVIDER: Johnston Ebbs, NP  END OF SESSION:   PT End of Session - 10/01/22 1108     Visit Number 5    Number of Visits 9    Date for PT Re-Evaluation 10/30/22    Authorization Type [employer]    Authorization Time Period 1/3-11/13/22    Authorization - Visit Number 2    Authorization - Number of Visits 5    PT Start Time 1105    PT Stop Time 1145    PT Time Calculation (min) 40 min              Past Medical History:  Diagnosis Date   Medical history non-contributory    Past Surgical History:  Procedure Laterality Date   COLPOSCOPY  02/26/2022   NO PAST SURGERIES     Patient Active Problem List   Diagnosis Date Noted   Chronic bilateral low back pain 09/15/2022   Obesity (BMI 35.0-39.9 without comorbidity) 09/15/2022   Normal labor 05/27/2022   Gestational hypertension 05/27/2022   Grand multipara 05/03/2022   Hx of shoulder dystocia in prior pregnancy, currently pregnant 04/01/2022   Poor compliance 03/11/2022   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 03/00/9233   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, antepartum 02/15/2022   Anti-M isoimmunization affecting pregnancy, 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   Supervision of other normal pregnancy, antepartum 12/31/2021   AMA (advanced maternal age) multigravida 35+, second trimester 12/31/2021   Anemia in pregnancy 11/16/2018   HSIL (high grade squamous intraepithelial lesion) on Pap smear of cervix 08/31/2016   Language barrier 08/27/2016    REFERRING DIAG: M54.50,G89.29 (ICD-10-CM) - Chronic bilateral low back pain without sciatica   THERAPY DIAG:  Other low back pain  Muscle weakness (generalized)  Abnormal posture  Rationale for Evaluation and Treatment Rehabilitation  PERTINENT HISTORY:  Recent postpartum Grand  multipara   PRECAUTIONS: none   SUBJECTIVE:                                                                                                                                                                                     In-person interpreter.  SUBJECTIVE STATEMENT:  "I do the exercises every evening. The back pain is improving. The pain intensity is not as high."    PAIN:  Are you having pain? Yes: NPRS scale: 6/10 Pain location:  back Pain description: ache Aggravating factors: bending Relieving factors: medication   OBJECTIVE: (objective measures completed at initial evaluation unless otherwise dated)  DIAGNOSTIC FINDINGS:  N/A  PATIENT SURVEYS:  N/A language    SCREENING FOR RED FLAGS: Bowel or bladder incontinence: No Spinal tumors: No Cauda equina syndrome: No Compression fracture: No Abdominal aneurysm: No   COGNITION: Overall cognitive status: Within functional limits for tasks assessed                          SENSATION: Not tested   MUSCLE LENGTH: Hamstrings: WNL bilaterally    POSTURE: increased lumbar lordosis   PALPATION: TTP bilateral lumbar paraspinals Normal L-spine mobility    LUMBAR ROM:    AROM eval 08/23/22 09/14/22  Flexion 50% limited* 25% limited pain 25% limited tightness  Extension 50% limited*  WNL  Right lateral flexion WNL  WNL  Left lateral flexion WNL  WNL  Right rotation WNL  WNL  Left rotation WNL  WNL   (Blank rows = not tested)            *pain      LOWER EXTREMITY MMT:     MMT Right eval Left eval 09/14/22  Hip flexion 4 4 5/5 bilateral   Hip extension 4- 4- 4/5 bilateral  Hip abduction 4- 3+  4-/5 bilateral  Hip adduction       Hip internal rotation       Hip external rotation       Knee flexion       Knee extension       Ankle dorsiflexion       Ankle plantarflexion       Ankle inversion       Core 2    (Blank rows = not tested)   LUMBAR SPECIAL TESTS:  SLR (-)    FUNCTIONAL TESTS:  5 x STS: 27  seconds increased pain  09/24/22: 5 x STS: 12 seconds     GAIT: Distance walked: 10 ft  Assistive device utilized: None Level of assistance: Complete Independence Comments: slow gait speed, WBOS, limited hip flexion/extension throughout gait cycle.   [facility] Adult PT Treatment:                                                DATE: 10/01/22 Therapeutic Exercise: Nustep L 4 UE/LE x 5 minutes Standing hip abduction 3# 2 x 10  Standing hip extension 3# 2 x 10  Standing HS curl 3# 2 x 10  Standing march 3# 2 x 10  Heel raises x 20 Gastroc stretch  Step ups x 10 each STS 10# KB 5 x 2  Open books x 10 each Supine Abdominal brace with ball 5 sec x 10- mod cues for correct technique Supine mini ab crunch-roll ball up thighs- x 10 SKTC     [facility] Adult PT Treatment:                                                DATE: 09/24/22 Therapeutic Exercise: NuStep level 5 x 5 minutes  LTR with figure 4 x 1 min each  Sit to stand 2 x 10  Standing hip abduction 2.5# 2 x 10  Standing hip extension 2.5# 2 x 10  Standing HS curl 2.5# 2 x 10  Standing march 2.5# 2 x 10  Bodyweight squats with UE support 2 x 10  Updated HEP    [facility] Adult PT Treatment:                                                DATE: 09/14/22 Therapeutic Exercise: NuStep level 5 x 5 minutes  Stability ball rollout x 1 minute Sit to stand 2 x 10  Single knee to chest x 10 each  LTR x 1 minute  Standing hip abduction green band 2 x 10  Standing hip extension green band 2 x 10  Supine posterior pelvic tilts x 10  Supine TA march 2 x 10  Sidelying hip abduction 2 x 10  Updated HEP      [facility] Adult PT Treatment:                                                DATE: 08/23/22 Therapeutic Exercise: LTR with figure 4 x 1 minute each Sidelying thoracic rotation x 10 each  Single knee to chest x 10 each; 5 sec hold  Hip bridge 2 x 10  Supine pelvic tilts 2 x 10  Clamshells 2 x 10  SLR 2 x 10  Hooklying resisted hip abduction  blue band 2 x 10  Updated HEP          PATIENT EDUCATION:  Education details: see treatment  Person educated: Patient Education method: Explanation, Demonstration, Tactile cues, Verbal cues, and Handouts Education comprehension: verbalized understanding, returned demonstration, verbal cues required, tactile cues required, and needs further education   HOME EXERCISE PROGRAM: Access Code: [email or web address] URL: [email or web address] Date: 09/24/2022 Prepared by: Gwendolyn Grant  Exercises - Supine Lower Trunk Rotation  - 1 x daily - 7 x weekly - 1 sets - 10 reps - 5 sec hold - Supine Figure 4 Piriformis Stretch  - 1 x daily - 7 x weekly - 3 sets - 30  hold - Hooklying Single Knee to Chest Stretch  - 1 x daily - 7 x weekly - 1 sets - 10 reps - 5 sec  hold - Sidelying Thoracic Rotation with Open Book  - 1 x daily - 7 x weekly - 1 sets - 10 reps - Clamshell  - 1 x daily - 7 x weekly - 2 sets - 10 reps - Supine Active Straight Leg Raise  - 1 x daily - 7 x weekly - 2 sets - 10 reps - Hooklying Clamshell with Resistance  - 1 x daily - 7 x weekly - 2 sets - 10 reps - Standing Hip Abduction with Resistance at Ankles and Counter Support  - 1 x daily - 7 x weekly - 2 sets - 10 reps - Standing Hip Extension with Resistance at Ankles and Counter Support  - 1 x daily - 7 x weekly - 2 sets - 10 reps - Supine March with Posterior Pelvic Tilt  - 1 x daily - 7 x weekly - 2 sets - 10 reps - Standing Knee Flexion AROM with Chair Support  - 1 x daily - 7 x weekly - 2 sets - 10 reps - Standing Marching  - 1 x daily - 7 x weekly - 2 sets - 10 reps  ASSESSMENT:  CLINICAL IMPRESSION: Patient arrives with mild low back pain rated at 6/10. She reports improvement in her pain intensity. She has difficulty describing what functional activities have improved. She does report increased pain with bending activities. Focused on closed chain activity with patient tolerating 15 minutes of closed chain  strengthening without rest breaks and denies increased back pain. Added 10# to STS today without increased pain. Also progressed mat based core strengthening. She denied increased pain at end of session. She is progressing toward LTGS.     OBJECTIVE IMPAIRMENTS: Abnormal gait, decreased activity tolerance, decreased endurance, decreased knowledge of condition, difficulty walking, decreased ROM, decreased strength, increased fascial restrictions, improper body mechanics, postural dysfunction, and pain.    ACTIVITY LIMITATIONS: carrying, lifting, bending, sitting, standing, squatting, sleeping, transfers, locomotion level, and caring for others   PARTICIPATION LIMITATIONS: meal prep, cleaning, laundry, driving, shopping, community activity, and occupation   PERSONAL FACTORS: Age, Fitness, Profession, Time since onset of injury/illness/exacerbation, and 1-2 comorbidities: see PMH above  are also affecting patient's functional outcome.    REHAB POTENTIAL: Fair chronicity of injury   CLINICAL DECISION MAKING: Evolving/moderate complexity   EVALUATION COMPLEXITY: Moderate     GOALS: Goals reviewed with patient? Yes   SHORT TERM GOALS: Target date: 08/31/2022       Patient will be independent and compliant with initial HEP.    Baseline: issued at eval Goal status: met   2.  Patient will improve lumbar flexion and extension AROM by 25% without an increase in pain to improve ability to complete reaching and bending activity.  Baseline: see above  Goal status: met   3.  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  Baseline: no knowledge; pain with lifting Status: progressing lifting as appropriate; cues for sit to stand 09/14/22 Goal status: ongoing      LONG TERM GOALS: Target date: 09/21/2022       Patient will demonstrate at least 4+/5 bilateral hip abductor and extensor strength to improve  stability about the chain with prolonged walking and standing activity.  Baseline: see above Goal status: partially met   2.  Patient will tolerate at least 20 minutes of continuous standing activity to improve her tolerance to household activities.  Baseline: 5 minutes  Status: 5 minutes of standing exercise 09/14/22 10/01/22: 15 minutes of standing exercise 10/01/22 Goal status: ongoing   3.  Patient will complete 5 x STS in </= 20 seconds without an increase in pain to improve ease of transfers.  Baseline: see above  Goal status: met   4.  Patient will be independent with advanced home program to assist in management of her chronic condition.  Baseline: n/a Goal status: ongoing   5.  Patient will report pain as </= 5/10 to reduce her current functional limitations.  Baseline: see above  Goal status: progressing       PLAN:   PT FREQUENCY: 1x/week   PT DURATION: 6 weeks   PLANNED INTERVENTIONS: Therapeutic exercises, Therapeutic activity, Neuromuscular re-education, Balance training, Gait training, Patient/Family education, Self Care, Dry Needling, Cryotherapy, Moist heat, Manual therapy, and Re-evaluation.   PLAN FOR NEXT SESSION: review and progress HEP prn; lifting   Jannette Spanner, PTA 10/01/22 11:48 AM Phone: [phone] Fax: [phone]

## 2022-10-04 ENCOUNTER — Ambulatory Visit: Payer: Medicaid Other

## 2022-10-04 ENCOUNTER — Other Ambulatory Visit (HOSPITAL_COMMUNITY)
Admission: RE | Admit: 2022-10-04 | Discharge: 2022-10-04 | Disposition: A | Discharge status: Home / Self Care | Payer: Medicaid Other | Source: Ambulatory Visit | Attending: Obstetrics and Gynecology | Admitting: Obstetrics and Gynecology

## 2022-10-04 ENCOUNTER — Ambulatory Visit (INDEPENDENT_AMBULATORY_CARE_PROVIDER_SITE_OTHER): Payer: Medicaid Other | Admitting: Obstetrics and Gynecology

## 2022-10-04 ENCOUNTER — Encounter: Payer: Self-pay | Admitting: Obstetrics and Gynecology

## 2022-10-04 VITALS — BP 142/80 | HR 87

## 2022-10-04 DIAGNOSIS — Z3202 Encounter for pregnancy test, result negative: Secondary | ICD-10-CM | POA: Diagnosis not present

## 2022-10-04 DIAGNOSIS — M6281 Muscle weakness (generalized): Secondary | ICD-10-CM

## 2022-10-04 DIAGNOSIS — M5459 Other low back pain: Secondary | ICD-10-CM

## 2022-10-04 DIAGNOSIS — N879 Dysplasia of cervix uteri, unspecified: Secondary | ICD-10-CM

## 2022-10-04 DIAGNOSIS — R293 Abnormal posture: Secondary | ICD-10-CM

## 2022-10-04 DIAGNOSIS — Z9889 Other specified postprocedural states: Secondary | ICD-10-CM | POA: Insufficient documentation

## 2022-10-04 DIAGNOSIS — D069 Carcinoma in situ of cervix, unspecified: Secondary | ICD-10-CM | POA: Diagnosis not present

## 2022-10-04 LAB — POCT PREGNANCY, URINE: Preg Test, Ur: NEGATIVE

## 2022-10-04 MED ORDER — IBUPROFEN 800 MG PO TABS: 800.0000 mg | ORAL_TABLET | Freq: Four times a day (QID) | ORAL | 0 refills | Status: DC

## 2022-10-04 MED ORDER — OXYCODONE HCL 5 MG PO TABS: 5.0000 mg | ORAL_TABLET | ORAL | 0 refills | Status: DC | PRN

## 2022-10-04 MED ORDER — ACETAMINOPHEN 500 MG PO TABS: 1000.0000 mg | ORAL_TABLET | Freq: Four times a day (QID) | ORAL | 0 refills | Status: DC

## 2022-10-04 NOTE — Therapy (Signed)
OUTPATIENT PHYSICAL THERAPY TREATMENT NOTE   Patient Name: Deborah Santana MRN: [id number] DOB:08-30-1986, 37 y.o., female Today's Date: 10/04/2022  PCP: none REFERRING PROVIDER: Johnston Ebbs, NP  END OF SESSION:   PT End of Session - 10/04/22 0931     Visit Number 6    Number of Visits 9    Date for PT Re-Evaluation 10/30/22    Authorization Type MCD-healthy blue    Authorization Time Period 1/3-11/13/22    Authorization - Visit Number 3    Authorization - Number of Visits 5    PT Start Time 0931    PT Stop Time 1012    PT Time Calculation (min) 41 min    Activity Tolerance Patient tolerated treatment well    Behavior During Therapy [facility] for tasks assessed/performed               Past Medical History:  Diagnosis Date   Medical history non-contributory    Past Surgical History:  Procedure Laterality Date   COLPOSCOPY  02/26/2022   NO PAST SURGERIES     Patient Active Problem List   Diagnosis Date Noted   Chronic bilateral low back pain 09/15/2022   Obesity (BMI 35.0-39.9 without comorbidity) 09/15/2022   Normal labor 05/27/2022   Gestational hypertension 05/27/2022   Grand multipara 05/03/2022   Hx of shoulder dystocia in prior pregnancy, currently pregnant 04/01/2022   Poor compliance 03/11/2022   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 56/21/3086   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, antepartum 02/15/2022   Anti-M isoimmunization affecting pregnancy, 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   Supervision of other normal pregnancy, antepartum 12/31/2021   AMA (advanced maternal age) multigravida 35+, second trimester 12/31/2021   Anemia in pregnancy 11/16/2018   HSIL (high grade squamous intraepithelial lesion) on Pap smear of cervix 08/31/2016   Language barrier 08/27/2016    REFERRING DIAG: M54.50,G89.29 (ICD-10-CM) - Chronic bilateral low back pain without sciatica   THERAPY DIAG:  Other low back pain  Muscle weakness  (generalized)  Abnormal posture  Rationale for Evaluation and Treatment Rehabilitation  PERTINENT HISTORY:  Recent postpartum Grand multipara   PRECAUTIONS: none   SUBJECTIVE:                                                                                                                                                                                     In-person interpreter.  SUBJECTIVE STATEMENT:  "It is not bad today."   PAIN:  Are you having pain? Yes: NPRS scale: 6/10 Pain location:  back Pain description: ache Aggravating factors: bending Relieving factors: medication   OBJECTIVE: (objective measures completed at initial evaluation unless otherwise dated)  DIAGNOSTIC FINDINGS:  N/A   PATIENT SURVEYS:  N/A language    SCREENING FOR RED FLAGS: Bowel or bladder incontinence: No Spinal tumors: No Cauda equina syndrome: No Compression fracture: No Abdominal aneurysm: No   COGNITION: Overall cognitive status: Within functional limits for tasks assessed                          SENSATION: Not tested   MUSCLE LENGTH: Hamstrings: WNL bilaterally    POSTURE: increased lumbar lordosis   PALPATION: TTP bilateral lumbar paraspinals Normal L-spine mobility    LUMBAR ROM:    AROM eval 08/23/22 09/14/22  Flexion 50% limited* 25% limited pain 25% limited tightness  Extension 50% limited*  WNL  Right lateral flexion WNL  WNL  Left lateral flexion WNL  WNL  Right rotation WNL  WNL  Left rotation WNL  WNL   (Blank rows = not tested)            *pain      LOWER EXTREMITY MMT:     MMT Right eval Left eval 09/14/22  Hip flexion 4 4 5/5 bilateral   Hip extension 4- 4- 4/5 bilateral  Hip abduction 4- 3+  4-/5 bilateral  Hip adduction       Hip internal rotation       Hip external rotation       Knee flexion       Knee extension       Ankle dorsiflexion       Ankle plantarflexion       Ankle inversion       Core 2    (Blank rows = not tested)   LUMBAR  SPECIAL TESTS:  SLR (-)    FUNCTIONAL TESTS:  5 x STS: 27 seconds increased pain  09/24/22: 5 x STS: 12 seconds     GAIT: Distance walked: 10 ft  Assistive device utilized: None Level of assistance: Complete Independence Comments: slow gait speed, WBOS, limited hip flexion/extension throughout gait cycle.  [facility] Adult PT Treatment:                                                DATE: 10/04/22 Therapeutic Exercise: Treadmill level 1.2 x 5 minutes  LTR with figure 4 x 1 min each  Hip cybex flexion 25 lbs 2 x 10  Hip cybex abduction 25 lbs 2 x 10  Hip cybex extension 25 lbs 2 x 10  Step ups with 5 lb kettle bells aerobic stepper x 1 block 2 x 10  Squats to step 5 lb kettle bell 2 x 10  Leg press 2 x 10 @ 40 lbs    [facility] Adult PT Treatment:                                                DATE: 10/01/22 Therapeutic Exercise: Nustep L 4 UE/LE x 5 minutes Standing hip abduction 3# 2 x 10  Standing hip extension 3# 2 x 10  Standing HS curl 3# 2 x 10  Standing march 3# 2 x 10  Heel raises x 20 Gastroc stretch  Step ups x 10 each STS 10# KB 5 x 2  Open  books x 10 each Supine Abdominal brace with ball 5 sec x 10- mod cues for correct technique Supine mini ab crunch-roll ball up thighs- x 10 SKTC     [facility] Adult PT Treatment:                                                DATE: 09/24/22 Therapeutic Exercise: NuStep level 5 x 5 minutes  LTR with figure 4 x 1 min each  Sit to stand 2 x 10  Standing hip abduction 2.5# 2 x 10  Standing hip extension 2.5# 2 x 10  Standing HS curl 2.5# 2 x 10  Standing march 2.5# 2 x 10  Bodyweight squats with UE support 2 x 10  Updated HEP           PATIENT EDUCATION:  Education details: HEP review Person educated: Patient Education method: Explanation,  Education comprehension: verbalized understanding   HOME EXERCISE PROGRAM: Access Code: [email or web address] URL: [email or web address] Date: 09/24/2022 Prepared by: Gwendolyn Grant  Exercises - Supine Lower Trunk Rotation  - 1 x daily - 7 x weekly - 1 sets - 10 reps - 5 sec hold - Supine Figure 4 Piriformis Stretch  - 1 x daily - 7 x weekly - 3 sets - 30  hold - Hooklying Single Knee to Chest Stretch  - 1 x daily - 7 x weekly - 1 sets - 10 reps - 5 sec  hold - Sidelying Thoracic Rotation with Open Book  - 1 x daily - 7 x weekly - 1 sets - 10 reps - Clamshell  - 1 x daily - 7 x weekly - 2 sets - 10 reps - Supine Active Straight Leg Raise  - 1 x daily - 7 x weekly - 2 sets - 10 reps - Hooklying Clamshell with Resistance  - 1 x daily - 7 x weekly - 2 sets - 10 reps - Standing Hip Abduction with Resistance at Ankles and Counter Support  - 1 x daily - 7 x weekly - 2 sets - 10 reps - Standing Hip Extension with Resistance at Ankles and Counter Support  - 1 x daily - 7 x weekly - 2 sets - 10 reps - Supine March with Posterior Pelvic Tilt  - 1 x daily - 7 x weekly - 2 sets - 10 reps - Standing Knee Flexion AROM with Chair Support  - 1 x daily - 7 x weekly - 2 sets - 10 reps - Standing Marching  - 1 x daily - 7 x weekly - 2 sets - 10 reps  ASSESSMENT:   CLINICAL IMPRESSION: Patient reports that her back pain "is not bad," continuing to rate it as a 6/10. Focused on progression of standing strengthening and introduced walking on the treadmill today with good tolerance. No increase in pain following 5 minutes of treadmill walking. Able to complete machine strengthening with patient demonstrating good lumbopelvic stability and no increase in pain. She reported fatigue after 15 minutes of continuous standing activity, but no increase in back pain. She was able to tolerate >20 minutes of standing exercises without an increase in pain, having met the LTG. She requires initial cues for proper squat form that she is able to correct with continued reps.     OBJECTIVE IMPAIRMENTS: Abnormal gait, decreased activity tolerance, decreased endurance, decreased knowledge of condition,  difficulty walking, decreased ROM, decreased strength, increased fascial restrictions, improper body mechanics, postural dysfunction, and pain.    ACTIVITY LIMITATIONS: carrying, lifting, bending, sitting, standing, squatting, sleeping, transfers, locomotion level, and caring for others   PARTICIPATION LIMITATIONS: meal prep, cleaning, laundry, driving, shopping, community activity, and occupation   PERSONAL FACTORS: Age, Fitness, Profession, Time since onset of injury/illness/exacerbation, and 1-2 comorbidities: see PMH above  are also affecting patient's functional outcome.    REHAB POTENTIAL: Fair chronicity of injury   CLINICAL DECISION MAKING: Evolving/moderate complexity   EVALUATION COMPLEXITY: Moderate     GOALS: Goals reviewed with patient? Yes   SHORT TERM GOALS: Target date: 08/31/2022       Patient will be independent and compliant with initial HEP.    Baseline: issued at eval Goal status: met   2.  Patient will improve lumbar flexion and extension AROM by 25% without an increase in pain to improve ability to complete reaching and bending activity.  Baseline: see above  Goal status: met   3.  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  Baseline: no knowledge; pain with lifting Status: progressing lifting as appropriate; cues for sit to stand 09/14/22 Goal status: ongoing      LONG TERM GOALS: Target date: 09/21/2022       Patient will demonstrate at least 4+/5 bilateral hip abductor and extensor strength to improve stability about the chain with prolonged walking and standing activity.  Baseline: see above Goal status: partially met   2.  Patient will tolerate at least 20 minutes of continuous standing activity to improve her tolerance to household activities.  Baseline: 5 minutes  Status: 5 minutes of standing exercise 09/14/22 10/01/22: 15 minutes of standing exercise  10/01/22 10/04/22: > 20 minutes of standing exercise 10/04/22 Goal status: met    3.  Patient will complete 5 x STS in </= 20 seconds without an increase in pain to improve ease of transfers.  Baseline: see above  Goal status: met   4.  Patient will be independent with advanced home program to assist in management of her chronic condition.  Baseline: n/a Goal status: ongoing   5.  Patient will report pain as </= 5/10 to reduce her current functional limitations.  Baseline: see above  Goal status: progressing       PLAN:   PT FREQUENCY: 1x/week   PT DURATION: 6 weeks   PLANNED INTERVENTIONS: Therapeutic exercises, Therapeutic activity, Neuromuscular re-education, Balance training, Gait training, Patient/Family education, Self Care, Dry Needling, Cryotherapy, Moist heat, Manual therapy, and Re-evaluation.   PLAN FOR NEXT SESSION: review and progress HEP prn; lifting   Gwendolyn Grant, PT, DPT, ATC 10/04/22 10:13 AM

## 2022-10-04 NOTE — Progress Notes (Signed)
   GYNECOLOGY OFFICE PROCEDURE NOTE  Deborah Santana is a 37 y.o. [id number] here for LEEP. Pap smear and colposcopy history reviewed.   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  Role of HPV,cervical dysplasia and need for close followup was empasized. Informed written consent was obtained. All questions were answered. Time out performed. Urine pregnancy test was negative.  ??Procedure: The patient was placed in lithotomy position and the bivalved coated speculum was placed in the patient's vagina. A grounding pad placed on the patient.    Local anesthesia was administered via an paraacervical and intracervical block using 15 ml of 2% Lidocaine with epinephrine. The suction was turned on and the Medium Fisher Cone Biopsy Excisor on 60 Watts of blended current was used to excise the entire transformation zone. Moderate hemostasis was achieved using roller ball coagulation set at 56 Watts coagulation current. Monsel's solution was then applied and the speculum was removed from the vagina. Specimens were sent to pathology.  ?The patient had significant pain with procedure, limiting use of roller ball. Monsels applied generally and given a few narcotics for postprocedure pain control in addition to instructions to use NSAIDs/APAP.  Post-operative instructions given to patient, including instruction to seek medical attention for persistent bright red bleeding, fever, abdominal/pelvic pain, dysuria, nausea or vomiting. She was also told about the possibility of having copious yellow to black tinged discharge for weeks. She was counseled to  avoid anything in the vagina (sex/douching/tampons) for 4 weeks. She has a 4 week post-operative check to assess wound healing, review results and discuss further management.    Darliss Cheney, MD [employer], [facility] for [employer], [place]

## 2022-10-04 NOTE — Patient Instructions (Signed)
Take next dose of ibuprofen at 8:30pm tonight

## 2022-10-06 LAB — SURGICAL PATHOLOGY

## 2022-10-11 ENCOUNTER — Ambulatory Visit: Payer: Medicaid Other

## 2022-10-11 DIAGNOSIS — M6281 Muscle weakness (generalized): Secondary | ICD-10-CM

## 2022-10-11 DIAGNOSIS — M5459 Other low back pain: Secondary | ICD-10-CM | POA: Diagnosis not present

## 2022-10-11 DIAGNOSIS — R293 Abnormal posture: Secondary | ICD-10-CM

## 2022-10-11 NOTE — Therapy (Signed)
OUTPATIENT PHYSICAL THERAPY TREATMENT NOTE   Patient Name: Deborah Santana MRN: [id number] DOB:[birth year], 37 y.o., female Today's Date: 10/11/2022  PCP: none REFERRING PROVIDER: Johnston Ebbs, NP  END OF SESSION:   PT End of Session - 10/11/22 0930     Visit Number 7    Number of Visits 9    Date for PT Re-Evaluation 10/30/22    Authorization Type MCD-healthy blue    Authorization Time Period 1/3-11/13/22    Authorization - Visit Number 4    Authorization - Number of Visits 5    PT Start Time 0930    PT Stop Time 1012    PT Time Calculation (min) 42 min    Activity Tolerance Patient tolerated treatment well    Behavior During Therapy [facility] for tasks assessed/performed                Past Medical History:  Diagnosis Date   Medical history non-contributory    Past Surgical History:  Procedure Laterality Date   COLPOSCOPY  02/26/2022   NO PAST SURGERIES     Patient Active Problem List   Diagnosis Date Noted   Chronic bilateral low back pain 09/15/2022   Obesity (BMI 35.0-39.9 without comorbidity) 09/15/2022   Normal labor 05/27/2022   Gestational hypertension 05/27/2022   Grand multipara 05/03/2022   Hx of shoulder dystocia in prior pregnancy, currently pregnant 04/01/2022   Poor compliance 03/11/2022   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 16/60/6301   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, antepartum 02/15/2022   Anti-M isoimmunization affecting pregnancy, 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   Supervision of other normal pregnancy, antepartum 12/31/2021   AMA (advanced maternal age) multigravida 35+, second trimester 12/31/2021   Anemia in pregnancy 11/16/2018   HSIL (high grade squamous intraepithelial lesion) on Pap smear of cervix 08/31/2016   Language barrier 08/27/2016    REFERRING DIAG: M54.50,G89.29 (ICD-10-CM) - Chronic bilateral low back pain without sciatica   THERAPY DIAG:  Other low back pain  Muscle weakness  (generalized)  Abnormal posture  Rationale for Evaluation and Treatment Rehabilitation  PERTINENT HISTORY:  Recent postpartum Grand multipara   PRECAUTIONS: none   SUBJECTIVE:                                                                                                                                                                                     In-person interpreter.  SUBJECTIVE STATEMENT:  "It's a little bit of pain."   PAIN:  Are you having pain? Yes: NPRS scale: 5/10 Pain location:  back Pain description: ache Aggravating factors: bending Relieving factors: medication   OBJECTIVE: (objective measures completed at initial evaluation unless otherwise  dated)  DIAGNOSTIC FINDINGS:  N/A   PATIENT SURVEYS:  N/A language    SCREENING FOR RED FLAGS: Bowel or bladder incontinence: No Spinal tumors: No Cauda equina syndrome: No Compression fracture: No Abdominal aneurysm: No   COGNITION: Overall cognitive status: Within functional limits for tasks assessed                          SENSATION: Not tested   MUSCLE LENGTH: Hamstrings: WNL bilaterally    POSTURE: increased lumbar lordosis   PALPATION: TTP bilateral lumbar paraspinals Normal L-spine mobility    LUMBAR ROM:    AROM eval 08/23/22 09/14/22  Flexion 50% limited* 25% limited pain 25% limited tightness  Extension 50% limited*  WNL  Right lateral flexion WNL  WNL  Left lateral flexion WNL  WNL  Right rotation WNL  WNL  Left rotation WNL  WNL   (Blank rows = not tested)            *pain      LOWER EXTREMITY MMT:     MMT Right eval Left eval 09/14/22 10/11/22  Hip flexion 4 4 5/5 bilateral    Hip extension 4- 4- 4/5 bilateral   Hip abduction 4- 3+  4-/5 bilateral Rt: 5; Lt: 5   Hip adduction        Hip internal rotation        Hip external rotation        Knee flexion        Knee extension        Ankle dorsiflexion        Ankle plantarflexion        Ankle inversion        Core 2      (Blank rows = not tested)   LUMBAR SPECIAL TESTS:  SLR (-)    FUNCTIONAL TESTS:  5 x STS: 27 seconds increased pain  09/24/22: 5 x STS: 12 seconds     GAIT: Distance walked: 10 ft  Assistive device utilized: None Level of assistance: Complete Independence Comments: slow gait speed, WBOS, limited hip flexion/extension throughout gait cycle.  [facility] Adult PT Treatment:                                                DATE: 10/11/22 Therapeutic Exercise: Treadmill level 1.5 x 5 minutes  LTR with figure 4 x 1 min each  Seated hip hinge with dowel x 10  Standing hip hinge with dowel 2 x 10  Figure 4 bridge 2 x 10  90/90 toe taps 2 x 10  Quadruped leg extension 2 x 10  Sideplank 2 x 20 sec  Updated HEP     [facility] Adult PT Treatment:                                                DATE: 10/04/22 Therapeutic Exercise: Treadmill level 1.2 x 5 minutes  LTR with figure 4 x 1 min each  Hip cybex flexion 25 lbs 2 x 10  Hip cybex abduction 25 lbs 2 x 10  Hip cybex extension 25 lbs 2 x 10  Step ups with 5 lb kettle bells aerobic stepper x 1  block 2 x 10  Squats to step 5 lb kettle bell 2 x 10  Leg press 2 x 10 @ 40 lbs    [facility] Adult PT Treatment:                                                DATE: 10/01/22 Therapeutic Exercise: Nustep L 4 UE/LE x 5 minutes Standing hip abduction 3# 2 x 10  Standing hip extension 3# 2 x 10  Standing HS curl 3# 2 x 10  Standing march 3# 2 x 10  Heel raises x 20 Gastroc stretch  Step ups x 10 each STS 10# KB 5 x 2  Open books x 10 each Supine Abdominal brace with ball 5 sec x 10- mod cues for correct technique Supine mini ab crunch-roll ball up thighs- x 10 SKTC       PATIENT EDUCATION:  Education details: HEP update Person educated: Patient Education method: Explanation, demo, cues, handout Education comprehension: verbalized understanding, returned demo, cues    HOME EXERCISE PROGRAM: Access Code: [email or web address] URL:  [email or web address] Date: 09/24/2022 Prepared by: Letitia Libra  Exercises - Supine Lower Trunk Rotation  - 1 x daily - 7 x weekly - 1 sets - 10 reps - 5 sec hold - Supine Figure 4 Piriformis Stretch  - 1 x daily - 7 x weekly - 3 sets - 30  hold - Hooklying Single Knee to Chest Stretch  - 1 x daily - 7 x weekly - 1 sets - 10 reps - 5 sec  hold - Sidelying Thoracic Rotation with Open Book  - 1 x daily - 7 x weekly - 1 sets - 10 reps - Clamshell  - 1 x daily - 7 x weekly - 2 sets - 10 reps - Supine Active Straight Leg Raise  - 1 x daily - 7 x weekly - 2 sets - 10 reps - Hooklying Clamshell with Resistance  - 1 x daily - 7 x weekly - 2 sets - 10 reps - Standing Hip Abduction with Resistance at Ankles and Counter Support  - 1 x daily - 7 x weekly - 2 sets - 10 reps - Standing Hip Extension with Resistance at Ankles and Counter Support  - 1 x daily - 7 x weekly - 2 sets - 10 reps - Supine March with Posterior Pelvic Tilt  - 1 x daily - 7 x weekly - 2 sets - 10 reps - Standing Knee Flexion AROM with Chair Support  - 1 x daily - 7 x weekly - 2 sets - 10 reps - Standing Marching  - 1 x daily - 7 x weekly - 2 sets - 10 reps  ASSESSMENT:   CLINICAL IMPRESSION: Patient tolerated session well today focusing on progression of lifting activity and dynamic core stabilization. She demonstrates proper hip hinge in sitting, but has difficulty sequencing hip hinge in standing and allowing for proper gluteal activation with ascent. She reports low back pain on the ascent, but when she is better able to activate her glutes and core she reports less pain. She has difficulty maintaining TA activation with progression of dynamic core stabilization having tendency to revert to anterior pelvic tilt. She reported fatigue at conclusion of session, but no increase in her pain.     OBJECTIVE IMPAIRMENTS: Abnormal gait, decreased activity tolerance, decreased endurance, decreased  knowledge of condition,  difficulty walking, decreased ROM, decreased strength, increased fascial restrictions, improper body mechanics, postural dysfunction, and pain.    ACTIVITY LIMITATIONS: carrying, lifting, bending, sitting, standing, squatting, sleeping, transfers, locomotion level, and caring for others   PARTICIPATION LIMITATIONS: meal prep, cleaning, laundry, driving, shopping, community activity, and occupation   PERSONAL FACTORS: Age, Fitness, Profession, Time since onset of injury/illness/exacerbation, and 1-2 comorbidities: see PMH above  are also affecting patient's functional outcome.    REHAB POTENTIAL: Fair chronicity of injury   CLINICAL DECISION MAKING: Evolving/moderate complexity   EVALUATION COMPLEXITY: Moderate     GOALS: Goals reviewed with patient? Yes   SHORT TERM GOALS: Target date: 08/31/2022       Patient will be independent and compliant with initial HEP.    Baseline: issued at eval Goal status: met   2.  Patient will improve lumbar flexion and extension AROM by 25% without an increase in pain to improve ability to complete reaching and bending activity.  Baseline: see above  Goal status: met   3.  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  Baseline: no knowledge; pain with lifting Status: progressing lifting as appropriate; cues for sit to stand 09/14/22 Goal status: ongoing      LONG TERM GOALS: Target date: 09/21/2022       Patient will demonstrate at least 4+/5 bilateral hip abductor and extensor strength to improve stability about the chain with prolonged walking and standing activity.  Baseline: see above Goal status: partially met   2.  Patient will tolerate at least 20 minutes of continuous standing activity to improve her tolerance to household activities.  Baseline: 5 minutes  Status: 5 minutes of standing exercise 09/14/22 10/01/22: 15 minutes of standing exercise  10/01/22 10/04/22: > 20 minutes of standing exercise 10/04/22 Goal status: met    3.  Patient will complete 5 x STS in </= 20 seconds without an increase in pain to improve ease of transfers.  Baseline: see above  Goal status: met   4.  Patient will be independent with advanced home program to assist in management of her chronic condition.  Baseline: n/a Goal status: ongoing   5.  Patient will report pain as </= 5/10 to reduce her current functional limitations.  Baseline: see above  Goal status: progressing       PLAN:   PT FREQUENCY: 1x/week   PT DURATION: 6 weeks   PLANNED INTERVENTIONS: Therapeutic exercises, Therapeutic activity, Neuromuscular re-education, Balance training, Gait training, Patient/Family education, Self Care, Dry Needling, Cryotherapy, Moist heat, Manual therapy, and Re-evaluation.   PLAN FOR NEXT SESSION: review and progress HEP prn; lifting   Letitia Libra, PT, DPT, ATC 10/11/22 10:12 AM

## 2022-10-18 ENCOUNTER — Ambulatory Visit: Payer: Medicaid Other | Attending: Student

## 2022-10-18 DIAGNOSIS — R293 Abnormal posture: Secondary | ICD-10-CM

## 2022-10-18 DIAGNOSIS — M5459 Other low back pain: Secondary | ICD-10-CM | POA: Insufficient documentation

## 2022-10-18 DIAGNOSIS — M6281 Muscle weakness (generalized): Secondary | ICD-10-CM | POA: Insufficient documentation

## 2022-10-18 NOTE — Patient Instructions (Signed)

## 2022-10-18 NOTE — Therapy (Signed)
OUTPATIENT PHYSICAL THERAPY TREATMENT NOTE  PHYSICAL THERAPY DISCHARGE SUMMARY  Visits from Start of Care: 8  Current functional level related to goals / functional outcomes: All goals met   Remaining deficits: Intermittent low back pain   Education / Equipment: See education below    Patient agrees to discharge. Patient goals were met. Patient is being discharged due to meeting the stated rehab goals.  Patient Name: Deborah Santana MRN: [id number] DOB:09/09/1986, 37 y.o., female Today's Date: 10/18/2022  PCP: none REFERRING PROVIDER: Corlis Hove, NP  END OF SESSION:   PT End of Session - 10/18/22 0931     Visit Number 8    Number of Visits 9    Date for PT Re-Evaluation 10/30/22    Authorization Type [employer]    Authorization Time Period 1/3-11/13/22    Authorization - Visit Number 5    Authorization - Number of Visits 5    PT Start Time 0931    PT Stop Time 1013    PT Time Calculation (min) 42 min    Activity Tolerance Patient tolerated treatment well    Behavior During Therapy [facility] for tasks assessed/performed                 Past Medical History:  Diagnosis Date   Medical history non-contributory    Past Surgical History:  Procedure Laterality Date   COLPOSCOPY  02/26/2022   NO PAST SURGERIES     Patient Active Problem List   Diagnosis Date Noted   Chronic bilateral low back pain 09/15/2022   Obesity (BMI 35.0-39.9 without comorbidity) 09/15/2022   Normal labor 05/27/2022   Gestational hypertension 05/27/2022   Grand multipara 05/03/2022   Hx of shoulder dystocia in prior pregnancy, currently pregnant 04/01/2022   Poor compliance 03/11/2022   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 03/11/2022   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, antepartum 02/15/2022   Anti-M isoimmunization affecting pregnancy, 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   Supervision of other normal pregnancy, antepartum 12/31/2021   AMA (advanced maternal  age) multigravida 35+, second trimester 12/31/2021   Anemia in pregnancy 11/16/2018   HSIL (high grade squamous intraepithelial lesion) on Pap smear of cervix 08/31/2016   Language barrier 08/27/2016    REFERRING DIAG: M54.50,G89.29 (ICD-10-CM) - Chronic bilateral low back pain without sciatica   THERAPY DIAG:  Other low back pain  Muscle weakness (generalized)  Abnormal posture  Rationale for Evaluation and Treatment Rehabilitation  PERTINENT HISTORY:  Recent postpartum Grand multipara   PRECAUTIONS: none   SUBJECTIVE:  In-person interpreter.  SUBJECTIVE STATEMENT:  Patient reports she is having a little bit of pain. She feels that her back is much better since starting PT. She continues to have pain along the Rt-side of low back, but is unsure of aggravating factors at this time and feels that her exercises help with her pain.    PAIN:  Are you having pain? Yes: NPRS scale: 5/10 Pain location:  back Pain description: ache Aggravating factors: unknown Relieving factors: medication   OBJECTIVE: (objective measures completed at initial evaluation unless otherwise dated)  DIAGNOSTIC FINDINGS:  N/A   PATIENT SURVEYS:  N/A language    SCREENING FOR RED FLAGS: Bowel or bladder incontinence: No Spinal tumors: No Cauda equina syndrome: No Compression fracture: No Abdominal aneurysm: No   COGNITION: Overall cognitive status: Within functional limits for tasks assessed                          SENSATION: Not tested   MUSCLE LENGTH: Hamstrings: WNL bilaterally    POSTURE: increased lumbar lordosis   PALPATION: TTP bilateral lumbar paraspinals Normal L-spine mobility    LUMBAR ROM:    AROM eval 08/23/22 09/14/22 10/18/22  Flexion 50% limited* 25% limited pain 25% limited tightness Full    Extension 50% limited*  WNL Full   Right lateral flexion WNL  WNL WNL  Left lateral flexion WNL  WNL WNL  Right rotation WNL  WNL WNL  Left rotation WNL  WNL WNL   (Blank rows = not tested)            *pain      LOWER EXTREMITY MMT:     MMT Right eval Left eval 09/14/22 10/11/22 10/18/22  Hip flexion 4 4 5/5 bilateral   5/5 bilateral  Hip extension 4- 4- 4/5 bilateral  5/5 bilateral   Hip abduction 4- 3+  4-/5 bilateral Rt: 5; Lt: 5  5/5 bilateral   Hip adduction         Hip internal rotation         Hip external rotation         Knee flexion         Knee extension         Ankle dorsiflexion         Ankle plantarflexion         Ankle inversion         Core 2      (Blank rows = not tested)   LUMBAR SPECIAL TESTS:  SLR (-)    FUNCTIONAL TESTS:  5 x STS: 27 seconds increased pain  09/24/22: 5 x STS: 12 seconds     GAIT: Distance walked: 10 ft  Assistive device utilized: None Level of assistance: Complete Independence Comments: slow gait speed, WBOS, limited hip flexion/extension throughout gait cycle.  [facility] Adult PT Treatment:                                                DATE: 10/18/22 Therapeutic Exercise: NuStep level 5 x 5 minutes  Hooklying hip abduction black band x 10 Standing hip abduction blue band x 10 each Standing hip extension blue band x 10 each  Figure 4 bridge x 5 each 90/90 toe taps x 10 Quadruped leg extension x 5 Reviewed HEP discussing frequency, sets, reps and ways  to progress independently.  Therapeutic Activity: Re-assessment to determine overall progress; educating patient on progress towards goals.   Self Care: Education on sleep positioning, posture, and lifting mechanics with handout provided.    [facility] Adult PT Treatment:                                                DATE: 10/11/22 Therapeutic Exercise: Treadmill level 1.5 x 5 minutes  LTR with figure 4 x 1 min each  Seated hip hinge with dowel x 10  Standing hip hinge with dowel 2 x  10  Figure 4 bridge 2 x 10  90/90 toe taps 2 x 10  Quadruped leg extension 2 x 10  Sideplank 2 x 20 sec  Updated HEP     [facility] Adult PT Treatment:                                                DATE: 10/04/22 Therapeutic Exercise: Treadmill level 1.2 x 5 minutes  LTR with figure 4 x 1 min each  Hip cybex flexion 25 lbs 2 x 10  Hip cybex abduction 25 lbs 2 x 10  Hip cybex extension 25 lbs 2 x 10  Step ups with 5 lb kettle bells aerobic stepper x 1 block 2 x 10  Squats to step 5 lb kettle bell 2 x 10  Leg press 2 x 10 @ 40 lbs    PATIENT EDUCATION:  Education details: see treatment; D/C education  Person educated: Patient Education method: Explanation, demo, cues, handout Education comprehension: verbalized understanding, returned demo, cues    HOME EXERCISE PROGRAM: Access Code: [email or web address] URL: [email or web address] Date: 09/24/2022 Prepared by: Gwendolyn Grant  Exercises - Supine Lower Trunk Rotation  - 1 x daily - 7 x weekly - 1 sets - 10 reps - 5 sec hold - Supine Figure 4 Piriformis Stretch  - 1 x daily - 7 x weekly - 3 sets - 30  hold - Hooklying Single Knee to Chest Stretch  - 1 x daily - 7 x weekly - 1 sets - 10 reps - 5 sec  hold - Sidelying Thoracic Rotation with Open Book  - 1 x daily - 7 x weekly - 1 sets - 10 reps - Clamshell  - 1 x daily - 7 x weekly - 2 sets - 10 reps - Supine Active Straight Leg Raise  - 1 x daily - 7 x weekly - 2 sets - 10 reps - Hooklying Clamshell with Resistance  - 1 x daily - 7 x weekly - 2 sets - 10 reps - Standing Hip Abduction with Resistance at Ankles and Counter Support  - 1 x daily - 7 x weekly - 2 sets - 10 reps - Standing Hip Extension with Resistance at Ankles and Counter Support  - 1 x daily - 7 x weekly - 2 sets - 10 reps - Supine March with Posterior Pelvic Tilt  - 1 x daily - 7 x weekly - 2 sets - 10 reps - Standing Knee Flexion AROM with Chair Support  - 1 x daily - 7 x weekly - 2 sets - 10 reps - Standing Marching   - 1 x daily - 7 x  weekly - 2 sets - 10 reps  ASSESSMENT:   CLINICAL IMPRESSION: Patient has progressed well throughout duration of care, having met all established functional goals. She demonstrates full and pain free lumbar AROM, full hip strength, and proper lifting mechanics without onset of back pain. She is independent with advanced home program and is appropriate for discharge at this time with patient in agreement with this plan.     OBJECTIVE IMPAIRMENTS: Abnormal gait, decreased activity tolerance, decreased endurance, decreased knowledge of condition, difficulty walking, decreased ROM, decreased strength, increased fascial restrictions, improper body mechanics, postural dysfunction, and pain.    ACTIVITY LIMITATIONS: carrying, lifting, bending, sitting, standing, squatting, sleeping, transfers, locomotion level, and caring for others   PARTICIPATION LIMITATIONS: meal prep, cleaning, laundry, driving, shopping, community activity, and occupation   PERSONAL FACTORS: Age, Fitness, Profession, Time since onset of injury/illness/exacerbation, and 1-2 comorbidities: see PMH above  are also affecting patient's functional outcome.    REHAB POTENTIAL: Fair chronicity of injury   CLINICAL DECISION MAKING: Evolving/moderate complexity   EVALUATION COMPLEXITY: Moderate     GOALS: Goals reviewed with patient? Yes   SHORT TERM GOALS: Target date: 08/31/2022       Patient will be independent and compliant with initial HEP.    Baseline: issued at eval Goal status: met   2.  Patient will improve lumbar flexion and extension AROM by 25% without an increase in pain to improve ability to complete reaching and bending activity.  Baseline: see above  Goal status: met   3.  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  Baseline: no knowledge; pain with lifting Status: progressing lifting as  appropriate; cues for sit to stand 09/14/22 Status: proper lifting of 5 lbs 10/18/22 Goal status: met      LONG TERM GOALS: Target date: 09/21/2022       Patient will demonstrate at least 4+/5 bilateral hip abductor and extensor strength to improve stability about the chain with prolonged walking and standing activity.  Baseline: see above Goal status: met   2.  Patient will tolerate at least 20 minutes of continuous standing activity to improve her tolerance to household activities.  Baseline: 5 minutes  Status: 5 minutes of standing exercise 09/14/22 10/01/22: 15 minutes of standing exercise 10/01/22 10/04/22: > 20 minutes of standing exercise 10/04/22 Goal status: met    3.  Patient will complete 5 x STS in </= 20 seconds without an increase in pain to improve ease of transfers.  Baseline: see above  Goal status: met   4.  Patient will be independent with advanced home program to assist in management of her chronic condition.  Baseline: n/a Goal status: met   5.  Patient will report pain as </= 5/10 to reduce her current functional limitations.  Baseline: see above  Goal status: met       PLAN:   PT FREQUENCY: n/a   PT DURATION: n/a   PLANNED INTERVENTIONS: Therapeutic exercises, Therapeutic activity, Neuromuscular re-education, Balance training, Gait training, Patient/Family education, Self Care, Dry Needling, Cryotherapy, Moist heat, Manual therapy, and Re-evaluation.   PLAN FOR NEXT SESSION: n/a   Gwendolyn Grant, PT, DPT, ATC 10/18/22 10:39 AM

## 2022-10-25 ENCOUNTER — Ambulatory Visit: Payer: Medicaid Other

## 2022-11-02 ENCOUNTER — Ambulatory Visit: Payer: Medicaid Other | Admitting: Obstetrics and Gynecology

## 2022-11-03 ENCOUNTER — Telehealth: Payer: Self-pay

## 2022-11-03 NOTE — Telephone Encounter (Signed)
Called patient using [employer] [phone]. Left patient I message that I was calling to check on her since she had her procedure a month ago. I asked patient to call the office back or we would try to call back at a later time.

## 2022-11-03 NOTE — Telephone Encounter (Signed)
-----   Message from Darliss Cheney, MD sent at 11/02/2022 10:55 AM EST ----- Regarding: contact Hello,  Can someone contact this patient and see how she is doing? She had a LEEP about 4 weeks ago and had a lot of pain. Make sure she isn't in a bunch of pain nor having signs of infection. Also let her know she will need a pap in 6 months. Thanks,  C Ajewole

## 2022-11-04 ENCOUNTER — Encounter: Payer: Self-pay | Admitting: Family Medicine

## 2022-11-04 ENCOUNTER — Other Ambulatory Visit (HOSPITAL_COMMUNITY)
Admission: RE | Admit: 2022-11-04 | Discharge: 2022-11-04 | Disposition: A | Discharge status: Home / Self Care | Payer: Medicaid Other | Source: Ambulatory Visit | Attending: Obstetrics and Gynecology | Admitting: Obstetrics and Gynecology

## 2022-11-04 ENCOUNTER — Other Ambulatory Visit: Payer: Self-pay

## 2022-11-04 ENCOUNTER — Ambulatory Visit (INDEPENDENT_AMBULATORY_CARE_PROVIDER_SITE_OTHER): Payer: Medicaid Other | Admitting: Obstetrics and Gynecology

## 2022-11-04 ENCOUNTER — Encounter: Payer: Self-pay | Admitting: Obstetrics and Gynecology

## 2022-11-04 VITALS — BP 153/84 | HR 91

## 2022-11-04 DIAGNOSIS — N898 Other specified noninflammatory disorders of vagina: Secondary | ICD-10-CM

## 2022-11-04 DIAGNOSIS — Z3009 Encounter for other general counseling and advice on contraception: Secondary | ICD-10-CM

## 2022-11-04 DIAGNOSIS — Z9889 Other specified postprocedural states: Secondary | ICD-10-CM

## 2022-11-04 NOTE — Progress Notes (Addendum)
   POSTOPERATIVE VISIT NOTE   Subjective:     Deborah Santana is a 37 y.o. [id number] who presents to the clinic weeks status post LEEP 10/04/2022. Initially had pain but has improved since then. Having some vaginal discharge. Has questions about future pregnancies. Would like to start contraception and interested in birth control patch.   Review of Systems Pertinent items are noted in HPI.    Objective:    BP (!) 153/84   Pulse 91  General:  alert and cooperative  Abdomen: soft, bowel sounds active, non-tender  Pelvic:   Normal external genitalia, small volume discharge, area of slight erythema at 12 oclock - silver nitrate applied (tenderness noted with application) with otherwise normal well healed cervix    Pathology Results: FINAL MICROSCOPIC DIAGNOSIS:   A. 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    Assessment:   Doing well postoperatively. Operative findings again reviewed. Pathology report discussed.    Plan:   1. S/P LEEP Reviewed pathology results - negative margins. Recommend repeat pap with co-testing in 6 months.   2. Vaginal discharge Vaginitis swab collected, suspect BV.  - Cervicovaginal ancillary only( [facility])  3. Encounter for counseling regarding contraception Reviewed all forms of birth control options available including abstinence; over the counter/barrier methods; hormonal contraceptive medication including pill, patch, ring, injection,contraceptive implant; hormonal and nonhormonal IUDs; permanent sterilization options including vasectomy and the various tubal sterilization modalities. Risks and benefits reviewed.  Questions were answered.  Information was given to patient to review.   Opts for birth control patch. Only had BP issue with pregnancy not currently on BP meds and no issues with BP outside of pregnancy  The  use of the combination hormone contraceptive has been fully discussed with the patient. This includes the proper method to initiate (i.e. Sunday start after next normal menstrual onset versus same day start) and continue the patch, the need for regular compliance to ensure adequate contraceptive effect, the physiology which make the patch effective, the instructions for what to do in event of a missed patch, and warnings about anticipated minor side effects such as breakthrough spotting, nausea, breast tenderness, weight changes, acne, headaches, etc.  They have been told of the more serious potential side effects such as MI, stroke, and deep vein thrombosis, all of which are very unlikely.  They have been asked to report any signs of such serious problems immediately.  They should back up the patch with a condom during any cycle in which antibiotics are prescribed, and during the first cycle as well. The need for additional protection, such as a condom, to prevent exposure to sexually transmitted diseases has also been discussed- the patient has been clearly reminded that CHCs cannot protect them against diseases such as HIV and others. They understand and wish to take the medication as prescribed. They was given a prescription for xulane and will return in 3 months for BP and contraceptive check.   Darliss Cheney, MD [place], [facility] for [employer], [place]

## 2022-11-05 LAB — CERVICOVAGINAL ANCILLARY ONLY
Bacterial Vaginitis (gardnerella): POSITIVE — AB
Candida Glabrata: NEGATIVE
Candida Vaginitis: NEGATIVE
Comment: NEGATIVE
Comment: NEGATIVE
Comment: NEGATIVE

## 2022-11-08 ENCOUNTER — Other Ambulatory Visit: Payer: Self-pay | Admitting: Obstetrics and Gynecology

## 2022-11-08 DIAGNOSIS — B9689 Other specified bacterial agents as the cause of diseases classified elsewhere: Secondary | ICD-10-CM

## 2022-11-08 MED ORDER — METRONIDAZOLE 500 MG PO TABS: 500.0000 mg | ORAL_TABLET | Freq: Two times a day (BID) | ORAL | 0 refills | Status: DC

## 2022-11-09 ENCOUNTER — Telehealth: Payer: Self-pay

## 2022-11-09 NOTE — Telephone Encounter (Addendum)
-----   Message from Deborah Cheney, MD sent at 11/08/2022  8:33 AM EST ----- Notify of BV and medication sent to pharmacy   Called pt with [facility] interpreter ID [phone]. Results and provider recommendation reviewed. Reviewed need to return for Pap in 6 months.

## 2022-11-18 ENCOUNTER — Ambulatory Visit (INDEPENDENT_AMBULATORY_CARE_PROVIDER_SITE_OTHER): Payer: Medicaid Other | Admitting: *Deleted

## 2022-11-18 VITALS — BP 125/75 | HR 70 | Ht 63.75 in | Wt 210.9 lb

## 2022-11-18 DIAGNOSIS — Z0130 Encounter for examination of blood pressure without abnormal findings: Secondary | ICD-10-CM

## 2022-11-18 DIAGNOSIS — Z30016 Encounter for initial prescription of transdermal patch hormonal contraceptive device: Secondary | ICD-10-CM

## 2022-11-18 MED ORDER — NORELGESTROMIN-ETH ESTRADIOL 150-35 MCG/24HR TD PTWK: 1.0000 | MEDICATED_PATCH | TRANSDERMAL | 12 refills | Status: AC

## 2022-11-18 NOTE — Patient Instructions (Signed)
Ethinyl Estradiol; Norelgestromin Patches What is this medication? ETHINYL ESTRADIOL;NORELGESTROMIN (ETH in il es tra DYE ole; nor el JES troe min) prevents ovulation and pregnancy. It belongs to a group of medications called contraceptives. It is a combination of the hormones estrogen and progestin. This medicine may be used for other purposes; ask your health care provider or pharmacist if you have questions. COMMON BRAND NAME(S): Ortho Becky Sax, ZAFEMY What should I tell my care team before I take this medication? They need to know if you have or ever had any of these conditions: Abnormal vaginal bleeding Blood clots Blood vessel disease Breast, cervical, endometrial, ovarian, liver, or uterine cancer Diabetes Gallbladder disease Having surgery Heart disease or recent heart attack High blood pressure High cholesterol or triglycerides History of irregular heartbeat or heart valve problems Kidney disease Liver disease Lupus Migraine headaches Protein C or S deficiency Recently had a baby, miscarriage, or abortion Stroke Tobacco use An unusual or allergic reaction to estrogens, progestins, other medications, foods, dyes, or preservatives Pregnant or trying to get pregnant Breastfeeding How should I use this medication? This patch is applied to the skin. Follow the directions on the prescription label. Apply to clean, dry, healthy skin on the buttock, abdomen, upper outer arm or upper torso, in a place where it will not be rubbed by tight clothing. Do not use lotions or other cosmetics on the site where the patch will go. Press the patch firmly in place for 10 seconds to ensure good contact with the skin. Change the patch every 7 days on the same day of the week for 3 weeks. You will then have a break from the patch for 1 week, after which you will apply a new patch. Do not use your medication more often than directed. Contact your care team about the use of this medication in  children. Special care may be needed. This medication has been used in female children who have started having menstrual periods. A patient package insert for the product will be given with each prescription and refill. Read this sheet carefully each time. The sheet may change frequently. Overdosage: If you think you have taken too much of this medicine contact a poison control center or emergency room at once. NOTE: This medicine is only for you. Do not share this medicine with others. What if I miss a dose? You will need to replace your patch once a week as directed. If your patch is lost or falls off, contact your care team for advice. You may need to use another form of birth control if your patch has been off for more than 1 day. What may interact with this medication? Do not take this medication with the following: Dasabuvir; ombitasvir; paritaprevir; ritonavir Ombitasvir; paritaprevir; ritonavir This medication may also interact with the following: Acetaminophen Antibiotics or medications for infections, especially rifampin, rifabutin, rifapentine, penicillins, or tetracyclines Aprepitant or fosaprepitant Armodafinil Ascorbic acid (vitamin C) Barbiturate medications, such as phenobarbital or primidone Bosentan Certain antivirals for HIV or hepatitis Certain medications for cancer treatment Certain medications for cholesterol Certain medications for seizures, such as carbamazepine, clobazam, felbamate, lamotrigine, oxcarbazepine, phenytoin, rufinamide, or topiramate Cyclosporine Dantrolene Elagolix Flibanserin Grapefruit juice Lesinurad Medications for diabetes Medications to treat fungal infections, such as griseofulvin, miconazole, fluconazole, ketoconazole, itraconazole, posaconazole, or voriconazole Mifepristone Mitotane Modafinil Morphine Mycophenolate St. John's wort Tamoxifen Temazepam Theophylline or aminophylline Thyroid hormones Tizanidine Tranexamic  acid Ulipristal Warfarin This list may not describe all possible interactions. Give your health care provider  a list of all the medicines, herbs, non-prescription drugs, or dietary supplements you use. Also tell them if you smoke, drink alcohol, or use illegal drugs. Some items may interact with your medicine. What should I watch for while using this medication? Visit your care team for regular checks on your progress. You will need a regular breast and pelvic exam and Pap smear while on this medication. Use an additional method of contraception during the first cycle that you use this patch. If you have any reason to think you are pregnant, stop using this medication right away and contact your care team. If you are using this medication for hormone related problems, it may take several cycles of use to see improvement in your condition. Smoking tobacco increases the risk of getting a blood clot or having a stroke while you are taking this medication, especially if you are older than 35 years. This medication can make your body retain fluid, making your fingers, hands, or ankles swell. Your blood pressure can go up. Contact your care team if you feel you are retaining fluid. This medication can make you more sensitive to the sun. Keep out of the sun. If you cannot avoid being in the sun, wear protective clothing and sunscreen. Do not use sun lamps, tanning beds, or tanning booths. If you wear contact lenses and notice visual changes, or if the lenses begin to feel uncomfortable, consult your eye care specialist. Tenderness, swelling, or minor bleeding of the gums may occur. Talk to your dentist if this happens. Brushing and flossing your teeth regularly may reduce the risk of side effects. Visit your dentist on a regular basis. Tell your dentist about any medications you are taking. If you are going to have elective surgery or an MRI, tell your care team that you are using this medication. You may  need to remove the patch before the procedure. Using this medication does not protect you or your partner against HIV or other sexually transmitted infections (STIs). What side effects may I notice from receiving this medication? Side effects that you should report to your care team as soon as possible: Allergic reactions--skin rash, itching, hives, swelling of the face, lips, tongue, or throat Blood clot--pain, swelling, or warmth in the leg, shortness of breath, chest pain Gallbladder problems--severe stomach pain, nausea, vomiting, fever Increase in blood pressure Liver injury--right upper belly pain, loss of appetite, nausea, light-colored stool, dark yellow or brown urine, yellowing skin or eyes, unusual weakness or fatigue New or worsening migraines or headaches Stroke--sudden numbness or weakness of the face, arm, or leg, trouble speaking, confusion, trouble walking, loss of balance or coordination, dizziness, severe headache, change in vision Unusual vaginal discharge, itching, or odor Worsening mood, feelings of depression Side effects that usually do not require medical attention (report to your care team if they continue or are bothersome): Breast pain or tenderness Dark patches of skin on the face or other sun-exposed areas Irregular menstrual cycles or spotting Nausea Weight gain This list may not describe all possible side effects. Call your doctor for medical advice about side effects. You may report side effects to FDA at 1-800-FDA-1088. Where should I keep my medication? Keep out of the reach of children and pets. Store at room temperature between 15 and 30 degrees C (59 and 86 degrees F). Keep the patch in its pouch until time of use. Throw away any unused medication after the expiration date. Dispose of used patches properly. Since a used patch may  still contain active hormones, fold the patch in half so that it sticks to itself prior to disposal. Throw away in a place where  children or pets cannot reach. NOTE: This sheet is a summary. It may not cover all possible information. If you have questions about this medicine, talk to your doctor, pharmacist, or health care provider.  2023 Elsevier/Gold Standard (2020-11-02 00:00:00)

## 2022-11-18 NOTE — Progress Notes (Signed)
Here for blood pressure check after having elevated blood pressure at her last gyn visit. She has not yet started Xulane patch. BP today wnl. Reported to Dr. Currie Paris and she will send in prescription for Xulane. Advised patient to start Xulane and make appointment at check out for 3 month follow up with Dr. Currie Paris. She voices understanding. Staci Acosta

## 2023-03-16 ENCOUNTER — Ambulatory Visit (HOSPITAL_COMMUNITY)
Admission: RE | Admit: 2023-03-16 | Discharge: 2023-03-16 | Disposition: A | Discharge status: Home / Self Care | Payer: Medicaid Other | Source: Ambulatory Visit | Attending: Nurse Practitioner | Admitting: Nurse Practitioner

## 2023-03-16 ENCOUNTER — Encounter: Payer: Self-pay | Admitting: Nurse Practitioner

## 2023-03-16 ENCOUNTER — Ambulatory Visit: Payer: Medicaid Other | Admitting: Nurse Practitioner

## 2023-03-16 VITALS — BP 124/59 | HR 72 | Temp 97.3°F | Wt 212.6 lb

## 2023-03-16 DIAGNOSIS — M545 Low back pain, unspecified: Secondary | ICD-10-CM | POA: Insufficient documentation

## 2023-03-16 DIAGNOSIS — Z130 Encounter for screening for diseases of the blood and blood-forming organs and certain disorders involving the immune mechanism: Secondary | ICD-10-CM

## 2023-03-16 DIAGNOSIS — G8929 Other chronic pain: Secondary | ICD-10-CM | POA: Diagnosis not present

## 2023-03-16 DIAGNOSIS — Z0000 Encounter for general adult medical examination without abnormal findings: Secondary | ICD-10-CM

## 2023-03-16 DIAGNOSIS — Z1329 Encounter for screening for other suspected endocrine disorder: Secondary | ICD-10-CM

## 2023-03-16 DIAGNOSIS — E669 Obesity, unspecified: Secondary | ICD-10-CM

## 2023-03-16 DIAGNOSIS — Z13228 Encounter for screening for other metabolic disorders: Secondary | ICD-10-CM

## 2023-03-16 DIAGNOSIS — Z1321 Encounter for screening for nutritional disorder: Secondary | ICD-10-CM

## 2023-03-16 MED ORDER — IBUPROFEN 600 MG PO TABS
600.0000 mg | ORAL_TABLET | Freq: Three times a day (TID) | ORAL | 0 refills | Status: AC | PRN
Start: 2023-03-16 — End: ?

## 2023-03-16 NOTE — Patient Instructions (Addendum)
Please take Tylenol 650 mg every 6 hours as needed for your low back pain.  Get your x-ray done and follow-up with orthopedics   t is important that you exercise regularly at least 30 minutes 5 times a week as tolerated  Think about what you will eat, plan ahead. Choose " clean, green, fresh or frozen" over canned, processed or packaged foods which are more sugary, salty and fatty. 70 to 75% of food eaten should be vegetables and fruit. Three meals at set times with snacks allowed between meals, but they must be fruit or vegetables. Aim to eat over a 12 hour period , example 7 am to 7 pm, and STOP after  your last meal of the day. Drink water,generally about 64 ounces per day, no other drink is as healthy. Fruit juice is best enjoyed in a healthy way, by EATING the fruit.  Thanks for choosing [facility] we consider it a privelige to serve you.

## 2023-03-16 NOTE — Progress Notes (Signed)
Complete physical exam  Patient: Deborah Santana   DOB: [birth year]   37 y.o. Female  MRN: [id number]  Subjective:    Chief Complaint  Patient presents with   Annual Exam    Deborah Santana is a 37 y.o. female  has a past medical history of HSIL (high grade squamous intraepithelial lesion) on Pap smear of cervix, Low back pain, Medical history non-contributory, and Obesity. who presents today for a complete physical exam. She reports consuming a general diet. The patient does not participate in regular exercise at present. She generally feels well. She reports sleeping well  Chronic low back pain.  Patient continues to have chronic low back pain.  She reports stiffness, numbness tingling in the low back radiating down her legs. she has done physical therapy.  Stated that physical therapy was not helpful.  Stated that she has been taking Tylenol as needed without improvement.  She is still breast-feeding her child.  Pain rated 8/10 today.  She is accompanied by her spouse Interpretation services provided via [facility] health iPad.   Most recent fall risk assessment:    03/16/2023    1:49 PM  Fall Risk   Falls in the past year? 0  Number falls in past yr: 0  Injury with Fall? 0  Risk for fall due to : No Fall Risks  Follow up Falls evaluation completed     Most recent depression screenings:    03/16/2023    1:51 PM 09/15/2022    1:37 PM  PHQ 2/9 Scores  PHQ - 2 Score 0 0        Patient Care Team: Donell Beers, FNP as PCP - General (Nurse Practitioner) Worthy Rancher, MD (Inactive) as PCP - OBGYN (Family Medicine)   Outpatient Medications Prior to Visit  Medication Sig   norelgestromin-ethinyl estradiol [facility]) 150-35 MCG/24HR transdermal patch Place 1 patch onto the skin once a week.   metroNIDAZOLE (FLAGYL) 500 MG tablet Take 1 tablet (500 mg total) by mouth 2 (two) times daily. (Patient not taking: Reported on 03/16/2023)   [DISCONTINUED] ibuprofen (ADVIL) 800 MG tablet  Take 1 tablet (800 mg total) by mouth every 6 (six) hours. (Patient not taking: Reported on 03/16/2023)   No facility-administered medications prior to visit.    Review of Systems  Constitutional:  Negative for activity change, appetite change, chills, fatigue and fever.  HENT:  Negative for congestion, dental problem, ear discharge, ear pain, hearing loss, rhinorrhea, sinus pressure, sinus pain, sneezing and sore throat.   Eyes:  Negative for pain, discharge, redness and itching.  Respiratory:  Negative for cough, chest tightness, shortness of breath and wheezing.   Cardiovascular:  Negative for chest pain, palpitations and leg swelling.  Gastrointestinal:  Negative for abdominal distention, abdominal pain, anal bleeding, blood in stool, constipation, diarrhea, nausea, rectal pain and vomiting.  Endocrine: Negative for cold intolerance, heat intolerance, polydipsia, polyphagia and polyuria.  Genitourinary:  Negative for difficulty urinating, dysuria, flank pain, frequency, hematuria, menstrual problem, pelvic pain and vaginal bleeding.  Musculoskeletal:  Positive for back pain. Negative for arthralgias, gait problem, joint swelling and myalgias.       Chronic  Skin:  Negative for color change, pallor, rash and wound.  Allergic/Immunologic: Negative for environmental allergies, food allergies and immunocompromised state.  Neurological:  Negative for dizziness, tremors, facial asymmetry, weakness and headaches.  Hematological:  Negative for adenopathy. Does not bruise/bleed easily.  Psychiatric/Behavioral:  Negative for agitation, behavioral problems, confusion, decreased concentration, hallucinations, self-injury  and suicidal ideas.        Objective:     BP (!) 124/59   Pulse 72   Temp (!) 97.3 F (36.3 C)   Wt 212 lb 9.6 oz (96.4 kg)   LMP 02/25/2023 (Approximate)   SpO2 99%   BMI 36.78 kg/m    Physical Exam Vitals and nursing note reviewed. Exam conducted with a chaperone  present.  Constitutional:      General: She is not in acute distress.    Appearance: Normal appearance. She is obese. She is not ill-appearing, toxic-appearing or diaphoretic.  HENT:     Right Ear: Tympanic membrane, ear canal and external ear normal. There is no impacted cerumen.     Left Ear: Tympanic membrane, ear canal and external ear normal. There is no impacted cerumen.     Nose: Nose normal. No congestion or rhinorrhea.     Mouth/Throat:     Mouth: Mucous membranes are moist.     Pharynx: Oropharynx is clear. No oropharyngeal exudate or posterior oropharyngeal erythema.  Eyes:     General: No scleral icterus.       Right eye: No discharge.        Left eye: No discharge.     Extraocular Movements: Extraocular movements intact.     Conjunctiva/sclera: Conjunctivae normal.  Neck:     Vascular: No carotid bruit.  Cardiovascular:     Rate and Rhythm: Normal rate and regular rhythm.     Pulses: Normal pulses.     Heart sounds: Normal heart sounds. No murmur heard.    No friction rub. No gallop.  Pulmonary:     Effort: Pulmonary effort is normal. No respiratory distress.     Breath sounds: Normal breath sounds. No stridor. No wheezing, rhonchi or rales.  Chest:     Chest wall: No mass, lacerations, deformity, swelling, tenderness, crepitus or edema. There is no dullness to percussion.  Breasts:    Tanner Score is 5.     Breasts are symmetrical.     Right: Normal. No swelling, bleeding, inverted nipple, mass, nipple discharge, skin change or tenderness.     Left: Normal. No swelling, bleeding, inverted nipple, mass, nipple discharge or skin change.  Abdominal:     General: Bowel sounds are normal. There is no distension.     Palpations: Abdomen is soft. There is no mass.     Tenderness: There is no abdominal tenderness. There is no right CVA tenderness, left CVA tenderness, guarding or rebound.     Hernia: No hernia is present.  Musculoskeletal:        General: No swelling,  tenderness, deformity or signs of injury.     Cervical back: Normal range of motion and neck supple. No rigidity or tenderness.     Right lower leg: No edema.     Left lower leg: No edema.     Comments: Tenderness on palpation of mid low back area, no redness or swelling noted  Lymphadenopathy:     Cervical: No cervical adenopathy.     Upper Body:     Right upper body: No supraclavicular, axillary or pectoral adenopathy.     Left upper body: No supraclavicular, axillary or pectoral adenopathy.  Skin:    General: Skin is warm and dry.     Capillary Refill: Capillary refill takes 2 to 3 seconds.     Coloration: Skin is not jaundiced or pale.     Findings: No bruising, erythema, lesion or rash.  Neurological:     Mental Status: She is alert and oriented to person, place, and time.     Cranial Nerves: No cranial nerve deficit.     Sensory: No sensory deficit.     Motor: No weakness.     Coordination: Coordination normal.     Gait: Gait normal.     Deep Tendon Reflexes: Reflexes normal.  Psychiatric:        Mood and Affect: Mood normal.        Behavior: Behavior normal.        Thought Content: Thought content normal.        Judgment: Judgment normal.     No results found for any visits on 03/16/23.     Assessment & Plan:    Routine Health Maintenance and Physical Exam  Immunization History  Administered Date(s) Administered   Influenza Split 06/10/2015   Influenza,inj,Quad PF,6+ Mos 08/25/2016, 06/27/2017, 08/17/2018   Tdap 09/28/2016, 08/17/2018, 03/11/2022    Health Maintenance  Topic Date Due   COVID-19 Vaccine (1) 09/28/2023 (Originally 04/27/1986)   INFLUENZA VACCINE  04/14/2023   PAP SMEAR-Modifier  01/21/2025   DTaP/Tdap/Td (4 - Td or Tdap) 03/11/2032   Hepatitis C Screening  Completed   HIV Screening  Completed   HPV VACCINES  Aged Out    Discussed health benefits of physical activity, and encouraged her to engage in regular exercise appropriate for her age  and condition.  Problem List Items Addressed This Visit       Other   Chronic bilateral low back pain    - DG Lumbar Spine 2-3 Views - Ambulatory referral to Orthopedic Surgery Patient encouraged to take Tylenol 650 mg every 6 hours as needed Application of heat encouraged Regular moderate to vigorous exercises encouraged Will order ibuprofen 600 mg 3 times daily as needed  According to update  Ibuprofen is the preferred NSAID in breastfeeding individuals but only because it is secreted into breast milk in very small amounts, and there is the most information on its safety. Ibuprofen levels were undetectable in the breast milk of individuals taking 1600 mg of ibuprofen a day [60].       Relevant Medications   ibuprofen (ADVIL) 600 MG tablet   Other Relevant Orders   DG Lumbar Spine 2-3 Views   Ambulatory referral to Orthopedic Surgery   CBC with Differential/Platelet   Obesity (BMI 35.0-39.9 without comorbidity)    Wt Readings from Last 3 Encounters:  03/16/23 212 lb 9.6 oz (96.4 kg)  11/18/22 210 lb 14.4 oz (95.7 kg)  09/15/22 214 lb 6.4 oz (97.3 kg)   Body mass index is 36.78 kg/m.  Patient counseled on low-carb modified diet Encouraged to engage in regular moderate to vigorous exercises at least 150 minutes weekly Benefits of healthy weights discussed Patient referred to the medical weight management clinic      Relevant Orders   Amb Ref to Medical Weight Management   Annual physical exam - Primary   Other Visit Diagnoses     Screening for endocrine, nutritional, metabolic and immunity disorder       Relevant Orders   CMP14+EGFR   Lipid panel   TSH      Return in about 1 year (around 03/15/2024) for CPE.     Donell Beers, FNP

## 2023-03-16 NOTE — Assessment & Plan Note (Addendum)
-   DG Lumbar Spine 2-3 Views - Ambulatory referral to Orthopedic Surgery Patient encouraged to take Tylenol 650 mg every 6 hours as needed Application of heat encouraged Regular moderate to vigorous exercises encouraged Will order ibuprofen 600 mg 3 times daily as needed  According to update  Ibuprofen is the preferred NSAID in breastfeeding individuals but only because it is secreted into breast milk in very small amounts, and there is the most information on its safety. Ibuprofen levels were undetectable in the breast milk of individuals taking 1600 mg of ibuprofen a day [60].

## 2023-03-16 NOTE — Assessment & Plan Note (Signed)
Annual exam as documented.  Counseling done include healthy lifestyle involving committing to 150 minutes of exercise per week, heart healthy diet, and attaining healthy weight. The importance of adequate sleep also discussed.  Regular use of seat belt and home safety were also discussed . Changes in health habits are decided on by patient with goals and time frames set for achieving them. Immunization and cancer screening  needs are specifically addressed at this visit.    Routine fasting labs ordered 

## 2023-03-16 NOTE — Assessment & Plan Note (Addendum)
Wt Readings from Last 3 Encounters:  03/16/23 212 lb 9.6 oz (96.4 kg)  11/18/22 210 lb 14.4 oz (95.7 kg)  09/15/22 214 lb 6.4 oz (97.3 kg)   Body mass index is 36.78 kg/m.  Patient counseled on low-carb modified diet Encouraged to engage in regular moderate to vigorous exercises at least 150 minutes weekly Benefits of healthy weights discussed Patient referred to the medical weight management clinic

## 2023-03-17 LAB — CBC WITH DIFFERENTIAL/PLATELET
Basophils Absolute: 0 10*3/uL (ref 0.0–0.2)
Basos: 1 %
EOS (ABSOLUTE): 0.4 10*3/uL (ref 0.0–0.4)
Eos: 7 %
Hematocrit: 37.6 % (ref 34.0–46.6)
Hemoglobin: 12.3 g/dL (ref 11.1–15.9)
Immature Grans (Abs): 0 10*3/uL (ref 0.0–0.1)
Immature Granulocytes: 0 %
Lymphocytes Absolute: 2.5 10*3/uL (ref 0.7–3.1)
Lymphs: 41 %
MCH: 26.5 pg — ABNORMAL LOW (ref 26.6–33.0)
MCHC: 32.7 g/dL (ref 31.5–35.7)
MCV: 81 fL (ref 79–97)
Monocytes Absolute: 0.5 10*3/uL (ref 0.1–0.9)
Monocytes: 9 %
Neutrophils Absolute: 2.5 10*3/uL (ref 1.4–7.0)
Neutrophils: 42 %
Platelets: 195 10*3/uL (ref 150–450)
RBC: 4.65 x10E6/uL (ref 3.77–5.28)
RDW: 12.8 % (ref 11.7–15.4)
WBC: 6 10*3/uL (ref 3.4–10.8)

## 2023-03-17 LAB — CMP14+EGFR
ALT: 11 IU/L (ref 0–32)
AST: 11 IU/L (ref 0–40)
Albumin: 4 g/dL (ref 3.9–4.9)
Alkaline Phosphatase: 111 IU/L (ref 44–121)
BUN/Creatinine Ratio: 13 (ref 9–23)
BUN: 9 mg/dL (ref 6–20)
Bilirubin Total: <0.2 mg/dL (ref 0.0–1.2)
CO2: 23 mmol/L (ref 20–29)
Calcium: 9.3 mg/dL (ref 8.7–10.2)
Chloride: 101 mmol/L (ref 96–106)
Creatinine, Ser: 0.71 mg/dL (ref 0.57–1.00)
Globulin, Total: 3.1 g/dL (ref 1.5–4.5)
Glucose: 84 mg/dL (ref 70–99)
Potassium: 4.2 mmol/L (ref 3.5–5.2)
Sodium: 137 mmol/L (ref 134–144)
Total Protein: 7.1 g/dL (ref 6.0–8.5)
eGFR: 111 mL/min/{1.73_m2} (ref >59)

## 2023-03-17 LAB — LIPID PANEL
Chol/HDL Ratio: 3 ratio (ref 0.0–4.4)
Cholesterol, Total: 218 mg/dL — ABNORMAL HIGH (ref 100–199)
HDL: 72 mg/dL (ref >39)
LDL Chol Calc (NIH): 128 mg/dL — ABNORMAL HIGH (ref 0–99)
Triglycerides: 101 mg/dL (ref 0–149)
VLDL Cholesterol Cal: 18 mg/dL (ref 5–40)

## 2023-03-17 LAB — TSH: TSH: 1.41 u[IU]/mL (ref 0.450–4.500)

## 2023-04-18 ENCOUNTER — Ambulatory Visit (INDEPENDENT_AMBULATORY_CARE_PROVIDER_SITE_OTHER): Payer: Medicaid Other | Admitting: Orthopedic Surgery

## 2023-04-18 DIAGNOSIS — M533 Sacrococcygeal disorders, not elsewhere classified: Secondary | ICD-10-CM

## 2023-04-18 DIAGNOSIS — G8929 Other chronic pain: Secondary | ICD-10-CM | POA: Diagnosis not present

## 2023-04-18 NOTE — Progress Notes (Signed)
Orthopedic Spine Surgery Office Note  Assessment: Patient is a 37 y.o. female with chronic, progressive low back pain.  Possible SI joint as etiology  Plan: -Explained that initially conservative treatment is tried as a significant number of patients may experience relief with these treatment modalities. Discussed that the conservative treatments include:  -activity modification  -physical therapy  -over the counter pain medications  -medrol dosepak  -lumbar steroid injections -Patient has tried PT, Tylenol, NSAIDs, oral steroids -Recommended diagnostic/therapeutic bilateral SI joint injections with Dr. Shon Baton -If she is not doing any better at her next visit, will order MRI of the lumbar spine to evaluate further -Discussed weight loss as potentially helpful with her pain and her overall health -Patient should return to office in 5-6 weeks, x-rays at next visit: None   Patient expressed understanding of the plan and all questions were answered to the patient's satisfaction.   ___________________________________________________________________________   History:  Patient is a 37 y.o. female who presents today for lumbar spine.  Patient has had about 7 years of low back pain.  There is no trauma or injury that preceded the onset of pain.  Pain has gotten progressively worse with time.  She does not have any pain radiating to either lower extremity.  Pain is felt in the lower lumbar region in the bilateral paraspinal regions.  She notices the pain on a daily basis.  Pain is worse with activity but she also feels it at rest.  She has not found any treatment to provide her with lasting relief.   Weakness: Denies Symptoms of imbalance: Denies Paresthesias and numbness: Denies Bowel or bladder incontinence: Denies Saddle anesthesia: Denies  Treatments tried: PT, Tylenol, NSAIDs, oral steroids  Review of systems: Denies fevers and chills, night sweats, unexplained weight loss, history  of cancer, pain that wakes them at night  Past medical history: Chronic low back pain  Allergies: NKDA  Past surgical history:  None  Social history: Denies use of nicotine product (smoking, vaping, patches, smokeless) Alcohol use: Denies Denies recreational drug use   Physical Exam:  BMI of 36.8  General: no acute distress, appears stated age Neurologic: alert, answering questions appropriately, following commands Respiratory: unlabored breathing on room air, symmetric chest rise Psychiatric: appropriate affect, normal cadence to speech   MSK (spine):  -Strength exam      Left  Right EHL    5/5  5/5 TA    5/5  5/5 GSC    5/5  5/5 Knee extension  5/5  5/5 Hip flexion   5/5  5/5  -Sensory exam    Sensation intact to light touch in L3-S1 nerve distributions of bilateral lower extremities  -Achilles DTR: 1/4 on the left, 1/4 on the right -Patellar tendon DTR: 1/4 on the left, 1/4 on the right  -Straight leg raise: Negative bilaterally -Femoral nerve stretch test: Negative bilaterally -Clonus: no beats bilaterally  -Left hip exam: No pain through range of motion, negative FABER, positive SI joint compression test, positive Fortin finger test, tender to palpation over the SI joint, positive Gaenslen's, negative Stinchfield -Right hip exam: No pain through range of motion, negative FABER, negative SI joint compression test, tender to palpation over the SI joint, negative Stinchfield  Imaging: XR of the lumbar spine from 03/16/2023 was independently reviewed and interpreted, showing lordotic alignment.  No significant degenerative changes.  No spondylolisthesis seen.  No fracture or dislocation seen.   Patient name: Deborah Santana Patient MRN: [id number] Date of visit: 04/18/23

## 2023-04-27 ENCOUNTER — Other Ambulatory Visit (HOSPITAL_COMMUNITY)
Admission: RE | Admit: 2023-04-27 | Payer: Medicaid Other | Source: Ambulatory Visit | Admitting: Obstetrics and Gynecology

## 2023-04-27 ENCOUNTER — Other Ambulatory Visit: Payer: Self-pay

## 2023-04-27 ENCOUNTER — Encounter: Payer: Self-pay | Admitting: Obstetrics and Gynecology

## 2023-04-27 ENCOUNTER — Ambulatory Visit (INDEPENDENT_AMBULATORY_CARE_PROVIDER_SITE_OTHER): Payer: Medicaid Other | Admitting: Obstetrics and Gynecology

## 2023-04-27 VITALS — BP 137/85 | HR 73 | Wt 208.6 lb

## 2023-04-27 DIAGNOSIS — Z9889 Other specified postprocedural states: Secondary | ICD-10-CM

## 2023-04-27 DIAGNOSIS — D069 Carcinoma in situ of cervix, unspecified: Secondary | ICD-10-CM

## 2023-04-27 DIAGNOSIS — Z603 Acculturation difficulty: Secondary | ICD-10-CM | POA: Diagnosis not present

## 2023-04-27 DIAGNOSIS — Z3009 Encounter for other general counseling and advice on contraception: Secondary | ICD-10-CM

## 2023-04-27 DIAGNOSIS — Z758 Other problems related to medical facilities and other health care: Secondary | ICD-10-CM

## 2023-04-27 NOTE — Progress Notes (Signed)
    GYNECOLOGY VISIT  Patient name: Deborah Santana MRN [id number]  Date of birth: 1986-02-06 Chief Complaint:   Gynecologic Exam  History:  Deborah Santana is a 37 y.o. [id number] being seen today for post LEEP pap.  Patient also interested in having IUD inserted for contraception.   Past Medical History:  Diagnosis Date   HSIL (high grade squamous intraepithelial lesion) on Pap smear of cervix    Low back pain    Medical history non-contributory    Obesity     Past Surgical History:  Procedure Laterality Date   COLPOSCOPY  02/26/2022   NO PAST SURGERIES      The following portions of the patient's history were reviewed and updated as appropriate: allergies, current medications, past family history, past medical history, past social history, past surgical history and problem list.   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 Comprehensive review of systems was otherwise negative.   Objective:  Physical Exam BP 137/85   Pulse 73   Wt 208 lb 9.6 oz (94.6 kg)   LMP  (LMP Unknown)   BMI 36.09 kg/m    Physical Exam Vitals and nursing note reviewed. Exam conducted with a chaperone present.  Constitutional:      Appearance: Normal appearance.  HENT:     Head: Normocephalic and atraumatic.  Pulmonary:     Effort: Pulmonary effort is normal.     Breath sounds: Normal breath sounds.  Genitourinary:    General: Normal vulva.     Exam position: Lithotomy position.     Vagina: Normal.     Cervix: Normal. No friability.     Comments: Slight discomfort with pap brush  Skin:    General: Skin is warm and dry.  Neurological:     General: No focal deficit present.     Mental Status: She is alert.  Psychiatric:        Mood and Affect: Mood normal.        Behavior: Behavior normal.        Thought Content: Thought content normal.         Judgment: Judgment normal.         Assessment & Plan:   1. High grade squamous intraepithelial lesion (HGSIL), grade 3 CIN, on biopsy of cervix 2. S/P LEEP Post LEEP pap collected today   3. Encounter for counseling regarding contraception Previously prescribed patch, would like to have IUD inserted. Reviewed various IUDs and approved length of use. Offered liletta/mirena today with removal in 5 years but patient declined and would like to return for [facility] insertion. No kyleena available in clinic so patient will return for IUD insertion.   4. Language barrier In person Swahili interpreter   Routine preventative health maintenance measures emphasized.  Lorriane Shire, MD [facility]ally [facility]nec[facility] for [facility], [facility]

## 2023-04-28 ENCOUNTER — Ambulatory Visit: Payer: Medicaid Other | Admitting: Sports Medicine

## 2023-05-02 LAB — CYTOLOGY - PAP
Adequacy: ABSENT
Comment: NEGATIVE
Diagnosis: NEGATIVE
High risk HPV: NEGATIVE

## 2023-05-05 ENCOUNTER — Telehealth: Payer: Self-pay

## 2023-05-05 NOTE — Telephone Encounter (Addendum)
-----   Message from Lorriane Shire sent at 05/02/2023  1:18 PM EDT ----- Notify of normal pap - recommend repeat in 1 year    Called pt with [employer] interpreter Yvan ID #[email or web address]. Results and recommendation reviewed. Pap recall set in [facility] for 1 year.

## 2023-05-06 ENCOUNTER — Encounter: Payer: Self-pay | Admitting: Sports Medicine

## 2023-05-06 ENCOUNTER — Ambulatory Visit: Payer: Medicaid Other | Admitting: Sports Medicine

## 2023-05-06 ENCOUNTER — Other Ambulatory Visit: Payer: Self-pay

## 2023-05-06 DIAGNOSIS — M533 Sacrococcygeal disorders, not elsewhere classified: Secondary | ICD-10-CM

## 2023-05-06 DIAGNOSIS — G8929 Other chronic pain: Secondary | ICD-10-CM

## 2023-05-06 NOTE — Progress Notes (Signed)
   Procedure Note  Patient: Deborah Santana             Date of Birth: [birth year]           MRN: [id number]             Visit Date: 05/06/2023  Sent by Dr. Christell Constant for dx/therapeutic b/l SI-joint injections.  The use of an in person Swahili interpreter was used throughout the entirety of the visit today.  Procedures: Visit Diagnoses:  1. Chronic left SI joint pain   2. Chronic right SI joint pain    U/S-guided SI-joint injection, Right   After discussion of risk/benefits/indications, informed verbal consent was obtained. A timeout was then performed. The patient was positioned in a prone position on exam room table with a pillow placed under the pelvis for mild hip flexion. The SI joint area was cleaned and prepped with betadine and alcohol swabs. Sterile ultrasound gel was applied and the ultrasound transducer was placed in an anatomic axial plane over the PSIS, then moved distally over the SI-joint. Using ultrasound guidance, a 22-gauge, 3.5" needle was inserted from a medial to lateral approach utilizing an in-plane approach and directed into the SI-joint. The SI-joint was then injected with a mixture of 4:1 lidocaine:depomedrol with visualization of the injectate flow into the SI-joint under ultrasound visualization. The patient tolerated the procedure well without immediate complications.  U/S-guided SI-joint injection, Left   After discussion of risk/benefits/indications, informed verbal consent was obtained. A timeout was then performed. The patient was positioned in a prone position on exam room table with a pillow placed under the pelvis for mild hip flexion. The SI joint area was cleaned and prepped with betadine and alcohol swabs. Sterile ultrasound gel was applied and the ultrasound transducer was placed in an anatomic axial plane over the PSIS, then moved distally over the SI-joint. Using ultrasound guidance, a 22-gauge, 3.5" needle was inserted from a medial to lateral approach utilizing  an in-plane approach and directed into the SI-joint. The SI-joint was then injected with a mixture of 4:1 lidocaine:depomedrol with visualization of the injectate flow into the SI-joint under ultrasound visualization. The patient tolerated the procedure well without immediate complications.  - I evaluated the patient about 5 minutes post-injection and she some improvement in pain and range of motion from anesthetic portion. No Ae's.  - follow-up with Dr. Christell Constant as indicated; I am happy to see them as needed  Madelyn Brunner, DO Primary Care Sports Medicine Physician  [facility] - Orthopedics  This note was dictated using Dragon naturally speaking software and may contain errors in syntax, spelling, or content which have not been identified prior to signing this note.

## 2023-05-19 ENCOUNTER — Ambulatory Visit: Payer: Medicaid Other | Admitting: Orthopedic Surgery

## 2023-05-23 ENCOUNTER — Ambulatory Visit: Payer: Medicaid Other | Admitting: Orthopedic Surgery

## 2023-05-23 DIAGNOSIS — M533 Sacrococcygeal disorders, not elsewhere classified: Secondary | ICD-10-CM

## 2023-05-23 DIAGNOSIS — G8929 Other chronic pain: Secondary | ICD-10-CM

## 2023-05-23 MED ORDER — DICLOFENAC SODIUM 75 MG PO TBEC: 75.0000 mg | DELAYED_RELEASE_TABLET | Freq: Two times a day (BID) | ORAL | 0 refills | Status: DC

## 2023-05-23 NOTE — Progress Notes (Signed)
Orthopedic Spine Surgery Office Note   Assessment: Patient is a 37 y.o. female with chronic, progressive low back pain.  No radicular symptoms   Plan: -Explained that initially conservative treatment is tried as a significant number of patients may experience relief with these treatment modalities. Discussed that the conservative treatments include:             -activity modification             -physical therapy             -over the counter pain medications             -medrol dosepak             -lumbar steroid injections -Patient has tried Tylenol, NSAIDs, oral steroids -Since injections only gave her 50% relief and it only lasted 2 weeks, did not recommend further injections at this time.  She also has no physical exam findings of SI joint pathology on today's exam -Patient wanted to try physical therapy and medications prior to going forward with the MRI.  Told her to take up to 1000 mg of Tylenol 3 times daily.  I prescribed diclofenac.  A referral to PT was provided to her as well -Patient should return to office in 6 weeks, x-rays at next visit: None     Patient expressed understanding of the plan and all questions were answered to the patient's satisfaction.    ___________________________________________________________________________     History:   Patient is a 37 y.o. female who presents today for follow-up after injection.  Patient states that she got bilateral SI joint injections with Dr. Shon Baton.  She got about 2 weeks of 50% relief of her pain.  She is still noticing some improvement but is not as good as it was for those 2 weeks.  She says the pain is worse with bending over.  She still does not have any pain radiating into either lower extremity.  She has not noticed any changes in her symptoms since she was last seen by me.     Treatments tried: Tylenol, NSAIDs, oral steroids     Physical Exam:   General: no acute distress, appears stated age Neurologic: alert,  answering questions appropriately, following commands Respiratory: unlabored breathing on room air, symmetric chest rise Psychiatric: appropriate affect, normal cadence to speech     MSK (spine):   -Strength exam                                                   Left                  Right EHL                              5/5                  5/5 TA                                 5/5                  5/5 GSC  5/5                  5/5 Knee extension            5/5                  5/5 Hip flexion                    5/5                  5/5   -Sensory exam                           Sensation intact to light touch in L3-S1 nerve distributions of bilateral lower extremities   -Achilles DTR: 1/4 on the left, 1/4 on the right -Patellar tendon DTR: 1/4 on the left, 1/4 on the right   -Straight leg raise: Negative bilaterally -Femoral nerve stretch test: Negative bilaterally -Clonus: no beats bilaterally   -Left hip exam: No pain through range of motion, negative FABER, negative SI joint compression test, negative Stinchfield, negative Gaenslen's -Right hip exam: No pain through range of motion, negative FABER, negative SI joint compression test, negative Stinchfield, negative Gaenslen's   Imaging: XR of the lumbar spine from 03/16/2023 was previously independently reviewed and interpreted, showing lordotic alignment.  No significant degenerative changes.  No spondylolisthesis seen.  No fracture or dislocation seen.     Patient name: Deborah Santana Patient MRN: [id number] Date of visit: 05/23/23

## 2023-05-24 MED ORDER — NAPROXEN 500 MG PO TABS: 500.0000 mg | ORAL_TABLET | Freq: Two times a day (BID) | ORAL | 0 refills | Status: AC

## 2023-05-24 NOTE — Addendum Note (Signed)
Addended by: Willia Craze on: 05/24/2023 10:58 AM   Modules accepted: Orders

## 2023-05-25 ENCOUNTER — Other Ambulatory Visit: Payer: Self-pay

## 2023-05-25 ENCOUNTER — Ambulatory Visit: Payer: Medicaid Other | Admitting: Obstetrics and Gynecology

## 2023-05-25 VITALS — BP 132/73 | HR 75 | Wt 206.0 lb

## 2023-05-25 DIAGNOSIS — Z975 Presence of (intrauterine) contraceptive device: Secondary | ICD-10-CM | POA: Insufficient documentation

## 2023-05-25 DIAGNOSIS — Z30430 Encounter for insertion of intrauterine contraceptive device: Secondary | ICD-10-CM

## 2023-05-25 LAB — POCT PREGNANCY, URINE: Preg Test, Ur: NEGATIVE

## 2023-05-25 MED ORDER — IBUPROFEN 800 MG PO TABS
800.0000 mg | ORAL_TABLET | Freq: Once | ORAL | Status: AC
Start: 2023-05-25 — End: 2023-05-25
  Administered 2023-05-25: 800 mg via ORAL

## 2023-05-25 MED ORDER — LEVONORGESTREL 19.5 MG IU IUD
INTRAUTERINE_SYSTEM | Freq: Once | INTRAUTERINE | Status: AC
Start: 2023-05-25 — End: 2023-05-25

## 2023-05-25 NOTE — Progress Notes (Signed)
IUD Insertion Procedure Note Patient identified, informed consent performed, consent signed.   Discussed risks of irregular bleeding, cramping, infection, malpositioning or misplacement of the IUD outside the uterus which may require further procedure such as laparoscopy. Also discussed >99% contraception efficacy, increased risk of ectopic pregnancy with failure of method.  Time out was performed.  Urine pregnancy test negative.  Speculum placed in the vagina.  Cervix visualized.  Cleaned with Betadine x 2.  Anterior cervix grasped with a single tooth tenaculum.  Paracervical block was not administered. A disposable sound was used to dilate the cervical os. Kyleena IUD placed per manufacturer's recommendations.  Strings trimmed to 3 cm. Tenaculum was removed, good hemostasis noted.  Patient tolerated procedure well.   Patient was given post-procedure instructions.  She was advised to have backup contraception for one week.  Patient was also asked to check IUD strings periodically and follow up prn for IUD check.   Lorriane Shire, MD, FACOG Minimally Invasive Gynecologic Surgery  Obstetrics and Gynecology, [facility] for [facility], [facility] 05/25/2023

## 2023-05-27 ENCOUNTER — Encounter (HOSPITAL_COMMUNITY): Payer: Self-pay

## 2023-05-27 ENCOUNTER — Ambulatory Visit (HOSPITAL_COMMUNITY)
Admission: EM | Admit: 2023-05-27 | Discharge: 2023-05-27 | Disposition: A | Discharge status: Home / Self Care | Payer: Medicaid Other | Attending: Emergency Medicine | Admitting: Emergency Medicine

## 2023-05-27 DIAGNOSIS — H5704 Mydriasis: Secondary | ICD-10-CM

## 2023-05-27 DIAGNOSIS — H1013 Acute atopic conjunctivitis, bilateral: Secondary | ICD-10-CM

## 2023-05-27 MED ORDER — OLOPATADINE HCL 0.1 % OP SOLN: 1.0000 [drp] | Freq: Two times a day (BID) | OPHTHALMIC | 12 refills | Status: AC

## 2023-05-27 NOTE — Discharge Instructions (Addendum)
Wanafunzi wako wamepanuka kutokana na matumizi yako ya atropine. Hii inapaswa Sharlett Iles siku 3 zijazo, [place] vizuri na kupanuka kwa mwanafunzi kunaweza kudumu kwa hadi siku 10. Tafadhali usitumie tena atropine. Kwa kuwasha kwa jicho la pande mbili unaweza kutumia Pataday mara mbili kwa siku, tone 1 asubuhi, na tone 1 usiku kwa macho yote mawili.  Tafuta huduma ya haraka ikiwa utapata upotezaji wa [place], maumivu ya macho, au dalili zozote [place].  Your pupils are dilated due to your use of atropine.  This should improve over the next 3 days, blurred vision and pupil dilation may last for up to 10 days.  Please do not use any more of the atropine.  For your bilateral eye itching you can use the Pataday twice daily, 1 drop in the morning, and 1 drop at night to both eyes.  Seek immediate care if you develop any vision loss, eye pain, or any new concerning symptoms.

## 2023-05-27 NOTE — ED Provider Notes (Signed)
[facility]    CSN: [id number] Arrival date & time: 05/27/23  4098      History   Chief Complaint Chief Complaint  Patient presents with   Eye Problem    HPI Deborah Santana is a 37 y.o. female.   Patient presents to clinic with complaint of blurry vision after using atropine drops to both eyes yesterday.  She used the drops to try to resolve a gritty, dry, itchy sensation to her eyes.  She denies any eye discharge.  Endorses vision changes.  Some light sensitivity.  Language interpreter used for this visit.  The history is provided by the patient and medical records. The history is limited by a language barrier. A language interpreter was used.  Eye Problem Associated symptoms: itching, photophobia and redness   Associated symptoms: no discharge     Past Medical History:  Diagnosis Date   HSIL (high grade squamous intraepithelial lesion) on Pap smear of cervix    Low back pain    Medical history non-contributory    Obesity     Patient Active Problem List   Diagnosis Date Noted   IUD (intrauterine device) in place 05/25/2023   High grade squamous intraepithelial lesion (HGSIL), grade 3 CIN, on biopsy of cervix 04/27/2023   Annual physical exam 03/16/2023   Chronic bilateral low back pain 09/15/2022   Obesity (BMI 35.0-39.9 without comorbidity) 09/15/2022   Normal labor 05/27/2022   Gestational hypertension 05/27/2022   Grand multipara 05/03/2022   Hx of shoulder dystocia in prior pregnancy, currently pregnant 04/01/2022   Poor compliance 03/11/2022   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 03/11/2022   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, antepartum 02/15/2022   Anti-M isoimmunization affecting pregnancy, 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   Supervision of other normal pregnancy, antepartum 12/31/2021   AMA (advanced maternal age) multigravida 35+, second trimester 12/31/2021   Anemia in pregnancy 11/16/2018   HSIL (high grade  squamous intraepithelial lesion) on Pap smear of cervix 08/31/2016   Language barrier 08/27/2016    Past Surgical History:  Procedure Laterality Date   COLPOSCOPY  02/26/2022   NO PAST SURGERIES      OB History     Gravida  7   Para  7   Term  7   Preterm      AB      Living  7      SAB      IAB      Ectopic      Multiple  0   Live Births  7            Home Medications    Prior to Admission medications   Medication Sig Start Date End Date Taking? Authorizing Provider  olopatadine (PATADAY) 0.1 % ophthalmic solution Place 1 drop into both eyes 2 (two) times daily. 05/27/23  Yes Rinaldo Ratel, [place] N, FNP  ibuprofen (ADVIL) 600 MG tablet Take 1 tablet (600 mg total) by mouth every 8 (eight) hours as needed. 03/16/23   Donell Beers, FNP  naproxen (NAPROSYN) 500 MG tablet Take 1 tablet (500 mg total) by mouth 2 (two) times daily with a meal. 05/24/23 07/05/23  London Sheer, MD  norelgestromin-ethinyl estradiol [employer] [employer]) 150-35 MCG/24HR transdermal patch Place 1 patch onto the skin once a week. 11/18/22   Lorriane Shire, MD    Family History Family History  Problem Relation Age of Onset   Diabetes Father    Asthma Neg Hx  Cancer Neg Hx    Heart disease Neg Hx    Hypertension Neg Hx    Obesity Neg Hx    Stroke Neg Hx     Social History Social History   Tobacco Use   Smoking status: Never   Smokeless tobacco: Never  Vaping Use   Vaping status: Never Used  Substance Use Topics   Alcohol use: No   Drug use: No     Allergies   Patient has no known allergies.   Review of Systems Review of Systems  Eyes:  Positive for photophobia, redness, itching and visual disturbance. Negative for pain and discharge.     Physical Exam Triage Vital Signs ED Triage Vitals  Encounter Vitals Group     BP 05/27/23 1012 120/69     Systolic BP Percentile --      Diastolic BP Percentile --      Pulse Rate 05/27/23 1012 88     Resp 05/27/23 1012 16      Temp 05/27/23 1012 (!) 97.4 F (36.3 C)     Temp Source 05/27/23 1012 Oral     SpO2 05/27/23 1012 96 %     Weight 05/27/23 1016 206 lb (93.4 kg)     Height --      Head Circumference --      Peak Flow --      Pain Score 05/27/23 1015 0     Pain Loc --      Pain Education --      Exclude from Growth Chart --    No data found.  Updated Vital Signs BP 120/69 (BP Location: Left Arm)   Pulse 88   Temp (!) 97.4 F (36.3 C) (Oral)   Resp 16   Wt 206 lb (93.4 kg)   LMP 04/27/2023 (Exact Date) Comment: normally 5 days  SpO2 96%   Breastfeeding Yes   BMI 35.64 kg/m   Visual Acuity Right Eye Distance: 20/40 Left Eye Distance: 20/40 Bilateral Distance: 20/40  Right Eye Near:   Left Eye Near:    Bilateral Near:     Physical Exam Vitals and nursing note reviewed.  Constitutional:      Appearance: Normal appearance.  HENT:     Head: Normocephalic and atraumatic.     Right Ear: External ear normal.     Left Ear: External ear normal.     Nose: Nose normal.     Mouth/Throat:     Mouth: Mucous membranes are moist.  Eyes:     General: Lids are normal. Vision grossly intact. Gaze aligned appropriately.     Conjunctiva/sclera:     Right eye: Right conjunctiva is injected.     Left eye: Left conjunctiva is injected.     Pupils: Pupils are equal, round, and reactive to light.     Comments: Pupils dilated bilaterally at 8mm, constrict to 7mm with light.   Cardiovascular:     Rate and Rhythm: Normal rate.  Pulmonary:     Effort: Pulmonary effort is normal. No respiratory distress.  Musculoskeletal:        General: Normal range of motion.  Neurological:     General: No focal deficit present.     Mental Status: She is alert.  Psychiatric:        Mood and Affect: Mood normal.        Behavior: Behavior is cooperative.      UC Treatments / Results  Labs (all labs ordered are listed, but only  abnormal results are displayed) Labs Reviewed - No data to  display  EKG   Radiology No results found.  Procedures Procedures (including critical care time)  Medications Ordered in UC Medications - No data to display  Initial Impression / Assessment and Plan / UC Course  I have reviewed the triage vital signs and the nursing notes.  Pertinent labs & imaging results that were available during my care of the patient were reviewed by me and considered in my medical decision making (see chart for details).  Vitals and triage reviewed, patient is hemodynamically stable.  Bilateral pupil dilation 8 mm, pupils are equal, round, reactive to light and accommodation.  Suspect atropine use this caused the dilation, blurred vision and light sensitivity.  Previous symptoms consistent with allergic conjunctivitis, will trial Pataday drops.  Advise discontinuation of atropine.  Plan of care, follow-up care return precautions given, no questions at this time.     Final Clinical Impressions(s) / UC Diagnoses   Final diagnoses:  Allergic conjunctivitis of both eyes  Dilated pupil     Discharge Instructions      Wanafunzi wako wamepanuka kutokana na matumizi yako ya atropine. Hii inapaswa Sharlett Iles siku 3 zijazo, [place] vizuri na kupanuka kwa mwanafunzi kunaweza kudumu kwa hadi siku 10. Tafadhali usitumie tena atropine. Kwa kuwasha kwa jicho la pande mbili unaweza kutumia Pataday mara mbili kwa siku, tone 1 asubuhi, na tone 1 usiku kwa macho yote mawili.  Tafuta huduma ya haraka ikiwa utapata upotezaji wa [place], maumivu ya macho, au dalili zozote [place].  Your pupils are dilated due to your use of atropine.  This should improve over the next 3 days, blurred vision and pupil dilation may last for up to 10 days.  Please do not use any more of the atropine.  For your bilateral eye itching you can use the Pataday twice daily, 1 drop in the morning, and 1 drop at night to both eyes.  Seek immediate care if you develop any vision loss, eye pain, or any  new concerning symptoms.      ED Prescriptions     Medication Sig Dispense Auth. Provider   olopatadine (PATADAY) 0.1 % ophthalmic solution Place 1 drop into both eyes 2 (two) times daily. 5 mL Brycen Bean, [place] N, FNP      PDMP not reviewed this encounter.   Luella Gardenhire, [place] N, [place] 05/27/23 1040

## 2023-05-27 NOTE — ED Triage Notes (Signed)
Patient here today with c/o blurry vision since using Atropine eye drops yesterday. Patient states that she used the drops yesterday because it felt like something was in her eyes. The drops came from her daughter's house.

## 2023-06-02 ENCOUNTER — Telehealth: Payer: Self-pay | Admitting: Orthopedic Surgery

## 2023-06-02 NOTE — Telephone Encounter (Signed)
FYI.Marland KitchenMarland KitchenMarland KitchenMarland KitchenMarland KitchenCalled pt with interpreter on the line LVM stating we have RS appt to 3 on 10/21 and if she needs to RS please call. Provider will be in surgery that day and opened that afternoon for patients to be seen

## 2023-06-13 ENCOUNTER — Other Ambulatory Visit: Payer: Self-pay

## 2023-06-13 ENCOUNTER — Ambulatory Visit: Payer: Medicaid Other | Attending: Orthopedic Surgery | Admitting: Physical Therapy

## 2023-06-13 ENCOUNTER — Encounter: Payer: Self-pay | Admitting: Physical Therapy

## 2023-06-13 DIAGNOSIS — G8929 Other chronic pain: Secondary | ICD-10-CM | POA: Diagnosis not present

## 2023-06-13 DIAGNOSIS — M6281 Muscle weakness (generalized): Secondary | ICD-10-CM | POA: Diagnosis not present

## 2023-06-13 DIAGNOSIS — M533 Sacrococcygeal disorders, not elsewhere classified: Secondary | ICD-10-CM | POA: Diagnosis not present

## 2023-06-13 DIAGNOSIS — M5459 Other low back pain: Secondary | ICD-10-CM | POA: Insufficient documentation

## 2023-06-13 NOTE — Patient Instructions (Signed)
Access Code: [email or web address] URL: [email or web address] Date: 06/13/2023 Prepared by: Rosana Hoes  Exercises - Bridge with Resistance  - 1 x daily - 3 sets - 10 reps - Clam with Resistance  - 1 x daily - 3 sets - 10 reps

## 2023-06-13 NOTE — Therapy (Signed)
OUTPATIENT PHYSICAL THERAPY EVALUATION   Patient Name: Deborah Santana MRN: [id number] DOB:1986/01/31, 37 y.o., female Today's Date: 06/13/2023   END OF SESSION:  PT End of Session - 06/13/23 1549     Visit Number 1    Number of Visits 9    Date for PT Re-Evaluation 08/08/23    Authorization Type [employer]    PT Start Time 1610    PT Stop Time 1650    PT Time Calculation (min) 40 min    Activity Tolerance Patient tolerated treatment well    Behavior During Therapy WFL for tasks assessed/performed             Past Medical History:  Diagnosis Date   HSIL (high grade squamous intraepithelial lesion) on Pap smear of cervix    Low back pain    Medical history non-contributory    Obesity    Past Surgical History:  Procedure Laterality Date   COLPOSCOPY  02/26/2022   NO PAST SURGERIES     Patient Active Problem List   Diagnosis Date Noted   IUD (intrauterine device) in place 05/25/2023   High grade squamous intraepithelial lesion (HGSIL), grade 3 CIN, on biopsy of cervix 04/27/2023   Annual physical exam 03/16/2023   Chronic bilateral low back pain 09/15/2022   Obesity (BMI 35.0-39.9 without comorbidity) 09/15/2022   Normal labor 05/27/2022   Gestational hypertension 05/27/2022   Grand multipara 05/03/2022   Hx of shoulder dystocia in prior pregnancy, currently pregnant 04/01/2022   Poor compliance 03/11/2022   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 03/11/2022   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, antepartum 02/15/2022   Anti-M isoimmunization affecting pregnancy, 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   Supervision of other normal pregnancy, antepartum 12/31/2021   AMA (advanced maternal age) multigravida 35+, second trimester 12/31/2021   Anemia in pregnancy 11/16/2018   HSIL (high grade squamous intraepithelial lesion) on Pap smear of cervix 08/31/2016   Language barrier 08/27/2016    PCP: Donell Beers, FNP  REFERRING PROVIDER:  London Sheer, MD  REFERRING DIAG: Chronic SI joint pain  Rationale for Evaluation and Treatment: Rehabilitation  THERAPY DIAG:  Other low back pain  Muscle weakness (generalized)  ONSET DATE: Chronic   SUBJECTIVE:                                                                                                                                                                             SUBJECTIVE STATEMENT: In-person interpreter used Patient reports continued lower back pain that she was in therapy before. The therapy helped a little bit but after that it came back and she did have an injection with only short relief. The pain starts  at the lower back and can travel up her back to her shoulders. She also notes right sided hip tightness. She feels like the lower back is tight especially when she is bending forward. She does note pain also with lifting or standing extended periods of time.   PERTINENT HISTORY:  See PMH above  PAIN:  Are you having pain? Yes:  NPRS scale: 7-8/10 Pain location: Lower back Pain description: Tight  Aggravating factors: Bending, lifting, standing/walking longer than 10 minutes and sometimes when sitting down Relieving factors: Nothing  PRECAUTIONS: None  RED FLAGS: None   WEIGHT BEARING RESTRICTIONS: No  FALLS:  Has patient fallen in last 6 months? No  LIVING ENVIRONMENT: Lives with: lives with their family Lives in: House/apartment Stairs: Yes: Internal: flight steps; on right going up Has following equipment at home: None  PLOF: Independent  PATIENT GOALS: Pain relief   OBJECTIVE:  Note: Objective measures were completed at Evaluation unless otherwise noted. PATIENT SURVEYS:  Not assessed due to language barrier  COGNITION: Overall cognitive status: Within functional limits for tasks assessed     SENSATION: WFL  MUSCLE LENGTH: Hamstring grossly WFL  POSTURE:   Increased lumbar lordosis and anterior pelvic tilt, rounded  shoulder posture  PALPATION: Tender to palpation bilateral lumbar paraspinals and right gluteals  LUMBAR ROM:   AROM eval  Flexion 50%  Extension WFL  Right lateral flexion WFL  Left lateral flexion WFL  Right rotation 75%  Left rotation 75%   (Blank rows = not tested)  LOWER EXTREMITY ROM:      LE ROM grossly WFL  LOWER EXTREMITY MMT:    MMT Right eval Left eval  Hip flexion 4 4+  Hip extension 4- 4  Hip abduction 4- 4  Hip adduction    Hip internal rotation    Hip external rotation    Knee flexion 5 5  Knee extension 5 5  Ankle dorsiflexion    Ankle plantarflexion    Ankle inversion    Ankle eversion     (Blank rows = not tested)  Patient reports right hip/leg feels tired  LUMBAR SPECIAL TESTS:  Lumbar radicular testing negative  FUNCTIONAL TESTS:  5 times sit to stand: 13 seconds  Patient demonstrates poor lifting mechanics and reports increased pain  GAIT: Assistive device utilized: None Level of assistance: Complete Independence Comments: WFL   TODAY'S TREATMENT:       [facility] Adult PT Treatment:                                                DATE: 06/13/2023 Therapeutic Exercise: Bridge with green band at knees x 10 Side clamshell with green x 10  PATIENT EDUCATION:  Education details: Exam findings, POC, HEP Person educated: Patient Education method: Explanation, Demonstration, Tactile cues, Verbal cues, and Handouts Education comprehension: verbalized understanding, returned demonstration, verbal cues required, tactile cues required, and needs further education  HOME EXERCISE PROGRAM: Access Code: [email or web address]    ASSESSMENT: CLINICAL IMPRESSION: Patient is a 37 y.o. female who was seen today for physical therapy evaluation and treatment for chronic lower back pain. Currently she demonstrates limitations with lumbar motion with report of low back tightness, gross strength deficit of the core and hip musculature with poor posture control and  lifting mechanics that is likely contributing to her pain and functional limitations.   OBJECTIVE  IMPAIRMENTS: decreased activity tolerance, decreased ROM, decreased strength, impaired flexibility, improper body mechanics, postural dysfunction, and pain.   ACTIVITY LIMITATIONS: carrying, lifting, bending, sitting, standing, squatting, and locomotion level  PARTICIPATION LIMITATIONS: meal prep, cleaning, shopping, and community activity  PERSONAL FACTORS: Fitness, Past/current experiences, and Time since onset of injury/illness/exacerbation are also affecting patient's functional outcome.   REHAB POTENTIAL: Good  CLINICAL DECISION MAKING: Stable/uncomplicated  EVALUATION COMPLEXITY: Low   GOALS: Goals reviewed with patient? Yes  SHORT TERM GOALS: Target date: 07/11/2023  Patient will be I with initial HEP in order to progress with therapy. Baseline: HEP provided at eval Goal status: INITIAL  2.  Patient will report lower back pain </= 5/10 with bending in order to reduce functional limitations Baseline: 7-8/10 Goal status: INITIAL  3.  Patient will improve lumbar flexion AROM by 25% without an increase in pain to improve ability to complete reaching and bending activity.  Baseline: 50% Goal status: INITIAL   4.  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  Baseline: pain with lifting Goal status: INITIAL  LONG TERM GOALS: Target date: 08/08/2023  Patient will be I with final HEP to maintain progress from PT. Baseline: HEP provided at eval Goal status: INITIAL  2. Patient will demonstrate at least 4+/5 bilateral hip abductor and extensor strength to improve stability about the posterior chain with prolonged walking and standing activity.  Baseline: 4-/5 MMT Goal status: INITIAL   3.  Patient will tolerate at least 20 minutes of continuous standing activity to improve her  tolerance to household activities.  Baseline: 10 minutes Goal status: INITIAL   3.  Patient will complete 5 x STS in </= 9 seconds without an increase in pain to improve strength and functional mobility.  Baseline: 13 sec Goal status: INITIAL   5.  Patient will report lower back pain as </= 3/10 to reduce her current functional limitations.  Baseline: 7-8/10 Goal status: INITIAL   PLAN: PT FREQUENCY: 1x/week  PT DURATION: 8 weeks  PLANNED INTERVENTIONS: Therapeutic exercises, Therapeutic activity, Neuromuscular re-education, Balance training, Gait training, Patient/Family education, Self Care, Joint mobilization, Aquatic Therapy, Dry Needling, Spinal manipulation, Spinal mobilization, Cryotherapy, Moist heat, Taping, Manual therapy, and Re-evaluation.  PLAN FOR NEXT SESSION: Review HEP and progress PRN, progress core stabilization and hip strengthening as tolerated, lifting mechanics, lumbar stretching and mobility   Rosana Hoes, PT, DPT, LAT, ATC 06/13/23  4:59 PM Phone: [phone] Fax: [phone]    Check all possible CPT codes: 29562 - PT Re-evaluation, 97110- Therapeutic Exercise, [phone]- Neuro Re-education, [phone] - Gait Training, [phone] - Manual Therapy, 97530 - Therapeutic Activities, [phone] - Self Care, and [phone] - Aquatic therapy    Check all conditions that are expected to impact treatment: {Conditions expected to impact treatment:None of these apply   If treatment provided at initial evaluation, no treatment charged due to lack of authorization.

## 2023-06-30 ENCOUNTER — Other Ambulatory Visit: Payer: Self-pay

## 2023-06-30 ENCOUNTER — Ambulatory Visit: Payer: Medicaid Other | Attending: Orthopedic Surgery | Admitting: Physical Therapy

## 2023-06-30 ENCOUNTER — Encounter: Payer: Self-pay | Admitting: Physical Therapy

## 2023-06-30 DIAGNOSIS — M6281 Muscle weakness (generalized): Secondary | ICD-10-CM | POA: Insufficient documentation

## 2023-06-30 DIAGNOSIS — R293 Abnormal posture: Secondary | ICD-10-CM | POA: Diagnosis not present

## 2023-06-30 DIAGNOSIS — M5459 Other low back pain: Secondary | ICD-10-CM | POA: Diagnosis not present

## 2023-06-30 NOTE — Therapy (Signed)
OUTPATIENT PHYSICAL THERAPY TREATMENT   Patient Name: Deborah Santana MRN: [id number] DOB:1986-08-20, 37 y.o., female Today's Date: 06/30/2023   END OF SESSION:  PT End of Session - 06/30/23 1454     Visit Number 2    Number of Visits 9    Date for PT Re-Evaluation 08/08/23    Authorization Type [employer]    Authorization Time Period 06/27/2023 - 07/26/2023    Authorization - Visit Number 1    Authorization - Number of Visits 4    PT Start Time 1445    PT Stop Time 1523    PT Time Calculation (min) 38 min    Activity Tolerance Patient tolerated treatment well    Behavior During Therapy WFL for tasks assessed/performed              Past Medical History:  Diagnosis Date   HSIL (high grade squamous intraepithelial lesion) on Pap smear of cervix    Low back pain    Medical history non-contributory    Obesity    Past Surgical History:  Procedure Laterality Date   COLPOSCOPY  02/26/2022   NO PAST SURGERIES     Patient Active Problem List   Diagnosis Date Noted   IUD (intrauterine device) in place 05/25/2023   High grade squamous intraepithelial lesion (HGSIL), grade 3 CIN, on biopsy of cervix 04/27/2023   Annual physical exam 03/16/2023   Chronic bilateral low back pain 09/15/2022   Obesity (BMI 35.0-39.9 without comorbidity) 09/15/2022   Normal labor 05/27/2022   Gestational hypertension 05/27/2022   Grand multipara 05/03/2022   Hx of shoulder dystocia in prior pregnancy, currently pregnant 04/01/2022   Poor compliance 03/11/2022   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 03/11/2022   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, antepartum 02/15/2022   Anti-M isoimmunization affecting pregnancy, 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   Supervision of other normal pregnancy, antepartum 12/31/2021   AMA (advanced maternal age) multigravida 35+, second trimester 12/31/2021   Anemia in pregnancy 11/16/2018   HSIL (high grade squamous intraepithelial  lesion) on Pap smear of cervix 08/31/2016   Language barrier 08/27/2016    PCP: Donell Beers, FNP  REFERRING PROVIDER: London Sheer, MD  REFERRING DIAG: Chronic SI joint pain  Rationale for Evaluation and Treatment: Rehabilitation  THERAPY DIAG:  Other low back pain  Muscle weakness (generalized)  Abnormal posture  ONSET DATE: Chronic   SUBJECTIVE:                                                                                                                                                                             SUBJECTIVE STATEMENT: Patient reports her back is not bothering her much today.  PERTINENT HISTORY:  See PMH above  PAIN:  Are you having pain? Yes:  NPRS scale: 5/10 Pain location: Lower back Pain description: Tight  Aggravating factors: Bending, lifting, standing/walking longer than 10 minutes and sometimes when sitting down Relieving factors: Nothing  PRECAUTIONS: None  PATIENT GOALS: Pain relief   OBJECTIVE:  Note: Objective measures were completed at Evaluation unless otherwise noted. POSTURE:   Increased lumbar lordosis and anterior pelvic tilt, rounded shoulder posture  PALPATION: Tender to palpation bilateral lumbar paraspinals and right gluteals  LUMBAR ROM:   AROM eval  Flexion 50%  Extension WFL  Right lateral flexion WFL  Left lateral flexion WFL  Right rotation 75%  Left rotation 75%   (Blank rows = not tested)  LOWER EXTREMITY ROM:      LE ROM grossly WFL  LOWER EXTREMITY MMT:    MMT Right eval Left eval  Hip flexion 4 4+  Hip extension 4- 4  Hip abduction 4- 4  Hip adduction    Hip internal rotation    Hip external rotation    Knee flexion 5 5  Knee extension 5 5  Ankle dorsiflexion    Ankle plantarflexion    Ankle inversion    Ankle eversion     (Blank rows = not tested)  Patient reports right hip/leg feels tired  LUMBAR SPECIAL TESTS:  Lumbar radicular testing negative  FUNCTIONAL TESTS:   5 times sit to stand: 13 seconds  Patient demonstrates poor lifting mechanics and reports increased pain  GAIT: Assistive device utilized: None Level of assistance: Complete Independence Comments: WFL   TODAY'S TREATMENT:       [facility] Adult PT Treatment:                                                DATE: 06/30/2023 Therapeutic Exercise: Bridge with green band at knees 2 x 10 90-90 abdominal hold 2 x 5 with 10 sec hold Side clamshell with green 2 x 15 each Deadlift with 25# from 4" box 3 x 10 Pallof press with black 3 x 10 each   [facility] Adult PT Treatment:                                                DATE: 06/13/2023 Therapeutic Exercise: Bridge with green band at knees x 10 Side clamshell with green x 10  PATIENT EDUCATION:  Education details: HEP update Person educated: Patient Education method: Explanation, Demonstration, Tactile cues, Verbal cues, and Handouts Education comprehension: verbalized understanding, returned demonstration, verbal cues required, tactile cues required, and needs further education  HOME EXERCISE PROGRAM: Access Code: [email or web address]    ASSESSMENT: CLINICAL IMPRESSION: Patient tolerated therapy well with no adverse effects. Therapy focused on progressing core stabilization and hip strengthening with good tolerance. She did report mild lower back pain with lifting but was able to complete with curing for proper hip hinge lifting technique. Updated HEP to progress her core stabilization exercises for home. Patient would benefit from continued skilled PT to progress her strength in order to reduce pain and maximize functional ability.   OBJECTIVE IMPAIRMENTS: decreased activity tolerance, decreased ROM, decreased strength, impaired flexibility, improper body mechanics, postural dysfunction, and pain.   ACTIVITY LIMITATIONS: carrying, lifting,  bending, sitting, standing, squatting, and locomotion level  PARTICIPATION LIMITATIONS: meal prep, cleaning,  shopping, and community activity  PERSONAL FACTORS: Fitness, Past/current experiences, and Time since onset of injury/illness/exacerbation are also affecting patient's functional outcome.    GOALS: Goals reviewed with patient? Yes  SHORT TERM GOALS: Target date: 07/11/2023  Patient will be I with initial HEP in order to progress with therapy. Baseline: HEP provided at eval Goal status: INITIAL  2.  Patient will report lower back pain </= 5/10 with bending in order to reduce functional limitations Baseline: 7-8/10 Goal status: INITIAL  3.  Patient will improve lumbar flexion AROM by 25% without an increase in pain to improve ability to complete reaching and bending activity.  Baseline: 50% Goal status: INITIAL   4.  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  Baseline: pain with lifting Goal status: INITIAL  LONG TERM GOALS: Target date: 08/08/2023  Patient will be I with final HEP to maintain progress from PT. Baseline: HEP provided at eval Goal status: INITIAL  2. Patient will demonstrate at least 4+/5 bilateral hip abductor and extensor strength to improve stability about the posterior chain with prolonged walking and standing activity.  Baseline: 4-/5 MMT Goal status: INITIAL   3.  Patient will tolerate at least 20 minutes of continuous standing activity to improve her tolerance to household activities.  Baseline: 10 minutes Goal status: INITIAL   3.  Patient will complete 5 x STS in </= 9 seconds without an increase in pain to improve strength and functional mobility.  Baseline: 13 sec Goal status: INITIAL   5.  Patient will report lower back pain as </= 3/10 to reduce her current functional limitations.  Baseline: 7-8/10 Goal status: INITIAL   PLAN: PT FREQUENCY: 1x/week  PT DURATION: 8 weeks  PLANNED INTERVENTIONS: Therapeutic exercises, Therapeutic activity,  Neuromuscular re-education, Balance training, Gait training, Patient/Family education, Self Care, Joint mobilization, Aquatic Therapy, Dry Needling, Spinal manipulation, Spinal mobilization, Cryotherapy, Moist heat, Taping, Manual therapy, and Re-evaluation.  PLAN FOR NEXT SESSION: Review HEP and progress PRN, progress core stabilization and hip strengthening as tolerated, lifting mechanics, lumbar stretching and mobility   Rosana Hoes, PT, DPT, LAT, ATC 06/30/23  3:34 PM Phone: [phone] Fax: [phone]

## 2023-07-04 ENCOUNTER — Ambulatory Visit: Payer: Medicaid Other | Admitting: Orthopedic Surgery

## 2023-07-05 NOTE — Therapy (Signed)
OUTPATIENT PHYSICAL THERAPY TREATMENT   Patient Name: Deborah Santana MRN: [id number] DOB:Mar 12, 1986, 37 y.o., female Today's Date: 07/06/2023   END OF SESSION:  PT End of Session - 07/06/23 1616     Visit Number 3    Number of Visits 9    Date for PT Re-Evaluation 08/08/23    Authorization Type [employer]    Authorization Time Period 06/27/2023 - 07/26/2023    Authorization - Visit Number 2    Authorization - Number of Visits 4    PT Start Time 1615    PT Stop Time 1655    PT Time Calculation (min) 40 min    Activity Tolerance Patient tolerated treatment well    Behavior During Therapy WFL for tasks assessed/performed               Past Medical History:  Diagnosis Date   HSIL (high grade squamous intraepithelial lesion) on Pap smear of cervix    Low back pain    Medical history non-contributory    Obesity    Past Surgical History:  Procedure Laterality Date   COLPOSCOPY  02/26/2022   NO PAST SURGERIES     Patient Active Problem List   Diagnosis Date Noted   IUD (intrauterine device) in place 05/25/2023   High grade squamous intraepithelial lesion (HGSIL), grade 3 CIN, on biopsy of cervix 04/27/2023   Annual physical exam 03/16/2023   Chronic bilateral low back pain 09/15/2022   Obesity (BMI 35.0-39.9 without comorbidity) 09/15/2022   Normal labor 05/27/2022   Gestational hypertension 05/27/2022   Grand multipara 05/03/2022   Hx of shoulder dystocia in prior pregnancy, currently pregnant 04/01/2022   Poor compliance 03/11/2022   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 03/11/2022   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, antepartum 02/15/2022   Anti-M isoimmunization affecting pregnancy, 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   Supervision of other normal pregnancy, antepartum 12/31/2021   AMA (advanced maternal age) multigravida 35+, second trimester 12/31/2021   Anemia in pregnancy 11/16/2018   HSIL (high grade squamous intraepithelial  lesion) on Pap smear of cervix 08/31/2016   Language barrier 08/27/2016    PCP: Donell Beers, FNP  REFERRING PROVIDER: London Sheer, MD  REFERRING DIAG: Chronic SI joint pain  Rationale for Evaluation and Treatment: Rehabilitation  THERAPY DIAG:  Other low back pain  Muscle weakness (generalized)  Abnormal posture  ONSET DATE: Chronic   SUBJECTIVE:                                                                                                                                                                             SUBJECTIVE STATEMENT: Patient reports her back is not as tight today.  PERTINENT HISTORY:  See PMH above  PAIN:  Are you having pain? Yes:  NPRS scale: 3/10 Pain location: Lower back Pain description: Tight  Aggravating factors: Bending, lifting, standing/walking longer than 10 minutes and sometimes when sitting down Relieving factors: Nothing  PRECAUTIONS: None  PATIENT GOALS: Pain relief   OBJECTIVE:  Note: Objective measures were completed at Evaluation unless otherwise noted. POSTURE:   Increased lumbar lordosis and anterior pelvic tilt, rounded shoulder posture  PALPATION: Tender to palpation bilateral lumbar paraspinals and right gluteals  LUMBAR ROM:   AROM eval  Flexion 50%  Extension WFL  Right lateral flexion WFL  Left lateral flexion WFL  Right rotation 75%  Left rotation 75%   (Blank rows = not tested)  LOWER EXTREMITY ROM:      LE ROM grossly WFL  LOWER EXTREMITY MMT:    MMT Right eval Left eval Rt / Lt 07/06/2023  Hip flexion 4 4+   Hip extension 4- 4 4 / 4  Hip abduction 4- 4 4 / 4  Hip adduction     Hip internal rotation     Hip external rotation     Knee flexion 5 5   Knee extension 5 5   Ankle dorsiflexion     Ankle plantarflexion     Ankle inversion     Ankle eversion      (Blank rows = not tested)  Patient reports right hip/leg feels tired  LUMBAR SPECIAL TESTS:  Lumbar radicular  testing negative  FUNCTIONAL TESTS:  5 times sit to stand: 13 seconds  Patient demonstrates poor lifting mechanics and reports increased pain  GAIT: Assistive device utilized: None Level of assistance: Complete Independence Comments: WFL   TODAY'S TREATMENT:       [facility] Adult PT Treatment:                                                DATE: 07/06/2023 Therapeutic Exercise: NuStep L7 x 5 min with UE/LE to improve endurance 90-90 abdominal hold x 10 with 10 sec hold 90-90 alternating heel tap 6 x 10 Bridge 3 x 10 with 5 sec hold LTR 2 x 10 Side clamshell with green 2 x 20 each Deadlift with 30# 3 x 10 Standing shoulder extension with abdominal engagement using green 3 x 10   [facility] Adult PT Treatment:                                                DATE: 06/30/2023 Therapeutic Exercise: Bridge with green band at knees 2 x 10 90-90 abdominal hold 2 x 5 with 10 sec hold Side clamshell with green 2 x 15 each Deadlift with 25# from 4" box 3 x 10 Pallof press with black 3 x 10 each  [facility] Adult PT Treatment:                                                DATE: 06/13/2023 Therapeutic Exercise: Bridge with green band at knees x 10 Side clamshell with green x 10  PATIENT EDUCATION:  Education details: HEP update Person  educated: Patient Education method: Explanation, Demonstration, Tactile cues, Verbal cues, and Handouts Education comprehension: verbalized understanding, returned demonstration, verbal cues required, tactile cues required, and needs further education  HOME EXERCISE PROGRAM: Access Code: [email or web address]    ASSESSMENT: CLINICAL IMPRESSION: Patient tolerated therapy well with no adverse effects. Therapy continues to focus on core stabilization hip strengthening with good tolerance. She was able to progress her abdominal strengthening and control and increased weight with lifting this visit. No pain reported with therapy but she did report low back fatigue. Updated HEP to  progress core strengthening at home. Patient would benefit from continued skilled PT to progress her strength in order to reduce pain and maximize functional ability.   OBJECTIVE IMPAIRMENTS: decreased activity tolerance, decreased ROM, decreased strength, impaired flexibility, improper body mechanics, postural dysfunction, and pain.   ACTIVITY LIMITATIONS: carrying, lifting, bending, sitting, standing, squatting, and locomotion level  PARTICIPATION LIMITATIONS: meal prep, cleaning, shopping, and community activity  PERSONAL FACTORS: Fitness, Past/current experiences, and Time since onset of injury/illness/exacerbation are also affecting patient's functional outcome.    GOALS: Goals reviewed with patient? Yes  SHORT TERM GOALS: Target date: 07/11/2023  Patient will be I with initial HEP in order to progress with therapy. Baseline: HEP provided at eval Goal status: INITIAL  2.  Patient will report lower back pain </= 5/10 with bending in order to reduce functional limitations Baseline: 7-8/10 Goal status: INITIAL  3.  Patient will improve lumbar flexion AROM by 25% without an increase in pain to improve ability to complete reaching and bending activity.  Baseline: 50% Goal status: INITIAL   4.  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  Baseline: pain with lifting Goal status: INITIAL  LONG TERM GOALS: Target date: 08/08/2023  Patient will be I with final HEP to maintain progress from PT. Baseline: HEP provided at eval Goal status: INITIAL  2. Patient will demonstrate at least 4+/5 bilateral hip abductor and extensor strength to improve stability about the posterior chain with prolonged walking and standing activity.  Baseline: 4-/5 MMT Goal status: INITIAL   3.  Patient will tolerate at least 20 minutes of continuous standing activity to improve her tolerance to household  activities.  Baseline: 10 minutes Goal status: INITIAL   3.  Patient will complete 5 x STS in </= 9 seconds without an increase in pain to improve strength and functional mobility.  Baseline: 13 sec Goal status: INITIAL   5.  Patient will report lower back pain as </= 3/10 to reduce her current functional limitations.  Baseline: 7-8/10 Goal status: INITIAL   PLAN: PT FREQUENCY: 1x/week  PT DURATION: 8 weeks  PLANNED INTERVENTIONS: Therapeutic exercises, Therapeutic activity, Neuromuscular re-education, Balance training, Gait training, Patient/Family education, Self Care, Joint mobilization, Aquatic Therapy, Dry Needling, Spinal manipulation, Spinal mobilization, Cryotherapy, Moist heat, Taping, Manual therapy, and Re-evaluation.  PLAN FOR NEXT SESSION: Review HEP and progress PRN, progress core stabilization and hip strengthening as tolerated, lifting mechanics, lumbar stretching and mobility   Rosana Hoes, PT, DPT, LAT, ATC 07/06/23  4:56 PM Phone: [phone] Fax: [phone]

## 2023-07-06 ENCOUNTER — Ambulatory Visit: Payer: Medicaid Other | Admitting: Physical Therapy

## 2023-07-06 ENCOUNTER — Other Ambulatory Visit: Payer: Self-pay

## 2023-07-06 ENCOUNTER — Encounter: Payer: Self-pay | Admitting: Physical Therapy

## 2023-07-06 DIAGNOSIS — M6281 Muscle weakness (generalized): Secondary | ICD-10-CM | POA: Diagnosis not present

## 2023-07-06 DIAGNOSIS — M5459 Other low back pain: Secondary | ICD-10-CM | POA: Diagnosis not present

## 2023-07-06 DIAGNOSIS — R293 Abnormal posture: Secondary | ICD-10-CM | POA: Diagnosis not present

## 2023-07-06 NOTE — Patient Instructions (Signed)
Access Code: [email or web address] URL: [email or web address] Date: 07/06/2023 Prepared by: Rosana Hoes  Exercises - Bridge with Resistance  - 1 x daily - 3 sets - 10 reps - Clam with Resistance  - 1 x daily - 3 sets - 10 reps - Supine 90/90 Alternating Toe Touch  - 1 x daily - 3 sets - 10 reps - Supine Lower Trunk Rotation  - 1 x daily - 3 sets - 10 reps

## 2023-07-11 ENCOUNTER — Other Ambulatory Visit: Payer: Self-pay

## 2023-07-11 ENCOUNTER — Encounter: Payer: Self-pay | Admitting: Physical Therapy

## 2023-07-11 ENCOUNTER — Ambulatory Visit: Payer: Medicaid Other | Admitting: Physical Therapy

## 2023-07-11 DIAGNOSIS — R293 Abnormal posture: Secondary | ICD-10-CM | POA: Diagnosis not present

## 2023-07-11 DIAGNOSIS — M5459 Other low back pain: Secondary | ICD-10-CM | POA: Diagnosis not present

## 2023-07-11 DIAGNOSIS — M6281 Muscle weakness (generalized): Secondary | ICD-10-CM

## 2023-07-11 NOTE — Patient Instructions (Signed)
Access Code: [email or web address] URL: [email or web address] Date: 07/11/2023 Prepared by: Rosana Hoes  Exercises - Figure 4 Bridge  - 1 x daily - 3 sets - 10 reps - Clam with Resistance  - 1 x daily - 3 sets - 10 reps - Supine 90/90 Alternating Toe Touch  - 1 x daily - 3 sets - 10 reps - Supine Lower Trunk Rotation  - 1 x daily - 3 sets - 10 reps

## 2023-07-11 NOTE — Therapy (Signed)
OUTPATIENT PHYSICAL THERAPY TREATMENT   Patient Name: Deborah Santana MRN: [id number] DOB:01-30-1986, 37 y.o., female Today's Date: 07/11/2023   END OF SESSION:  PT End of Session - 07/11/23 1619     Visit Number 4    Number of Visits 9    Date for PT Re-Evaluation 08/08/23    Authorization Type [employer]    Authorization Time Period 06/27/2023 - 07/26/2023    Authorization - Visit Number 3    Authorization - Number of Visits 4    PT Start Time 1615    PT Stop Time 1655    PT Time Calculation (min) 40 min    Activity Tolerance Patient tolerated treatment well    Behavior During Therapy WFL for tasks assessed/performed                Past Medical History:  Diagnosis Date   HSIL (high grade squamous intraepithelial lesion) on Pap smear of cervix    Low back pain    Medical history non-contributory    Obesity    Past Surgical History:  Procedure Laterality Date   COLPOSCOPY  02/26/2022   NO PAST SURGERIES     Patient Active Problem List   Diagnosis Date Noted   IUD (intrauterine device) in place 05/25/2023   High grade squamous intraepithelial lesion (HGSIL), grade 3 CIN, on biopsy of cervix 04/27/2023   Annual physical exam 03/16/2023   Chronic bilateral low back pain 09/15/2022   Obesity (BMI 35.0-39.9 without comorbidity) 09/15/2022   Normal labor 05/27/2022   Gestational hypertension 05/27/2022   Grand multipara 05/03/2022   Hx of shoulder dystocia in prior pregnancy, currently pregnant 04/01/2022   Poor compliance 03/11/2022   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 03/11/2022   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, antepartum 02/15/2022   Anti-M isoimmunization affecting pregnancy, 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   Supervision of other normal pregnancy, antepartum 12/31/2021   AMA (advanced maternal age) multigravida 35+, second trimester 12/31/2021   Anemia in pregnancy 11/16/2018   HSIL (high grade squamous intraepithelial  lesion) on Pap smear of cervix 08/31/2016   Language barrier 08/27/2016    PCP: Donell Beers, FNP  REFERRING PROVIDER: London Sheer, MD  REFERRING DIAG: Chronic SI joint pain  Rationale for Evaluation and Treatment: Rehabilitation  THERAPY DIAG:  Other low back pain  Muscle weakness (generalized)  Abnormal posture  ONSET DATE: Chronic   SUBJECTIVE:                                                                                                                                                                             SUBJECTIVE STATEMENT: Patient reports she is doing well. She is  working on her exercises and they are helping.  PERTINENT HISTORY:  See PMH above  PAIN:  Are you having pain? Yes:  NPRS scale: 3/10 Pain location: Lower back Pain description: Tight  Aggravating factors: Bending, lifting, standing/walking longer than 10 minutes and sometimes when sitting down Relieving factors: Nothing  PRECAUTIONS: None  PATIENT GOALS: Pain relief   OBJECTIVE:  Note: Objective measures were completed at Evaluation unless otherwise noted. POSTURE:   Increased lumbar lordosis and anterior pelvic tilt, rounded shoulder posture  PALPATION: Tender to palpation bilateral lumbar paraspinals and right gluteals  LUMBAR ROM:   AROM eval 07/11/2023  Flexion 50% 75%  Extension WFL   Right lateral flexion WFL   Left lateral flexion WFL   Right rotation 75% WFL  Left rotation 75% WFL   (Blank rows = not tested)  LOWER EXTREMITY ROM:      LE ROM grossly WFL  LOWER EXTREMITY MMT:    MMT Right eval Left eval Rt / Lt 07/06/2023  Hip flexion 4 4+   Hip extension 4- 4 4 / 4  Hip abduction 4- 4 4 / 4  Hip adduction     Hip internal rotation     Hip external rotation     Knee flexion 5 5   Knee extension 5 5   Ankle dorsiflexion     Ankle plantarflexion     Ankle inversion     Ankle eversion      (Blank rows = not tested)  Patient reports right  hip/leg feels tired  LUMBAR SPECIAL TESTS:  Lumbar radicular testing negative  FUNCTIONAL TESTS:  5 times sit to stand: 13 seconds  Patient demonstrates poor lifting mechanics and reports increased pain  GAIT: Assistive device utilized: None Level of assistance: Complete Independence Comments: WFL   TODAY'S TREATMENT:       [facility] Adult PT Treatment:                                                DATE: 07/11/2023 Therapeutic Exercise: Recumbent bike L3 x 5 min while taking subjective LTR x 10 90-90 alternating heel tap 3 x 20 Figure-4 bridge 3 x 10 each Modified side plank on knees 3 x 20 sec each Sidelying hip abduction 3 x 15 each Deadlift with 30# 4 x 10 Standing shoulder extension with abdominal engagement using green 2 x 15 Row with black 2 x 15   [facility] Adult PT Treatment:                                                DATE: 07/06/2023 Therapeutic Exercise: NuStep L7 x 5 min with UE/LE to improve endurance 90-90 abdominal hold x 10 with 10 sec hold 90-90 alternating heel tap 6 x 10 Bridge 3 x 10 with 5 sec hold LTR 2 x 10 Side clamshell with green 2 x 20 each Deadlift with 30# 3 x 10 Standing shoulder extension with abdominal engagement using green 3 x 10  [facility] Adult PT Treatment:  DATE: 06/30/2023 Therapeutic Exercise: Bridge with green band at knees 2 x 10 90-90 abdominal hold 2 x 5 with 10 sec hold Side clamshell with green 2 x 15 each Deadlift with 25# from 4" box 3 x 10 Pallof press with black 3 x 10 each  [facility] Adult PT Treatment:                                                DATE: 06/13/2023 Therapeutic Exercise: Bridge with green band at knees x 10 Side clamshell with green x 10  PATIENT EDUCATION:  Education details: HEP update Person educated: Patient Education method: Explanation, Demonstration, Tactile cues, Verbal cues, and Handouts Education comprehension: verbalized understanding, returned  demonstration, verbal cues required, tactile cues required, and needs further education  HOME EXERCISE PROGRAM: Access Code: [email or web address]    ASSESSMENT: CLINICAL IMPRESSION: Patient tolerated therapy well with no adverse effects. Therapy continues to focus on progressing core stabilization and hip strengthening with good tolerance. She demonstrates improvement in lumbar motion and has achieved all STGs this visit. She was able to initiate plank progression and continues to progress well with her core and postural strengthening exercises. No pain reported with therapy but she does exhibit fatigue with exercises. Updated HEP to progress core and hip strengthening.  Patient would benefit from continued skilled PT to progress her strength in order to reduce pain and maximize functional ability.   OBJECTIVE IMPAIRMENTS: decreased activity tolerance, decreased ROM, decreased strength, impaired flexibility, improper body mechanics, postural dysfunction, and pain.   ACTIVITY LIMITATIONS: carrying, lifting, bending, sitting, standing, squatting, and locomotion level  PARTICIPATION LIMITATIONS: meal prep, cleaning, shopping, and community activity  PERSONAL FACTORS: Fitness, Past/current experiences, and Time since onset of injury/illness/exacerbation are also affecting patient's functional outcome.    GOALS: Goals reviewed with patient? Yes  SHORT TERM GOALS: Target date: 07/11/2023  Patient will be I with initial HEP in order to progress with therapy. Baseline: HEP provided at eval 07/11/2023: independent with initial HEP Goal status: MET  2.  Patient will report lower back pain </= 5/10 with bending in order to reduce functional limitations Baseline: 7-8/10 07/11/2023: 3/10 Goal status: MET  3.  Patient will improve lumbar flexion AROM by 25% without an increase in pain to improve ability to complete reaching and bending activity.  Baseline: 50% 07/11/2023: see above Goal status: MET    4.  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  Baseline: pain with lifting 07/11/2023: patient exhibits proper lifting mechanics Goal status: MET  LONG TERM GOALS: Target date: 08/08/2023  Patient will be I with final HEP to maintain progress from PT. Baseline: HEP provided at eval Goal status: INITIAL  2. Patient will demonstrate at least 4+/5 bilateral hip abductor and extensor strength to improve stability about the posterior chain with prolonged walking and standing activity.  Baseline: 4-/5 MMT Goal status: INITIAL   3.  Patient will tolerate at least 20 minutes of continuous standing activity to improve her tolerance to household activities.  Baseline: 10 minutes Goal status: INITIAL   3.  Patient will complete 5 x STS in </= 9 seconds without an increase in pain to improve strength and functional mobility.  Baseline: 13 sec Goal status: INITIAL   5.  Patient will report lower  back pain as </= 3/10 to reduce her current functional limitations.  Baseline: 7-8/10 Goal status: INITIAL   PLAN: PT FREQUENCY: 1x/week  PT DURATION: 8 weeks  PLANNED INTERVENTIONS: Therapeutic exercises, Therapeutic activity, Neuromuscular re-education, Balance training, Gait training, Patient/Family education, Self Care, Joint mobilization, Aquatic Therapy, Dry Needling, Spinal manipulation, Spinal mobilization, Cryotherapy, Moist heat, Taping, Manual therapy, and Re-evaluation.  PLAN FOR NEXT SESSION: Review HEP and progress PRN, progress core stabilization and hip strengthening as tolerated, lifting mechanics, lumbar stretching and mobility   Rosana Hoes, PT, DPT, LAT, ATC 07/11/23  4:56 PM Phone: [phone] Fax: [phone]

## 2023-07-18 ENCOUNTER — Encounter: Payer: Self-pay | Admitting: Physical Therapy

## 2023-07-18 ENCOUNTER — Other Ambulatory Visit: Payer: Self-pay

## 2023-07-18 ENCOUNTER — Ambulatory Visit: Payer: Medicaid Other | Attending: Orthopedic Surgery | Admitting: Physical Therapy

## 2023-07-18 DIAGNOSIS — M6281 Muscle weakness (generalized): Secondary | ICD-10-CM | POA: Insufficient documentation

## 2023-07-18 DIAGNOSIS — M5459 Other low back pain: Secondary | ICD-10-CM | POA: Diagnosis not present

## 2023-07-18 DIAGNOSIS — R293 Abnormal posture: Secondary | ICD-10-CM | POA: Insufficient documentation

## 2023-07-18 NOTE — Therapy (Signed)
OUTPATIENT PHYSICAL THERAPY TREATMENT  DISCHARGE   Patient Name: Deborah Santana MRN: [id number] DOB:1986-05-01, 37 y.o., female Today's Date: 07/18/2023   END OF SESSION:  PT End of Session - 07/18/23 1634     Visit Number 5    Number of Visits 9    Date for PT Re-Evaluation 08/08/23    Authorization Type [employer]    Authorization Time Period 06/27/2023 - 07/26/2023    Authorization - Visit Number 4    Authorization - Number of Visits 4    PT Start Time 1620    PT Stop Time 1700    PT Time Calculation (min) 40 min    Activity Tolerance Patient tolerated treatment well    Behavior During Therapy WFL for tasks assessed/performed                 Past Medical History:  Diagnosis Date   HSIL (high grade squamous intraepithelial lesion) on Pap smear of cervix    Low back pain    Medical history non-contributory    Obesity    Past Surgical History:  Procedure Laterality Date   COLPOSCOPY  02/26/2022   NO PAST SURGERIES     Patient Active Problem List   Diagnosis Date Noted   IUD (intrauterine device) in place 05/25/2023   High grade squamous intraepithelial lesion (HGSIL), grade 3 CIN, on biopsy of cervix 04/27/2023   Annual physical exam 03/16/2023   Chronic bilateral low back pain 09/15/2022   Obesity (BMI 35.0-39.9 without comorbidity) 09/15/2022   Normal labor 05/27/2022   Gestational hypertension 05/27/2022   Grand multipara 05/03/2022   Hx of shoulder dystocia in prior pregnancy, currently pregnant 04/01/2022   Poor compliance 03/11/2022   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 03/11/2022   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, antepartum 02/15/2022   Anti-M isoimmunization affecting pregnancy, 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   Supervision of other normal pregnancy, antepartum 12/31/2021   AMA (advanced maternal age) multigravida 35+, second trimester 12/31/2021   Anemia in pregnancy 11/16/2018   HSIL (high grade squamous  intraepithelial lesion) on Pap smear of cervix 08/31/2016   Language barrier 08/27/2016    PCP: Donell Beers, FNP  REFERRING PROVIDER: London Sheer, MD  REFERRING DIAG: Chronic SI joint pain  Rationale for Evaluation and Treatment: Rehabilitation  THERAPY DIAG:  Other low back pain  Muscle weakness (generalized)  Abnormal posture  ONSET DATE: Chronic   SUBJECTIVE:                                                                                                                                                                             SUBJECTIVE STATEMENT: Patient reports she is doing  well. Denies any pain this visit and reports consistency with her exercises.  PERTINENT HISTORY:  See PMH above  PAIN:  Are you having pain? Yes:  NPRS scale: 0/10 Pain location: Lower back Pain description: Tight  Aggravating factors: Bending, lifting, standing/walking longer than 10 minutes and sometimes when sitting down Relieving factors: Nothing  PRECAUTIONS: None  PATIENT GOALS: Pain relief   OBJECTIVE:  Note: Objective measures were completed at Evaluation unless otherwise noted. POSTURE:   Increased lumbar lordosis and anterior pelvic tilt, rounded shoulder posture  PALPATION: Tender to palpation bilateral lumbar paraspinals and right gluteals  LUMBAR ROM:   AROM eval 07/11/2023  Flexion 50% 75%  Extension WFL   Right lateral flexion WFL   Left lateral flexion WFL   Right rotation 75% WFL  Left rotation 75% WFL   (Blank rows = not tested)  LOWER EXTREMITY ROM:      LE ROM grossly [facility]  LOWER EXTREMITY MMT:    MMT Right eval Left eval Rt / Lt 07/06/2023 Rt / Lt 07/18/2023  Hip flexion 4 4+    Hip extension 4- 4 4 / 4 4+ / 4+  Hip abduction 4- 4 4 / 4 4+ / 4+  Hip adduction      Hip internal rotation      Hip external rotation      Knee flexion 5 5    Knee extension 5 5    Ankle dorsiflexion      Ankle plantarflexion      Ankle inversion       Ankle eversion       (Blank rows = not tested)  Patient reports right hip/leg feels tired  LUMBAR SPECIAL TESTS:  Lumbar radicular testing negative  FUNCTIONAL TESTS:  5 times sit to stand: 13 seconds  Patient demonstrates poor lifting mechanics and reports increased pain  GAIT: Assistive device utilized: None Level of assistance: Complete Independence Comments: WFL   TODAY'S TREATMENT:       [facility] Adult PT Treatment:                                                DATE: 07/18/2023 Therapeutic Exercise: Recumbent bike L3 x 5 min while taking subjective LTR x 10 Bridge 2 x 10 90-90 alternating heel tap 2 x 20 Figure-4 bridge x 10 each Sidelying hip abduction 2 x 15 each Deadlift with 30# 4 x 10 Standing shoulder extension with abdominal engagement using green 2 x 15 Row with black 2 x 15 Pallof press with black 2 x 10 each   [facility] Adult PT Treatment:                                                DATE: 07/11/2023 Therapeutic Exercise: Recumbent bike L3 x 5 min while taking subjective LTR x 10 90-90 alternating heel tap 3 x 20 Figure-4 bridge 3 x 10 each Modified side plank on knees 3 x 20 sec each Sidelying hip abduction 3 x 15 each Deadlift with 30# 4 x 10 Standing shoulder extension with abdominal engagement using green 2 x 15 Row with black 2 x 15  [facility] Adult PT Treatment:  DATE: 07/06/2023 Therapeutic Exercise: NuStep L7 x 5 min with UE/LE to improve endurance 90-90 abdominal hold x 10 with 10 sec hold 90-90 alternating heel tap 6 x 10 Bridge 3 x 10 with 5 sec hold LTR 2 x 10 Side clamshell with green 2 x 20 each Deadlift with 30# 3 x 10 Standing shoulder extension with abdominal engagement using green 3 x 10  [facility] Adult PT Treatment:                                                DATE: 06/30/2023 Therapeutic Exercise: Bridge with green band at knees 2 x 10 90-90 abdominal hold 2 x 5 with 10 sec hold Side  clamshell with green 2 x 15 each Deadlift with 25# from 4" box 3 x 10 Pallof press with black 3 x 10 each  [facility] Adult PT Treatment:                                                DATE: 06/13/2023 Therapeutic Exercise: Bridge with green band at knees x 10 Side clamshell with green x 10  PATIENT EDUCATION:  Education details: POC discharge, HEP Person educated: Patient Education method: Explanation Education comprehension: verbalized understanding  HOME EXERCISE PROGRAM: Access Code: [id number]    ASSESSMENT: CLINICAL IMPRESSION: Patient tolerated therapy well with no adverse effects. She demonstrates great improvement in therapy, exhibiting improved strength, lumbar motion, and reporting minimal low back pain. She has achieved all established goals and is independent with her HEP so will be formally discharged from PT.   OBJECTIVE IMPAIRMENTS: decreased activity tolerance, decreased ROM, decreased strength, impaired flexibility, improper body mechanics, postural dysfunction, and pain.   ACTIVITY LIMITATIONS: carrying, lifting, bending, sitting, standing, squatting, and locomotion level  PARTICIPATION LIMITATIONS: meal prep, cleaning, shopping, and community activity  PERSONAL FACTORS: Fitness, Past/current experiences, and Time since onset of injury/illness/exacerbation are also affecting patient's functional outcome.    GOALS: Goals reviewed with patient? Yes  SHORT TERM GOALS: Target date: 07/11/2023  Patient will be I with initial HEP in order to progress with therapy. Baseline: HEP provided at eval 07/11/2023: independent with initial HEP Goal status: MET  2.  Patient will report lower back pain </= 5/10 with bending in order to reduce functional limitations Baseline: 7-8/10 07/11/2023: 3/10 Goal status: MET  3.  Patient will improve lumbar flexion AROM by 25% without an increase in pain to improve ability to complete reaching and bending activity.  Baseline:  50% 07/11/2023: see above Goal status: MET   4.  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  Baseline: pain with lifting 07/11/2023: patient exhibits proper lifting mechanics Goal status: MET  LONG TERM GOALS: Target date: 08/08/2023  Patient will be I with final HEP to maintain progress from PT. Baseline: HEP provided at eval 07/18/2023: independent Goal status: MET  2. Patient will demonstrate at least 4+/5 bilateral hip abductor and extensor strength to improve stability about the posterior chain with prolonged walking and standing activity.  Baseline: 4-/5 MMT 07/18/2023: 4+/5 MMT Goal status: MET   3.  Patient will tolerate at least 20 minutes of continuous standing  activity to improve her tolerance to household activities.  Baseline: 10 minutes 07/18/2023: patient reports no limitation Goal status: MET   3.  Patient will complete 5 x STS in </= 9 seconds without an increase in pain to improve strength and functional mobility.  Baseline: 13 sec 07/18/2023: 9 seconds Goal status: MET   5.  Patient will report lower back pain as </= 3/10 to reduce her current functional limitations.  Baseline: 7-8/10 07/18/2023: 0/10 pain Goal status: MET   PLAN: PT FREQUENCY: 1x/week  PT DURATION: 8 weeks  PLANNED INTERVENTIONS: Therapeutic exercises, Therapeutic activity, Neuromuscular re-education, Balance training, Gait training, Patient/Family education, Self Care, Joint mobilization, Aquatic Therapy, Dry Needling, Spinal manipulation, Spinal mobilization, Cryotherapy, Moist heat, Taping, Manual therapy, and Re-evaluation.  PLAN FOR NEXT SESSION: NA - discharge   Rosana Hoes, PT, DPT, LAT, ATC 07/18/23  5:05 PM Phone: [phone] Fax: [phone]    PHYSICAL THERAPY DISCHARGE SUMMARY  Visits from Start of Care: 5  Current functional level related to goals / functional  outcomes: See above   Remaining deficits: See above   Education / Equipment: HEP   Patient agrees to discharge. Patient goals were met. Patient is being discharged due to being pleased with the current functional level.

## 2023-07-18 NOTE — Patient Instructions (Signed)
Access Code: [email or web address] URL: [email or web address] Date: 07/18/2023 Prepared by: Rosana Hoes  Exercises - Figure 4 Bridge  - 1 x daily - 3 sets - 10 reps - Clam with Resistance  - 1 x daily - 3 sets - 10 reps - Supine 90/90 Alternating Toe Touch  - 1 x daily - 3 sets - 10 reps - Supine Lower Trunk Rotation  - 1 x daily - 3 sets - 10 reps

## 2024-01-12 IMAGING — US US MFM OB DETAIL+14 WK
1 series · 12 of 28 positions shown · non-contrast
Comparison: none

[Series 1: us mfm ob detail+14 wk · 12 of 151 slices shown]
[im 6/151]
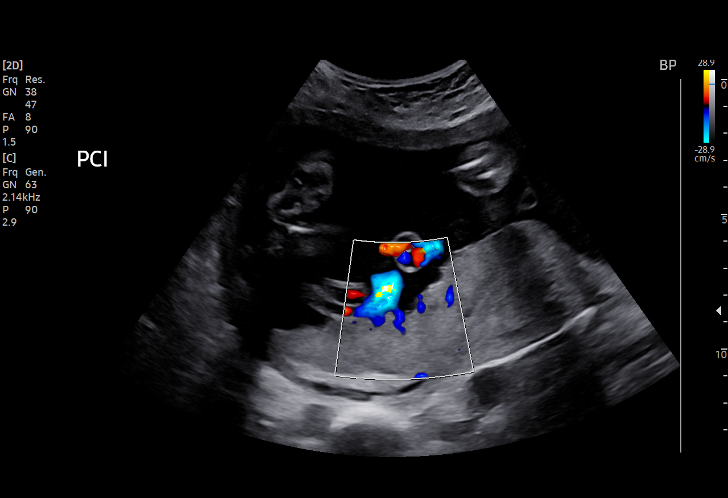
[im 17/151]
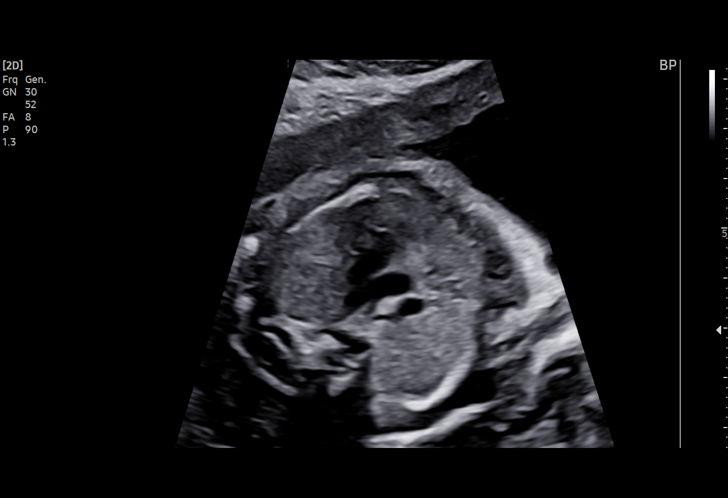
[im 28/151]
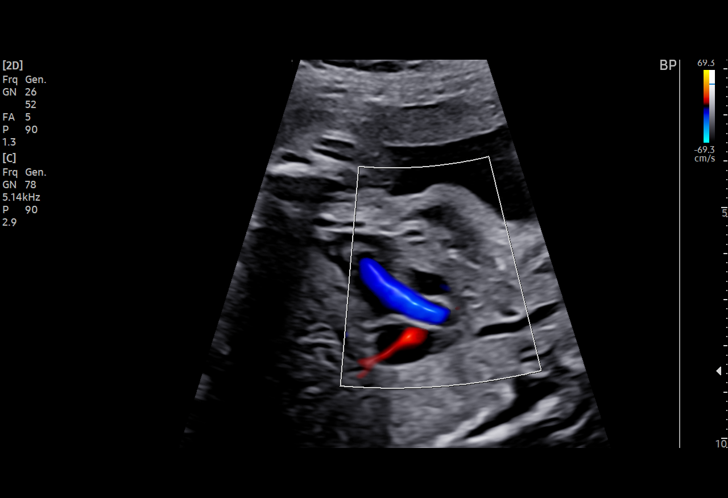
[im 45/151]
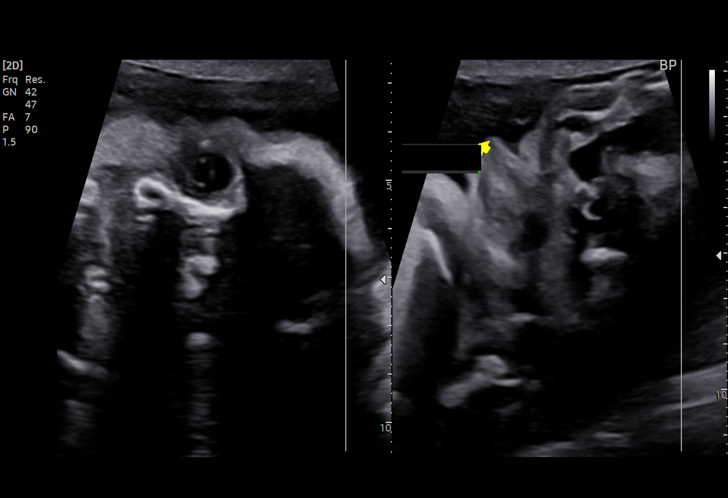
[im 56/151]
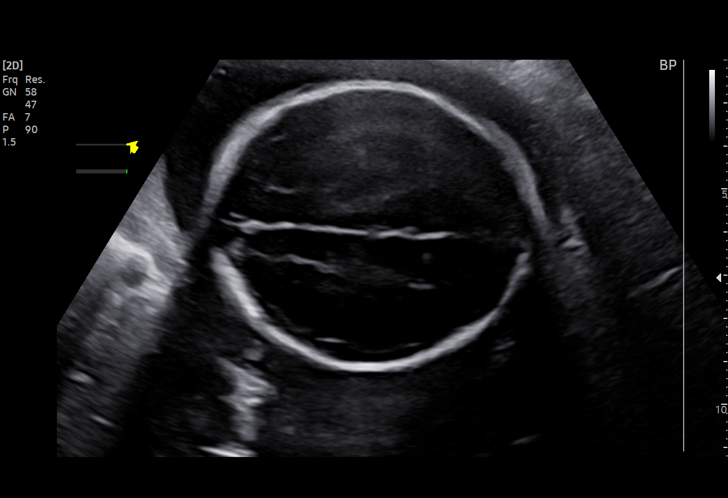
[im 67/151]
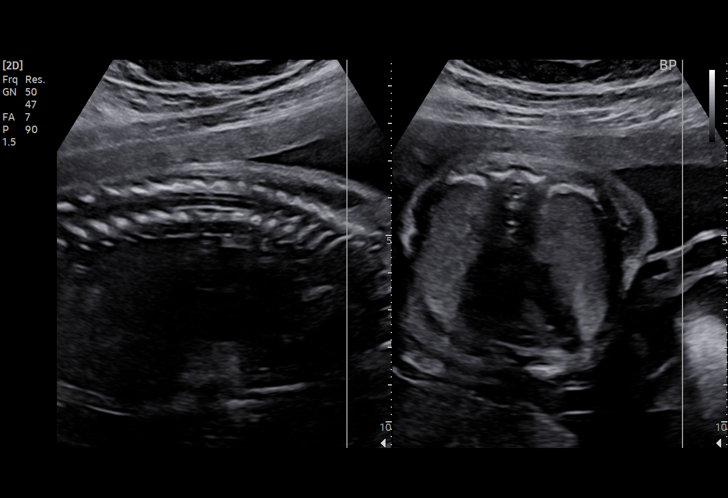
[im 84/151]
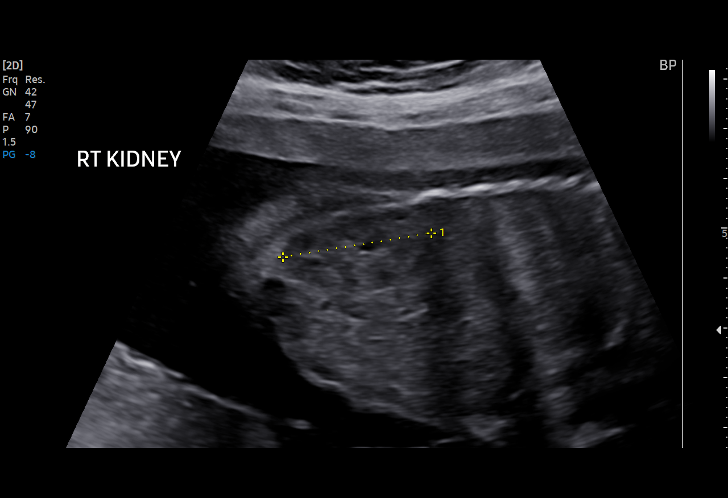
[im 95/151]
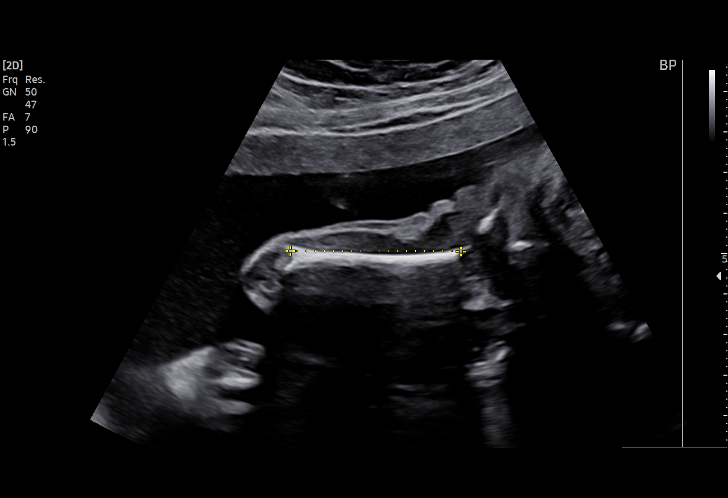
[im 106/151]
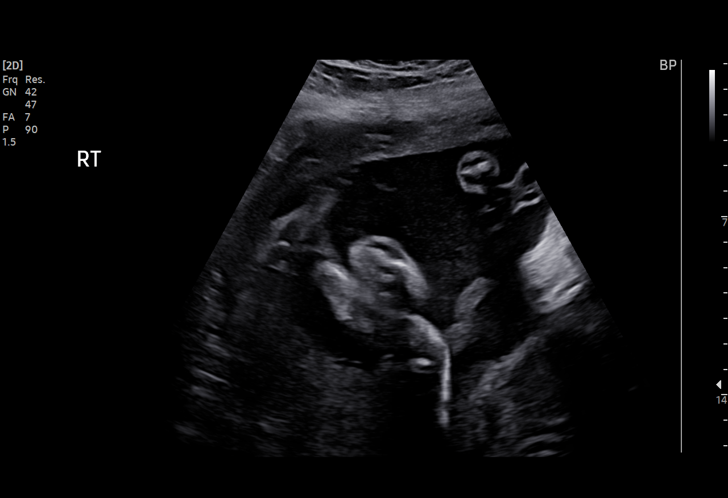
[im 123/151]
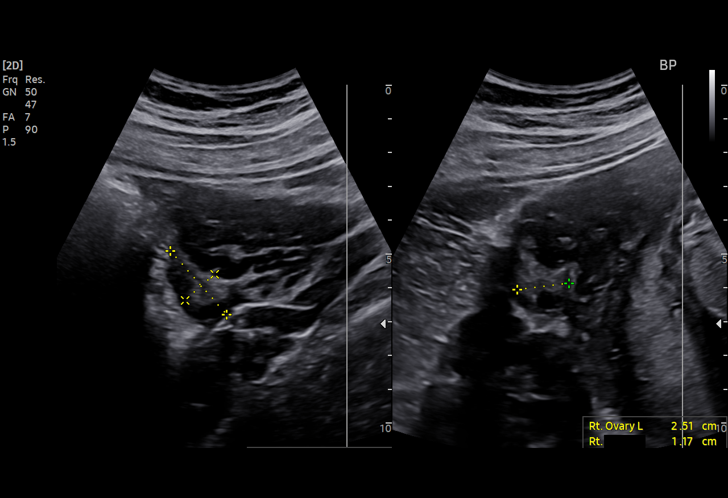
[im 134/151]
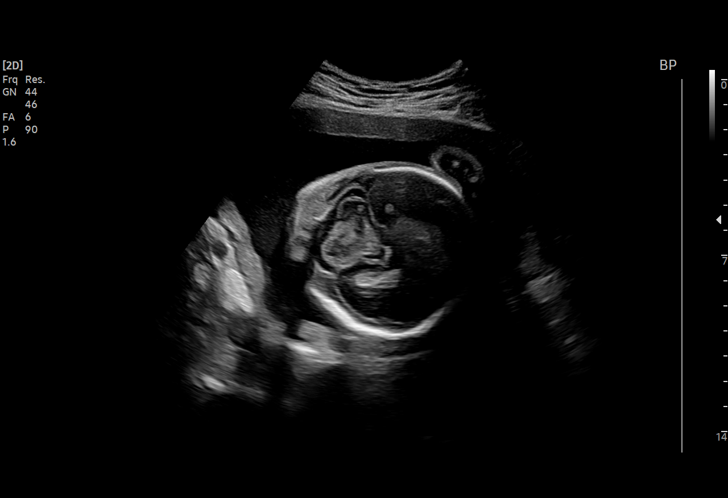
[im 145/151]
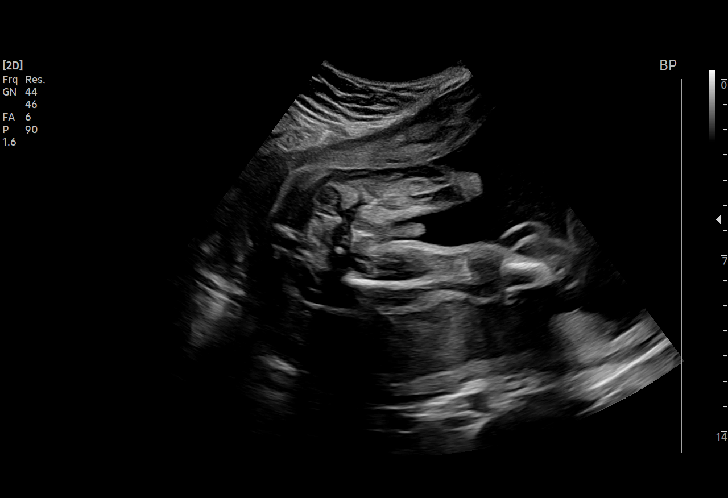

[12 of 28 positions shown; findings below may reference images not displayed]

Indications

 2 vessel umbilical cord
 Advanced maternal age multigravida 35+,
 second trimester (36 y.o)
 Grand multiparity, antepartum
 Genetic carrier (silent carrier for alpha
 thalassemia)
 Obesity complicating pregnancy, second
 trimester (BMI 36)
 LR NIPS
 25 weeks gestation of pregnancy
Fetal Evaluation

 Num Of Fetuses:         1
 Fetal Heart Rate(bpm):  146
 Cardiac Activity:       Observed
 Presentation:           Cephalic
 Placenta:               Posterior
 P. Cord Insertion:      Visualized, central

 Amniotic Fluid
 AFI FV:      Within normal limits

                             Largest Pocket(cm)

Biometry
 BPD:     65.76  mm     G. Age:  26w 4d         78  %    CI:        76.53   %    70 - 86
                                                         FL/HC:      19.6   %    18.7 -
 HC:    238.15   mm     G. Age:  25w 6d         43  %    HC/AC:      1.15        1.04 -
 AC:    206.38   mm     G. Age:  25w 1d         34  %    FL/BPD:     71.1   %    71 - 87
 FL:      46.78  mm     G. Age:  25w 4d         41  %    FL/AC:      22.7   %    20 - 24
 HUM:      42.4  mm     G. Age:  25w 3d         45  %
 CER:      30.3  mm     G. Age:  26w 3d         75  %

 LV:        3.9  mm
 CM:        5.8  mm

 Est. FW:     815  gm    1 lb 13 oz      41  %
OB History

 Blood Type:   B+
 Gravidity:    7         Term:   6
 Living:       6
Gestational Age

 U/S Today:     25w 6d                                        EDD:   05/25/22
 Best:          25w 3d     Det. By:  Previous Ultrasound      EDD:   05/28/22
                                     (12/22/21)
Anatomy

 Cranium:               Appears normal         Aortic Arch:            Appears normal
 Cavum:                 Appears normal         Ductal Arch:            Appears normal
 Ventricles:            Appears normal         Diaphragm:              Appears normal
 Choroid Plexus:        Appears normal         Stomach:                Appears normal, left
                                                                       sided
 Cerebellum:            Appears normal         Abdomen:                Appears normal
 Posterior Fossa:       Appears normal         Abdominal Wall:         Appears nml (cord
                                                                       insert, abd wall)
 Nuchal Fold:           Not applicable (>20    Cord Vessels:           2 Vessel Cord
                        wks GA)
 Face:                  Appears normal         Kidneys:                Appear normal
                        (orbits and profile)
 Lips:                  Appears normal         Bladder:                Appears normal
 Thoracic:              Appears normal         Spine:                  Limited views
                                                                       appear normal
 Heart:                 Appears normal         Upper Extremities:      Appears normal
                        (4CH, axis, and
                        situs)
 RVOT:                  Appears normal         Lower Extremities:      Appears normal
 LVOT:                  Appears normal

 Other:  Fetus appears to be a male. VC, 3VV and 3VTV visualized. Nasal
         bone, lenses, and falx visualized. Heels/feet and hands visualized.
         Technically difficult due to fetal position.
Cervix Uterus Adnexa

 Cervix
 Length:            3.8  cm.
 Normal appearance by transabdominal scan.
 Uterus
 Normal shape and size.

 Right Ovary
 Size(cm)     2.51   x   1.54   x  1.17      Vol(ml):
 Within normal limits.

 Left Ovary
 Size(cm)     2.95   x   2.44   x  1.99      Vol(ml):
 Within normal limits.

 Cul De Sac
 No free fluid seen.

 Adnexa
 No adnexal mass visualized.
Comments

 Nicklaas Mestilde was seen for a detailed fetal anatomy scan due to
 advanced maternal age and grand multiparity.
 She denies any significant past medical history and denies
 any problems in her current pregnancy.
 She had a cell free DNA test earlier in her pregnancy which
 indicated a low risk for trisomy 21, 18, and 13. A male fetus is
 predicted.
 She was informed that the fetal growth and amniotic fluid
 level were appropriate for her gestational age.
 A two-vessel umbilical cord was noted on today's exam.
 The implications and management of a two-vessel umbilical
 cord was discussed in detail with the patient today.  She was
 advised regarding the small association of trisomy 18 with a
 two-vessel cord.
 The patient was reassured that based on her negative cell
 free DNA test and as there were no other anomalies noted on
 today's exam, that it is highly unlikely that her baby will have
 trisomy 18.
 Due to the small possibility of trisomy 18, the patient was
 offered and declined an amniocentesis today for definitive
 diagnosis of fetal aneuploidy.
 The small risk of fetal growth issues later in her pregnancy
 due to the two-vessel cord was also discussed today.
 The patient was reassured that the two-vessel cord is most
 likely a normal variant and that her baby will most likely not
 be affected by this finding after delivery.
 The patient was informed that anomalies may be missed due
 to technical limitations. If the fetus is in a suboptimal position
 or maternal habitus is increased, visualization of the fetus in
 the maternal uterus may be impaired.
 Due to advanced maternal age and the two-vessel cord, the
 patient was offered and declined an amniocentesis for
 definitive diagnosis of fetal aneuploidy.
 Due to the two-vessel cord, the patient was also offered and
 declined a fetal echocardiogram.  She was comfortable with
 the fetal cardiac views that we obtained today.
 A follow-up exam was scheduled in 4 weeks.
 All conversations were held with the patient today with the
 help of a Swahili interpreter.
 A total of 30 minutes was spent counseling and coordinating
 the care for this patient.  Greater than 50% of the time was
 spent in direct face-to-face contact.

## 2024-03-19 ENCOUNTER — Ambulatory Visit: Payer: Self-pay | Admitting: Nurse Practitioner

## 2024-05-28 ENCOUNTER — Encounter: Payer: Self-pay | Admitting: Nurse Practitioner

## 2024-05-28 ENCOUNTER — Ambulatory Visit: Payer: Self-pay | Admitting: Nurse Practitioner
